# Patient Record
Sex: Female | Born: 1985
Health system: Southern US, Community
[De-identification: ages and names within clinical notes are randomized; demographics above are authoritative.]

## PROBLEM LIST (undated history)

## (undated) DIAGNOSIS — R569 Unspecified convulsions: Secondary | ICD-10-CM

## (undated) DIAGNOSIS — M2550 Pain in unspecified joint: Secondary | ICD-10-CM

## (undated) DIAGNOSIS — G4730 Sleep apnea, unspecified: Secondary | ICD-10-CM

## (undated) DIAGNOSIS — K5900 Constipation, unspecified: Secondary | ICD-10-CM

## (undated) DIAGNOSIS — Q858 Other phakomatoses, not elsewhere classified: Secondary | ICD-10-CM

## (undated) DIAGNOSIS — F319 Bipolar disorder, unspecified: Secondary | ICD-10-CM

## (undated) DIAGNOSIS — J45909 Unspecified asthma, uncomplicated: Secondary | ICD-10-CM

## (undated) DIAGNOSIS — F32A Depression, unspecified: Secondary | ICD-10-CM

## (undated) DIAGNOSIS — G43909 Migraine, unspecified, not intractable, without status migrainosus: Secondary | ICD-10-CM

## (undated) DIAGNOSIS — K219 Gastro-esophageal reflux disease without esophagitis: Secondary | ICD-10-CM

## (undated) DIAGNOSIS — K829 Disease of gallbladder, unspecified: Secondary | ICD-10-CM

## (undated) DIAGNOSIS — F329 Major depressive disorder, single episode, unspecified: Secondary | ICD-10-CM

## (undated) DIAGNOSIS — Q8589 Other phakomatoses, not elsewhere classified: Secondary | ICD-10-CM

## (undated) DIAGNOSIS — IMO0001 Reserved for inherently not codable concepts without codable children: Secondary | ICD-10-CM

## (undated) DIAGNOSIS — F419 Anxiety disorder, unspecified: Secondary | ICD-10-CM

## (undated) DIAGNOSIS — K760 Fatty (change of) liver, not elsewhere classified: Secondary | ICD-10-CM

## (undated) DIAGNOSIS — M7989 Other specified soft tissue disorders: Secondary | ICD-10-CM

## (undated) DIAGNOSIS — R600 Localized edema: Secondary | ICD-10-CM

## (undated) DIAGNOSIS — F4310 Post-traumatic stress disorder, unspecified: Secondary | ICD-10-CM

## (undated) DIAGNOSIS — F3181 Bipolar II disorder: Secondary | ICD-10-CM

## (undated) DIAGNOSIS — L509 Urticaria, unspecified: Secondary | ICD-10-CM

## (undated) HISTORY — DX: Bipolar II disorder: F31.81

## (undated) HISTORY — DX: Constipation, unspecified: K59.00

## (undated) HISTORY — DX: Post-traumatic stress disorder, unspecified: F43.10

## (undated) HISTORY — PX: OVARIAN CYST REMOVAL: SHX89

## (undated) HISTORY — PX: WISDOM TOOTH EXTRACTION: SHX21

## (undated) HISTORY — DX: Sleep apnea, unspecified: G47.30

## (undated) HISTORY — DX: Disease of gallbladder, unspecified: K82.9

## (undated) HISTORY — DX: Other specified soft tissue disorders: M79.89

## (undated) HISTORY — DX: Fatty (change of) liver, not elsewhere classified: K76.0

## (undated) HISTORY — DX: Bipolar disorder, unspecified: F31.9

## (undated) HISTORY — DX: Pain in unspecified joint: M25.50

## (undated) HISTORY — DX: Localized edema: R60.0

## (undated) HISTORY — PX: CHOLECYSTECTOMY: SHX55

---
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
## 2007-02-17 ENCOUNTER — Telehealth (INDEPENDENT_AMBULATORY_CARE_PROVIDER_SITE_OTHER): Payer: Self-pay | Admitting: *Deleted

## 2007-02-22 ENCOUNTER — Ambulatory Visit: Payer: Self-pay | Admitting: Family Medicine

## 2007-02-22 DIAGNOSIS — R109 Unspecified abdominal pain: Secondary | ICD-10-CM | POA: Insufficient documentation

## 2007-02-22 LAB — CONVERTED CEMR LAB
Ketones, urine, test strip: NEGATIVE
Nitrite: NEGATIVE
Protein, U semiquant: NEGATIVE

## 2007-03-27 ENCOUNTER — Encounter: Payer: Self-pay | Admitting: Family Medicine

## 2007-06-27 ENCOUNTER — Emergency Department (HOSPITAL_COMMUNITY): Admission: EM | Admit: 2007-06-27 | Discharge: 2007-06-27 | Payer: Self-pay | Admitting: Emergency Medicine

## 2007-08-15 ENCOUNTER — Telehealth (INDEPENDENT_AMBULATORY_CARE_PROVIDER_SITE_OTHER): Payer: Self-pay | Admitting: *Deleted

## 2007-08-15 ENCOUNTER — Emergency Department (HOSPITAL_COMMUNITY): Admission: EM | Admit: 2007-08-15 | Discharge: 2007-08-15 | Payer: Self-pay | Admitting: Emergency Medicine

## 2007-08-17 ENCOUNTER — Emergency Department (HOSPITAL_COMMUNITY): Admission: EM | Admit: 2007-08-17 | Discharge: 2007-08-17 | Payer: Self-pay | Admitting: Emergency Medicine

## 2007-08-17 ENCOUNTER — Telehealth (INDEPENDENT_AMBULATORY_CARE_PROVIDER_SITE_OTHER): Payer: Self-pay | Admitting: *Deleted

## 2007-08-18 ENCOUNTER — Inpatient Hospital Stay (HOSPITAL_COMMUNITY): Admission: EM | Admit: 2007-08-18 | Discharge: 2007-08-21 | Payer: Self-pay | Admitting: Neurology

## 2007-10-23 ENCOUNTER — Encounter: Payer: Self-pay | Admitting: Family Medicine

## 2007-12-04 ENCOUNTER — Telehealth (INDEPENDENT_AMBULATORY_CARE_PROVIDER_SITE_OTHER): Payer: Self-pay | Admitting: *Deleted

## 2007-12-05 ENCOUNTER — Encounter (INDEPENDENT_AMBULATORY_CARE_PROVIDER_SITE_OTHER): Payer: Self-pay | Admitting: *Deleted

## 2007-12-21 ENCOUNTER — Telehealth (INDEPENDENT_AMBULATORY_CARE_PROVIDER_SITE_OTHER): Payer: Self-pay | Admitting: *Deleted

## 2007-12-21 ENCOUNTER — Emergency Department (HOSPITAL_COMMUNITY): Admission: EM | Admit: 2007-12-21 | Discharge: 2007-12-21 | Payer: Self-pay | Admitting: Emergency Medicine

## 2008-01-10 ENCOUNTER — Encounter: Payer: Self-pay | Admitting: Family Medicine

## 2008-01-10 ENCOUNTER — Ambulatory Visit: Payer: Self-pay | Admitting: Family Medicine

## 2008-01-10 DIAGNOSIS — L509 Urticaria, unspecified: Secondary | ICD-10-CM | POA: Insufficient documentation

## 2008-01-11 ENCOUNTER — Other Ambulatory Visit: Admission: RE | Admit: 2008-01-11 | Discharge: 2008-01-11 | Payer: Self-pay | Admitting: Family Medicine

## 2008-01-12 ENCOUNTER — Encounter (INDEPENDENT_AMBULATORY_CARE_PROVIDER_SITE_OTHER): Payer: Self-pay | Admitting: *Deleted

## 2008-01-12 LAB — CONVERTED CEMR LAB

## 2008-01-15 ENCOUNTER — Telehealth (INDEPENDENT_AMBULATORY_CARE_PROVIDER_SITE_OTHER): Payer: Self-pay | Admitting: *Deleted

## 2008-01-15 ENCOUNTER — Ambulatory Visit: Payer: Self-pay | Admitting: Family Medicine

## 2008-01-15 ENCOUNTER — Encounter (INDEPENDENT_AMBULATORY_CARE_PROVIDER_SITE_OTHER): Payer: Self-pay | Admitting: *Deleted

## 2008-01-15 DIAGNOSIS — Q8589 Other phakomatoses, not elsewhere classified: Secondary | ICD-10-CM | POA: Insufficient documentation

## 2008-01-15 DIAGNOSIS — F70 Mild intellectual disabilities: Secondary | ICD-10-CM | POA: Insufficient documentation

## 2008-01-15 DIAGNOSIS — A5601 Chlamydial cystitis and urethritis: Secondary | ICD-10-CM

## 2008-01-15 DIAGNOSIS — Q858 Other phakomatoses, not elsewhere classified: Secondary | ICD-10-CM

## 2008-01-15 LAB — CONVERTED CEMR LAB
Alkaline Phosphatase: 50 units/L (ref 39–117)
Bilirubin, Direct: 0.1 mg/dL (ref 0.0–0.3)
Chloride: 108 meq/L (ref 96–112)
Eosinophils Absolute: 0.1 10*3/uL (ref 0.0–0.7)
GFR calc Af Amer: 119 mL/min
GFR calc non Af Amer: 98 mL/min
HCT: 39.9 % (ref 36.0–46.0)
HDL: 52.6 mg/dL (ref >39.0)
MCV: 84 fL (ref 78.0–100.0)
Monocytes Absolute: 0.4 10*3/uL (ref 0.1–1.0)
Neutrophils Relative %: 43.5 % (ref 43.0–77.0)
Platelets: 447 10*3/uL — ABNORMAL HIGH (ref 150–400)
Potassium: 3.9 meq/L (ref 3.5–5.1)
RDW: 13.8 % (ref 11.5–14.6)
Sodium: 142 meq/L (ref 135–145)
Total Bilirubin: 0.6 mg/dL (ref 0.3–1.2)
Total CHOL/HDL Ratio: 3.1
Triglycerides: 61 mg/dL (ref 0–149)
VLDL: 12 mg/dL (ref 0–40)

## 2008-01-26 ENCOUNTER — Encounter: Payer: Self-pay | Admitting: Family Medicine

## 2008-02-13 ENCOUNTER — Ambulatory Visit: Payer: Self-pay | Admitting: Family Medicine

## 2008-02-14 ENCOUNTER — Encounter (INDEPENDENT_AMBULATORY_CARE_PROVIDER_SITE_OTHER): Payer: Self-pay | Admitting: *Deleted

## 2008-02-14 LAB — CONVERTED CEMR LAB
Chlamydia, Swab/Urine, PCR: NEGATIVE
GC Probe Amp, Urine: NEGATIVE

## 2008-03-31 ENCOUNTER — Emergency Department (HOSPITAL_COMMUNITY): Admission: EM | Admit: 2008-03-31 | Discharge: 2008-03-31 | Payer: Self-pay | Admitting: Emergency Medicine

## 2008-04-22 ENCOUNTER — Ambulatory Visit: Payer: Self-pay | Admitting: Family Medicine

## 2008-04-23 LAB — CONVERTED CEMR LAB: IgE (Immunoglobulin E), Serum: 132.7 intl units/mL (ref 0.0–180.0)

## 2008-04-24 ENCOUNTER — Telehealth (INDEPENDENT_AMBULATORY_CARE_PROVIDER_SITE_OTHER): Payer: Self-pay | Admitting: *Deleted

## 2008-04-26 ENCOUNTER — Encounter (INDEPENDENT_AMBULATORY_CARE_PROVIDER_SITE_OTHER): Payer: Self-pay | Admitting: *Deleted

## 2008-04-26 LAB — CONVERTED CEMR LAB: IgE (Immunoglobulin E), Serum: 129.5 intl units/mL (ref 0.0–180.0)

## 2008-06-20 ENCOUNTER — Ambulatory Visit: Payer: Self-pay | Admitting: Family Medicine

## 2008-07-24 ENCOUNTER — Ambulatory Visit: Payer: Self-pay | Admitting: Family Medicine

## 2008-07-24 DIAGNOSIS — R51 Headache: Secondary | ICD-10-CM

## 2008-07-24 DIAGNOSIS — R519 Headache, unspecified: Secondary | ICD-10-CM | POA: Insufficient documentation

## 2008-07-24 DIAGNOSIS — F329 Major depressive disorder, single episode, unspecified: Secondary | ICD-10-CM

## 2008-07-24 DIAGNOSIS — F418 Other specified anxiety disorders: Secondary | ICD-10-CM | POA: Insufficient documentation

## 2008-08-16 ENCOUNTER — Telehealth: Payer: Self-pay | Admitting: Family Medicine

## 2008-08-16 ENCOUNTER — Emergency Department (HOSPITAL_COMMUNITY): Admission: EM | Admit: 2008-08-16 | Discharge: 2008-08-16 | Payer: Self-pay | Admitting: Emergency Medicine

## 2008-10-30 ENCOUNTER — Ambulatory Visit: Payer: Self-pay | Admitting: Family Medicine

## 2008-10-30 DIAGNOSIS — J019 Acute sinusitis, unspecified: Secondary | ICD-10-CM | POA: Insufficient documentation

## 2009-01-09 ENCOUNTER — Telehealth (INDEPENDENT_AMBULATORY_CARE_PROVIDER_SITE_OTHER): Payer: Self-pay | Admitting: *Deleted

## 2009-01-10 ENCOUNTER — Ambulatory Visit: Payer: Self-pay | Admitting: Family Medicine

## 2009-01-20 ENCOUNTER — Emergency Department (HOSPITAL_BASED_OUTPATIENT_CLINIC_OR_DEPARTMENT_OTHER): Admission: EM | Admit: 2009-01-20 | Discharge: 2009-01-20 | Payer: Self-pay | Admitting: Emergency Medicine

## 2009-01-20 ENCOUNTER — Ambulatory Visit: Payer: Self-pay | Admitting: Diagnostic Radiology

## 2009-01-31 ENCOUNTER — Emergency Department (HOSPITAL_BASED_OUTPATIENT_CLINIC_OR_DEPARTMENT_OTHER): Admission: EM | Admit: 2009-01-31 | Discharge: 2009-01-31 | Payer: Self-pay | Admitting: Emergency Medicine

## 2009-01-31 ENCOUNTER — Ambulatory Visit: Payer: Self-pay | Admitting: Interventional Radiology

## 2009-03-05 ENCOUNTER — Telehealth: Payer: Self-pay | Admitting: Family Medicine

## 2009-03-05 DIAGNOSIS — L259 Unspecified contact dermatitis, unspecified cause: Secondary | ICD-10-CM | POA: Insufficient documentation

## 2009-03-17 ENCOUNTER — Telehealth: Payer: Self-pay | Admitting: Family Medicine

## 2009-03-25 ENCOUNTER — Encounter: Payer: Self-pay | Admitting: Family Medicine

## 2009-06-03 ENCOUNTER — Emergency Department (HOSPITAL_BASED_OUTPATIENT_CLINIC_OR_DEPARTMENT_OTHER): Admission: EM | Admit: 2009-06-03 | Discharge: 2009-06-03 | Payer: Self-pay | Admitting: Emergency Medicine

## 2009-06-08 ENCOUNTER — Emergency Department (HOSPITAL_BASED_OUTPATIENT_CLINIC_OR_DEPARTMENT_OTHER): Admission: EM | Admit: 2009-06-08 | Discharge: 2009-06-08 | Payer: Self-pay | Admitting: Emergency Medicine

## 2009-06-09 ENCOUNTER — Telehealth (INDEPENDENT_AMBULATORY_CARE_PROVIDER_SITE_OTHER): Payer: Self-pay | Admitting: *Deleted

## 2009-06-10 ENCOUNTER — Other Ambulatory Visit: Admission: RE | Admit: 2009-06-10 | Discharge: 2009-06-10 | Payer: Self-pay | Admitting: Family Medicine

## 2009-06-10 ENCOUNTER — Ambulatory Visit: Payer: Self-pay | Admitting: Family Medicine

## 2009-06-10 DIAGNOSIS — G43009 Migraine without aura, not intractable, without status migrainosus: Secondary | ICD-10-CM | POA: Insufficient documentation

## 2009-06-10 LAB — CONVERTED CEMR LAB
Beta hcg, urine, semiquantitative: NEGATIVE
Bilirubin Urine: NEGATIVE
Blood in Urine, dipstick: NEGATIVE
Glucose, Urine, Semiquant: NEGATIVE
Ketones, urine, test strip: NEGATIVE
Nitrite: NEGATIVE
Protein, U semiquant: NEGATIVE
Specific Gravity, Urine: 1.02
Urobilinogen, UA: 0.2
WBC Urine, dipstick: NEGATIVE
pH: 7

## 2009-06-12 ENCOUNTER — Encounter: Admission: RE | Admit: 2009-06-12 | Discharge: 2009-06-12 | Payer: Self-pay | Admitting: Family Medicine

## 2009-06-16 ENCOUNTER — Encounter (INDEPENDENT_AMBULATORY_CARE_PROVIDER_SITE_OTHER): Payer: Self-pay | Admitting: *Deleted

## 2009-06-17 ENCOUNTER — Telehealth (INDEPENDENT_AMBULATORY_CARE_PROVIDER_SITE_OTHER): Payer: Self-pay | Admitting: *Deleted

## 2009-06-25 ENCOUNTER — Encounter (INDEPENDENT_AMBULATORY_CARE_PROVIDER_SITE_OTHER): Payer: Self-pay | Admitting: *Deleted

## 2009-08-07 ENCOUNTER — Ambulatory Visit: Payer: Self-pay | Admitting: Diagnostic Radiology

## 2009-08-07 ENCOUNTER — Emergency Department (HOSPITAL_BASED_OUTPATIENT_CLINIC_OR_DEPARTMENT_OTHER): Admission: EM | Admit: 2009-08-07 | Discharge: 2009-08-07 | Payer: Self-pay | Admitting: Emergency Medicine

## 2009-08-08 ENCOUNTER — Inpatient Hospital Stay (HOSPITAL_COMMUNITY): Admission: EM | Admit: 2009-08-08 | Discharge: 2009-08-10 | Payer: Self-pay | Admitting: Emergency Medicine

## 2009-08-09 ENCOUNTER — Encounter (INDEPENDENT_AMBULATORY_CARE_PROVIDER_SITE_OTHER): Payer: Self-pay | Admitting: Surgery

## 2009-09-09 ENCOUNTER — Telehealth: Payer: Self-pay | Admitting: Family Medicine

## 2009-09-10 ENCOUNTER — Emergency Department (HOSPITAL_BASED_OUTPATIENT_CLINIC_OR_DEPARTMENT_OTHER): Admission: EM | Admit: 2009-09-10 | Discharge: 2009-09-10 | Payer: Self-pay | Admitting: Emergency Medicine

## 2009-09-10 ENCOUNTER — Ambulatory Visit: Payer: Self-pay | Admitting: Family Medicine

## 2009-09-15 ENCOUNTER — Telehealth (INDEPENDENT_AMBULATORY_CARE_PROVIDER_SITE_OTHER): Payer: Self-pay | Admitting: *Deleted

## 2009-09-15 LAB — CONVERTED CEMR LAB: IgE (Immunoglobulin E), Serum: 67.9 intl units/mL (ref 0.0–180.0)

## 2009-10-14 ENCOUNTER — Emergency Department (HOSPITAL_BASED_OUTPATIENT_CLINIC_OR_DEPARTMENT_OTHER): Admission: EM | Admit: 2009-10-14 | Discharge: 2009-10-14 | Payer: Self-pay | Admitting: Emergency Medicine

## 2009-10-15 ENCOUNTER — Ambulatory Visit: Payer: Self-pay | Admitting: Family Medicine

## 2009-10-15 ENCOUNTER — Encounter (INDEPENDENT_AMBULATORY_CARE_PROVIDER_SITE_OTHER): Payer: Self-pay | Admitting: *Deleted

## 2009-10-15 DIAGNOSIS — R0609 Other forms of dyspnea: Secondary | ICD-10-CM

## 2009-10-15 DIAGNOSIS — R0989 Other specified symptoms and signs involving the circulatory and respiratory systems: Secondary | ICD-10-CM

## 2009-10-26 ENCOUNTER — Emergency Department (HOSPITAL_BASED_OUTPATIENT_CLINIC_OR_DEPARTMENT_OTHER): Admission: EM | Admit: 2009-10-26 | Discharge: 2009-10-27 | Payer: Self-pay | Admitting: Emergency Medicine

## 2009-10-30 ENCOUNTER — Ambulatory Visit: Payer: Self-pay | Admitting: Pulmonary Disease

## 2009-10-30 DIAGNOSIS — G4733 Obstructive sleep apnea (adult) (pediatric): Secondary | ICD-10-CM | POA: Insufficient documentation

## 2009-11-11 ENCOUNTER — Emergency Department (HOSPITAL_BASED_OUTPATIENT_CLINIC_OR_DEPARTMENT_OTHER): Admission: EM | Admit: 2009-11-11 | Discharge: 2009-11-11 | Payer: Self-pay | Admitting: Emergency Medicine

## 2009-11-12 ENCOUNTER — Ambulatory Visit: Payer: Self-pay | Admitting: Family Medicine

## 2009-12-03 ENCOUNTER — Ambulatory Visit (HOSPITAL_BASED_OUTPATIENT_CLINIC_OR_DEPARTMENT_OTHER): Admission: RE | Admit: 2009-12-03 | Discharge: 2009-12-03 | Payer: Self-pay | Admitting: Pulmonary Disease

## 2009-12-03 ENCOUNTER — Telehealth: Payer: Self-pay | Admitting: Family Medicine

## 2009-12-03 ENCOUNTER — Encounter: Payer: Self-pay | Admitting: Pulmonary Disease

## 2009-12-11 ENCOUNTER — Ambulatory Visit: Payer: Self-pay | Admitting: Family Medicine

## 2009-12-14 ENCOUNTER — Ambulatory Visit: Payer: Self-pay | Admitting: Pulmonary Disease

## 2009-12-15 ENCOUNTER — Telehealth (INDEPENDENT_AMBULATORY_CARE_PROVIDER_SITE_OTHER): Payer: Self-pay | Admitting: *Deleted

## 2009-12-15 ENCOUNTER — Emergency Department (HOSPITAL_BASED_OUTPATIENT_CLINIC_OR_DEPARTMENT_OTHER): Admission: EM | Admit: 2009-12-15 | Discharge: 2009-12-16 | Payer: Self-pay | Admitting: Emergency Medicine

## 2009-12-16 ENCOUNTER — Telehealth (INDEPENDENT_AMBULATORY_CARE_PROVIDER_SITE_OTHER): Payer: Self-pay | Admitting: *Deleted

## 2009-12-19 ENCOUNTER — Ambulatory Visit: Payer: Self-pay | Admitting: Pulmonary Disease

## 2010-01-01 ENCOUNTER — Ambulatory Visit: Payer: Self-pay | Admitting: Family Medicine

## 2010-01-14 ENCOUNTER — Ambulatory Visit: Payer: Self-pay | Admitting: Family Medicine

## 2010-01-14 DIAGNOSIS — K219 Gastro-esophageal reflux disease without esophagitis: Secondary | ICD-10-CM

## 2010-01-31 ENCOUNTER — Encounter: Payer: Self-pay | Admitting: Pulmonary Disease

## 2010-02-06 ENCOUNTER — Emergency Department (HOSPITAL_COMMUNITY): Admission: EM | Admit: 2010-02-06 | Discharge: 2010-02-06 | Payer: Self-pay | Admitting: Emergency Medicine

## 2010-03-23 ENCOUNTER — Emergency Department (HOSPITAL_BASED_OUTPATIENT_CLINIC_OR_DEPARTMENT_OTHER): Admission: EM | Admit: 2010-03-23 | Discharge: 2010-03-23 | Payer: Self-pay | Admitting: Emergency Medicine

## 2010-03-23 ENCOUNTER — Encounter: Payer: Self-pay | Admitting: Family Medicine

## 2010-04-19 ENCOUNTER — Encounter: Payer: Self-pay | Admitting: Pulmonary Disease

## 2010-04-30 ENCOUNTER — Ambulatory Visit: Payer: Self-pay | Admitting: Pulmonary Disease

## 2010-06-26 ENCOUNTER — Encounter: Payer: Self-pay | Admitting: Pulmonary Disease

## 2010-07-21 NOTE — Assessment & Plan Note (Signed)
Summary: rov for osa   Visit Type:  Follow-up Copy to:  Loreen Freud Primary Provider/Referring Provider:  Loreen Freud DO  CC:  follow up. pt states she uses her cpap eveyrnight x 7-8 hrs a night. Pt states her mask is tight on her face but she believes that is due to her braides. pt states after she uses her cpap she has dry mouth. .  History of Present Illness: the pt comes in today for f/u of her known osa.  She has not followed up with me since starting cpap, and a recent download from dme showed poor compliance.  She has not had her pressure optimized either.  She is having issues with mask fit, and also is c/o dryness in her nose and throat.  She was not aware of the heater on her humidifier, or how to adjust it.    Current Medications (verified): 1)  Ocella 3-0.03 Mg Tabs (Drospirenone-Ethinyl Estradiol) .... As Directed 2)  Effexor Xr 150 Mg Xr24h-Cap (Venlafaxine Hcl) .Marland Kitchen.. 1 By Mouth Once Daily 3)  Hydroxyzine Pamoate 25 Mg Caps (Hydroxyzine Pamoate) .... Take 1 By Mouth Qid As Needed. 4)  Topamax 50 Mg Tabs (Topiramate) .Marland Kitchen.. 1 By Mouth Two Times A Day 5)  Vicodin Es 7.5-750 Mg Tabs (Hydrocodone-Acetaminophen) .Marland Kitchen.. 1 By Mouth Every 6 Hours As Needed 6)  Pantoprazole Sodium 40 Mg Tbec (Pantoprazole Sodium) .... Once Daily 7)  Cetirizine Hcl 10 Mg Tabs (Cetirizine Hcl) .Marland Kitchen.. 1 Tab At Bedtime  Allergies (verified): 1)  ! Lidocaine-Epinephrine (Lidocaine-Epinephrine) 2)  ! Mepivacaine Hcl (Mepivacaine Hcl) 3)  ! Pcn 4)  ! Benadryl  Review of Systems       The patient complains of shortness of breath with activity and productive cough.  The patient denies shortness of breath at rest, non-productive cough, coughing up blood, chest pain, irregular heartbeats, acid heartburn, indigestion, loss of appetite, weight change, abdominal pain, difficulty swallowing, sore throat, tooth/dental problems, headaches, nasal congestion/difficulty breathing through nose, sneezing, itching, ear ache,  anxiety, depression, hand/feet swelling, joint stiffness or pain, rash, change in color of mucus, and fever.    Vital Signs:  Patient profile:   25 year old female Height:      64 inches Weight:      236.13 pounds BMI:     40.68 O2 Sat:      99 % on Room air Temp:     98.3 degrees F oral Pulse rate:   92 / minute BP sitting:   130 / 78  (left arm) Cuff size:   large  Vitals Entered By: Carver Fila (April 30, 2010 2:46 PM)  O2 Flow:  Room air CC: follow up. pt states she uses her cpap eveyrnight x 7-8 hrs a night. Pt states her mask is tight on her face but she believes that is due to her braides. pt states after she uses her cpap she has dry mouth.  Comments meds and allergies updated Phone number updated Carver Fila  April 30, 2010 2:47 PM    Physical Exam  General:  obese female in nad Nose:  no skin breakdown or pressure necrosis from cpap mask Extremities:  no edema or cyanosis  Neurologic:  alert, does not appear sleepy, moves all 4.   Impression & Recommendations:  Problem # 1:  OBSTRUCTIVE SLEEP APNEA (ICD-327.23) the pt is doing poorly with her cpap, and her recently download shows compliance less than 50%.  She is having issues with mask fit, lack of humidity  in the system, and a general poor understanding of the machine functions.  We have never optimized her pressure for her due to noncompliance with followup.  At this point, would like to have her go over to sleep center for mask fitting, and will also need some education about her machine from the DME.  Will use the auto mode on her machine to optimize pressure for her.  I have also asked her to work on aggressive weight loss.  Other Orders: Est. Patient Level III ([address]) Sleep Disorder Referral (Sleep Disorder) DME Referral (DME)  Patient Instructions: 1)  will have your mask fitted over at the sleep center during the day...please bring your current mask with you to the visit. 2)  will have your machine  flipped over to the automatic mode to optimize your pressure for you.  Will call you with results. 3)  increase the heat on your humidifier in order to help increase moisture. 4)  work on weight loss 5)  followup with me in 6mos.

## 2010-07-21 NOTE — Letter (Signed)
Summary: Patient No Show/[facility]  Patient No Show/[facility]   Imported By: Lanelle Bal 03/30/2010 14:03:14  _____________________________________________________________________  External Attachment:    Type:   Image     Comment:   External Document

## 2010-07-21 NOTE — Progress Notes (Signed)
Summary: Meds/Recheck?   Phone Note Call from Patient Call back at [phone]   Caller: Patient Summary of Call: Pt called and left a voicemail regarding her prescription stating she needed refills. Pt did not leave which medication. I called the pt back and had to leave a message. We were also never able to get intouch with pt about her pap results in December. Hopp advised then for [place] to prescribe Metronidazole 250 mg three times a day #21, avoid alcohol. When I get intouch with the pt should I call in the medication, or do you need to recheck? Please advise. Army Fossa CMA  September 09, 2009 11:23 AM   Follow-up for Phone Call        yes--call in med--- Follow-up by: Loreen Freud DO,  September 09, 2009 12:14 PM  Additional Follow-up for Phone Call Additional follow up Details #1::        I discussed with the pt and she need Fexofinadine 180 mg, sent the pharm. Also made the pt aware of her pap results and sent in meds. Army Fossa CMA  September 09, 2009 1:18 PM     New/Updated Medications: METRONIDAZOLE 250 MG TABS (METRONIDAZOLE) 1 by mouth three times a day, avoid alcohol. Prescriptions: METRONIDAZOLE 250 MG TABS (METRONIDAZOLE) 1 by mouth three times a day, avoid alcohol.  #21 x 0   Entered by:   Army Fossa CMA   Authorized by:   Loreen Freud DO   Signed by:   Army Fossa CMA on 09/09/2009   Method used:   Electronically to        [facility] [address] (retail)       [phone]       [place], [place]  [address]       Ph: [phone]       Fax: [phone]   RxID:   [id number]

## 2010-07-21 NOTE — Progress Notes (Signed)
Summary: lab results  Phone Note Outgoing Call   Call placed by: [facility] CMA,  September 15, 2009 3:29 PM Summary of Call: left message to call office, letter mailed .................Marland KitchenFelecia Deloach CMA  September 15, 2009 3:29 PM   Pt allergic to mold and [place] grass.  left message to call office...............Marland KitchenFelecia Deloach CMA  September 17, 2009 8:42 AM   Follow-up for Phone Call        pt aware labs mailed...........Marland KitchenFelecia Deloach CMA  September 17, 2009 10:05 AM

## 2010-07-21 NOTE — Assessment & Plan Note (Signed)
Summary: TROUBLE SLEEPING/RH........Marland Kitchen   Vital Signs:  Patient profile:   25 year old female Weight:      235 pounds Pulse rate:   90 / minute Pulse rhythm:   regular BP sitting:   122 / 80  (left arm) Cuff size:   large  Vitals Entered By: Army Fossa CMA (October 15, 2009 11:06 AM) CC: Pt here to discuss sleeping issues, cannot stay asleep wakes up because she is having trouble breathing.    History of Present Illness: Pt here c/o snoring and not sleeping well.  She periodically wakes up at night and this am her father had to wake her up because she stopped breathing.  Pt still getting periodic headaches.   No other complaints.    Preventive Screening-Counseling & Management  Alcohol-Tobacco     Alcohol drinks/day: 0     Smoking Status: never  Caffeine-Diet-Exercise     Caffeine use/day: 1     Does Patient Exercise: yes     Type of exercise: gym     Exercise (avg: min/session): >60     Times/week: 2  Current Medications (verified): 1)  Fexofenadine Hcl 180 Mg  Tabs (Fexofenadine Hcl) .... Take One Tablet Daily 2)  Cimetidine 400 Mg  Tabs (Cimetidine) .... Take One Tablet Daily Can Take Up To 3 Daily 3)  Ocella 3-0.03 Mg Tabs (Drospirenone-Ethinyl Estradiol) .... As Directed 4)  Cymbalta 60 Mg Cpep (Duloxetine Hcl) .Marland Kitchen.. 1 By Mouth Once Daily 5)  Abilify 5 Mg Tabs (Aripiprazole) .Marland Kitchen.. 1 By Mouth Once Daily**office Visit Due Now** 6)  Endocet 10-650 Mg Tabs (Oxycodone-Acetaminophen) .Marland Kitchen.. 1 By Mouth Every 6h  Allergies: 1)  ! Lidocaine-Epinephrine (Lidocaine-Epinephrine) 2)  ! Mepivacaine Hcl (Mepivacaine Hcl) 3)  ! Pcn 4)  ! Benadryl  Past History:  Past medical, surgical, family and social histories (including risk factors) reviewed for relevance to current acute and chronic problems.  Past Medical History: Reviewed history from 07/24/2008 and no changes required. 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 Had ophtho exam-no evid glaucoma - 12/25/2003  Family History: Reviewed history from 01/10/2008 and no changes required. Family History Diabetes  --MGM Family History Lung cancer MGM---copd--- worked in [employer] Family History High cholesterol Family History Hypertension  Social History: Reviewed history from 06/10/2009 and no changes required. Lives with mother, father, and younger sister. 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 Lungs:  Normal respiratory effort, chest expands symmetrically. Lungs are clear to auscultation, no crackles or wheezes. Heart:  Normal rate and regular rhythm. S1 and S2 normal without gallop, murmur, click, rub or other extra sounds. Extremities:  No clubbing, cyanosis, edema, or deformity noted with normal full range of motion of all joints.   Psych:  Cognition and judgment appear intact. Alert and cooperative with normal attention span and concentration. No apparent delusions, illusions, hallucinations   Impression & Recommendations:  Problem # 1:  SLEEP APNEA (ICD-780.57)  Orders: Sleep Disorder Referral (Sleep Disorder)  CPAP (cm  H20):   Mask Type:   Mask Size:   Problem # 2:  SNORING (ICD-786.09)  Orders: Sleep Disorder Referral (Sleep Disorder)  Recommended fluid and salt restriction.   Problem # 3:  MORBID OBESITY (ICD-278.01) Pt is eating  better and exercising interested in bariatric surgery  Orders: Nutrition Referral (Nutrition)  Ht: 60 (06/10/2009)   Wt: 235 (10/15/2009)   BMI: 45.87 (06/10/2009)  Complete Medication List: 1)  Fexofenadine Hcl 180 Mg Tabs (Fexofenadine hcl) .... Take one tablet daily 2)  Cimetidine 400 Mg Tabs (Cimetidine) .... Take one tablet daily can take up to 3 daily 3)  Ocella 3-0.03 Mg Tabs (Drospirenone-ethinyl estradiol) .... As directed 4)  Cymbalta 60 Mg Cpep (Duloxetine hcl) .Marland Kitchen.. 1 by mouth once daily 5)  Abilify 5 Mg Tabs (Aripiprazole) .Marland Kitchen.. 1 by mouth once daily**office visit due now** 6)  Endocet 10-650 Mg Tabs (Oxycodone-acetaminophen) .Marland Kitchen.. 1 by mouth every 6h

## 2010-07-21 NOTE — Progress Notes (Signed)
Summary: PRIOR AUTH  Phone Note Refill Request   Refills Requested: Medication #1:  PROTONIX 40MG  TAKE ONE TAB BY MOUTH EVERY Richelle Ito - [id number]  Initial call taken by: Okey Regal [place],  December 16, 2009 4:08 PM  Follow-up for Phone Call        prior auth approved as of 12-17-09, pharmacy notified, left pt detail message med approved rx ready to be pick-up at pharmacy..............Marland KitchenFelecia Deloach CMA  December 17, 2009 3:18 PM

## 2010-07-21 NOTE — Progress Notes (Signed)
Summary: REFILL  Phone Note Refill Request Message from:  Fax from Pharmacy on December 03, 2009 11:37 AM  Refills Requested: Medication #1:  HYDROXYZ HCL 25 ML TAKE ONE TAB BY MOUTH 4 TIMES DAILY AS NEEDED FOR Gina Cameron [id number]  Initial call taken by: Okey Regal [place],  December 03, 2009 11:47 AM  Follow-up for Phone Call        ok to refill x1  5 refills  Follow-up by: Loreen Freud DO,  December 03, 2009 12:09 PM    New/Updated Medications: HYDROXYZINE PAMOATE 25 MG CAPS (HYDROXYZINE PAMOATE) take 1 by mouth qid as needed. Prescriptions: HYDROXYZINE PAMOATE 25 MG CAPS (HYDROXYZINE PAMOATE) take 1 by mouth qid as needed.  #60 x 5   Entered by:   Army Fossa CMA   Authorized by:   Loreen Freud DO   Signed by:   Army Fossa CMA on 12/03/2009   Method used:   Electronically to        [facility] [address] (retail)       [phone]       [place], [place]  [address]       Ph: [phone]       Fax: [phone]   RxID:   [id number]

## 2010-07-21 NOTE — Assessment & Plan Note (Signed)
Summary: ov to discuss sleep study results/mg   Copy to:  Loreen Freud Primary Provider/Referring Provider:  Loreen Freud DO  CC:  Follow up to discuss sleep study results.  .  History of Present Illness: the pt comes in today for f/u of her recent sleep study.  She was found to have an AHI of 97/hr with desats as low as 72%.  I have reviewed the sleep study in detail with the pt and her father, and have answered all of their questions.    Current Medications (verified): 1)  Fexofenadine Hcl 180 Mg  Tabs (Fexofenadine Hcl) .... Take One Tablet Daily 2)  Cimetidine 400 Mg  Tabs (Cimetidine) .... Take One Tablet Daily Can Take Up To 3 Daily 3)  Ocella 3-0.03 Mg Tabs (Drospirenone-Ethinyl Estradiol) .... As Directed 4)  Effexor Xr 37.5 Mg Xr24h-Cap (Venlafaxine Hcl) .Marland Kitchen.. 1 By Mouth Once Daily For 1 Week Then 2 By Mouth Once Daily 5)  Hydroxyzine Pamoate 25 Mg Caps (Hydroxyzine Pamoate) .... Take 1 By Mouth Qid As Needed. 6)  Topamax 25 Mg Tabs (Topiramate) .Marland Kitchen.. 1 By Mouth At Bedtime For 1 Week Then 1 By Mouth Two Times A Day For 1 Week Then 1 By Mouth Qam and 2 By Mouth Qpm 7)  Vicodin Es 7.5-750 Mg Tabs (Hydrocodone-Acetaminophen) .Marland Kitchen.. 1 By Mouth Every 6 Hours As Needed  Allergies (verified): 1)  ! Lidocaine-Epinephrine (Lidocaine-Epinephrine) 2)  ! Mepivacaine Hcl (Mepivacaine Hcl) 3)  ! Pcn 4)  ! Benadryl  Review of Systems  The patient denies shortness of breath with activity, shortness of breath at rest, productive cough, non-productive cough, coughing up blood, chest pain, irregular heartbeats, acid heartburn, indigestion, loss of appetite, weight change, abdominal pain, difficulty swallowing, sore throat, tooth/dental problems, headaches, nasal congestion/difficulty breathing through nose, sneezing, itching, ear ache, anxiety, depression, hand/feet swelling, joint stiffness or pain, rash, change in color of mucus, and fever.    Vital Signs:  Patient profile:   25 year old  female Height:      64 inches Weight:      232 pounds BMI:     39.97 O2 Sat:      98 % on Room air Temp:     99.2 degrees F oral Pulse rate:   95 / minute BP sitting:   124 / 82  (left arm) Cuff size:   regular  Vitals Entered By: Gweneth Dimitri RN (December 19, 2009 3:18 PM)  O2 Flow:  Room air CC: Follow up to discuss sleep study results.   Comments Medications reviewed with patient Daytime contact number verified with patient. Gweneth Dimitri RN  December 19, 2009 3:19 PM    Physical Exam  General:  obese female in nad Nose:  no purulence or drainage noted. Extremities:  no edema or cyanosis Neurologic:  alert, but clearly sleepy, moves all 4.   Impression & Recommendations:  Problem # 1:  OBSTRUCTIVE SLEEP APNEA (ICD-327.23) the pt has very severe osa by her recent sleep study with significant oxygen desaturation.  I have reviewed the various treatment options, but feel that cpap with weight loss give her the best chance of success.  She is willing to give this a try.  I will set the patient up on cpap at a moderate pressure level to allow for desensitization, and will troubleshoot the device over the next 4-6weeks if needed.  The pt is to call me if having issues with tolerance.  Will then optimize the pressure once  patient is able to wear cpap on a consistent basis.  Other Orders: Est. Patient Level III (95621) DME Referral (DME)  Patient Instructions: 1)  will start on cpap at moderate pressure level.  Please call if having issues with tolerance. 2)  work on weight loss. 3)  followup with me in 5weeks.

## 2010-07-21 NOTE — Letter (Signed)
Summary: CMN/[facility]  CMN/[facility]   Imported By: Lester [place] 02/06/2010 09:41:19  _____________________________________________________________________  External Attachment:    Type:   Image     Comment:   External Document

## 2010-07-21 NOTE — Assessment & Plan Note (Signed)
Summary: consult for possible osa   Copy to:  Loreen Freud Primary Provider/Referring Provider:  Loreen Freud DO  CC:  Sleep Consult.  History of Present Illness: the pt is a 25y/o female who I have been asked to see for possible osa.  She has been noted to have loud snoring by her family members, as well as pauses in her breathing during sleep.  She also describes choking arousals.  She goes to bed at 11pm, and arises 5am to 1pm.  She is not rested upon arising.  She notes significant EDS with any period of inactivity, and will often doze off in class and snore.  She also dozes with tv watching, but denies sleepiness with driving.  Her epworth score today is 11, and her weight is up 20 pounds over the last 2 years.    Medications Prior to Update: 1)  Fexofenadine Hcl 180 Mg  Tabs (Fexofenadine Hcl) .... Take One Tablet Daily 2)  Cimetidine 400 Mg  Tabs (Cimetidine) .... Take One Tablet Daily Can Take Up To 3 Daily 3)  Ocella 3-0.03 Mg Tabs (Drospirenone-Ethinyl Estradiol) .... As Directed 4)  Cymbalta 60 Mg Cpep (Duloxetine Hcl) .Marland Kitchen.. 1 By Mouth Once Daily 5)  Abilify 5 Mg Tabs (Aripiprazole) .Marland Kitchen.. 1 By Mouth Once Daily**office Visit Due Now** 6)  Endocet 10-650 Mg Tabs (Oxycodone-Acetaminophen) .Marland Kitchen.. 1 By Mouth Every 6h  Allergies (verified): 1)  ! Lidocaine-Epinephrine (Lidocaine-Epinephrine) 2)  ! Mepivacaine Hcl (Mepivacaine Hcl) 3)  ! Pcn 4)  ! 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 Family History Diabetes  --MGM Family History Lung cancer MGM---copd--- worked in [employer] Family History High cholesterol Family History Hypertension allergies: mother, father, sisters, brothers  Social History: Reviewed history from 06/10/2009 and no changes required. Lives with mother, father, and younger sister. 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  The patient denies shortness of breath with activity, shortness of breath at rest, productive cough, non-productive cough, coughing up blood, chest pain, irregular heartbeats, acid heartburn, indigestion, loss of appetite, abdominal pain, difficulty swallowing, sore throat, tooth/dental problems, nasal congestion/difficulty breathing through nose, sneezing, itching, ear ache, anxiety, hand/feet swelling, joint stiffness or pain, rash, change in color of mucus, and fever.    Vital Signs:  Patient profile:   25 year old female Height:      64 inches Weight:      233 pounds BMI:     40.14 O2 Sat:      98 % on Room air Temp:     98.3 degrees F oral Pulse rate:   85 / minute BP sitting:   120 / 76  (right arm) Cuff size:   large  Vitals Entered By: Arman Filter LPN (Oct 30, 2009 2:09 PM)  O2 Flow:  Room air CC: Sleep Consult Comments Medications reviewed with patient Arman Filter LPN  Oct 30, 2009 2:16 PM    Physical Exam  General:  obese female in nad Eyes:  PERRLA and EOMI.   Nose:  mild turbinate hypertrophy, no obstruction Mouth:  large tonsils, elongation of soft palate and uvula with significant obstruction posteriorly Neck:  no jvd, tmg, LN Lungs:  clear to auscultation Heart:  rrr, no mrg Abdomen:  soft and nontender, bs+ Extremities:  no edema or cyanosis,pulses intact distally Neurologic:  alert and oriented, moves all  4. appears sleepy   Impression & Recommendations:  Problem # 1:  OBSTRUCTIVE SLEEP APNEA (ICD-327.23) the pt's history is very suggestive of significant osa.  She is overweight, has abnormal upper airway anatomy, and is significantly sleepy with inactivity.  I have had a long discussion with the pt about sleep apnea, including its impact on her QOL and CV health.  She needs a sleep study for diagnosis, and is agreeable.  I have reminded her of her moral responsibility to not drive if she is sleepy.  Other Orders: Consultation Level IV ([address]109) Sleep Disorder Referral (Sleep Disorder)  Patient Instructions: 1)  will schedule for sleep study, and arrange followup once results are available. 2)  work on weight loss

## 2010-07-21 NOTE — Assessment & Plan Note (Signed)
Summary: rto 1 month/cbs   Vital Signs:  Patient profile:   25 year old female Height:      64 inches Weight:      228 pounds Temp:     98.8 degrees F oral Pulse rate:   72 / minute BP sitting:   110 / 82  (left arm)  Vitals Entered By: Jeremy Johann CMA (January 14, 2010 11:16 AM) CC: 1 f/u month, refills, discuss sleep apnea, change med   History of Present Illness: Pt here to discuss depression.  She is doing better but feels she could still be better.  Pt is not suicidal.  Father is also present and they need refills on some meds and had ? about OSA.  Pt is still sleeping in day and up all night.   Father is concerned she is just "not getting it".    Current Medications (verified): 1)  Ocella 3-0.03 Mg Tabs (Drospirenone-Ethinyl Estradiol) .... As Directed 2)  Effexor Xr 150 Mg Xr24h-Cap (Venlafaxine Hcl) .Marland Kitchen.. 1 By Mouth Once Daily 3)  Hydroxyzine Pamoate 25 Mg Caps (Hydroxyzine Pamoate) .... Take 1 By Mouth Qid As Needed. 4)  Topamax 50 Mg Tabs (Topiramate) .Marland Kitchen.. 1 By Mouth Two Times A Day 5)  Vicodin Es 7.5-750 Mg Tabs (Hydrocodone-Acetaminophen) .Marland Kitchen.. 1 By Mouth Every 6 Hours As Needed 6)  Pantoprazole Sodium 40 Mg Tbec (Pantoprazole Sodium) .... Once Daily 7)  Cetirizine Hcl 10 Mg Tabs (Cetirizine Hcl) .Marland Kitchen.. 1 Tab At Bedtime  Allergies (verified): 1)  ! Lidocaine-Epinephrine (Lidocaine-Epinephrine) 2)  ! Mepivacaine Hcl (Mepivacaine Hcl) 3)  ! Pcn 4)  ! Benadryl  Past History:  Past medical, surgical, family and social histories (including risk factors) reviewed for relevance to current acute and chronic problems.  Past Medical History: Reviewed history from 10/30/2009 and no changes required. 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 Had ophtho exam-no evid glaucoma - 12/25/2003 cholecystectomy 2011 wisdom teeth extracted at age 46  Family History: Reviewed history from 10/30/2009 and no changes required. Family History Diabetes  --MGM Family History Lung cancer MGM---copd--- worked in [employer] Family History High cholesterol Family History Hypertension allergies: mother, father, sisters, brothers  Social History: Reviewed history from 10/30/2009 and no changes required. Lives with mother, father, and younger sister. 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 Lungs are clear to auscultation, no crackles or wheezes. Heart:  Normal rate and regular rhythm. S1 and S2 normal without gallop, murmur, click, rub or other extra sounds. Skin:  Intact without suspicious lesions or rashes Psych:  Oriented X3, memory intact for recent and remote, normally interactive, good eye contact, not anxious appearing, not depressed appearing, and not suicidal.     Impression & Recommendations:  Problem # 1:  OBSTRUCTIVE SLEEP APNEA (ICD-327.23) cpap per pulm encouraged pt to sleep at night and stay awake during day encouraged exercise and diet  Problem # 2:  MORBID OBESITY (ICD-278.01)  Ht: 64 (01/14/2010)   Wt: 228 (01/14/2010)   BMI:  39.97 (12/19/2009)  Problem # 3:  COMMON MIGRAINE (ICD-346.10)  con't topamax  Her updated medication list for this problem includes:    Vicodin Es 7.5-750 Mg Tabs (Hydrocodone-acetaminophen) .Marland Kitchen... 1 by mouth every 6 hours as needed  Problem # 4:  DEPRESSION (ICD-311)  Her  updated medication list for this problem includes:    Effexor Xr 150 Mg Xr24h-cap (Venlafaxine hcl) .Marland Kitchen... 1 by mouth once daily    Hydroxyzine Pamoate 25 Mg Caps (Hydroxyzine pamoate) .Marland Kitchen... Take 1 by mouth qid as needed.  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 As directed 2)  Effexor Xr 150 Mg Xr24h-cap (Venlafaxine hcl) .Marland Kitchen.. 1 by mouth once daily 3)  Hydroxyzine Pamoate 25 Mg Caps (Hydroxyzine pamoate) .... Take 1 by mouth qid as needed. 4)  Topamax 50 Mg Tabs (Topiramate) .Marland Kitchen.. 1 by mouth two times a day 5)  Vicodin Es 7.5-750 Mg Tabs (Hydrocodone-acetaminophen) .Marland Kitchen.. 1 by mouth every 6 hours as needed 6)  Pantoprazole Sodium 40 Mg Tbec (Pantoprazole sodium) .... Once daily 7)  Cetirizine Hcl 10 Mg Tabs (Cetirizine hcl) .Marland Kitchen.. 1 tab at bedtime  Patient Instructions: 1)  Please schedule a follow-up appointment in 1 month.  Prescriptions: TOPAMAX 50 MG TABS (TOPIRAMATE) 1 by mouth two times a day  #60 x 5   Entered and Authorized by:   Loreen Freud DO   Signed by:   Loreen Freud DO on 01/14/2010   Method used:   Electronically to        [facility] [address] (retail)       [phone]       [place], [place]  [address]       Ph: [phone]       Fax: [phone]   RxID:   [id number] EFFEXOR XR 150 MG XR24H-CAP (VENLAFAXINE HCL) 1 by mouth once daily  #30 x 2   Entered and Authorized by:   Loreen Freud DO   Signed by:   Loreen Freud DO on 01/14/2010   Method used:   Electronically to        [facility] [address] (retail)       [phone]       [place], [place]  [address]       Ph: [phone]       Fax: [phone]   RxID:   [id number] PANTOPRAZOLE SODIUM 40 MG TBEC (PANTOPRAZOLE SODIUM) once daily  #30 x 11   Entered and Authorized by:   Loreen Freud DO   Signed by:   Loreen Freud  DO on 01/14/2010   Method used:   Electronically to        [facility] [address] (retail)       [phone]       [place], [place]  [address]       Ph: [phone]       Fax: [phone]   RxID:   [id number]

## 2010-07-21 NOTE — Miscellaneous (Signed)
  Clinical Lists Changes  Recent download shows poor compliance, with 35/89 days not worn. needs ov since never followed up once cpap started.  Appended Document:  pt needs ov with me to f/u her osa and cpap  Appended Document:  pt mother scheduled apt for 11/9 at 9:00

## 2010-07-21 NOTE — Assessment & Plan Note (Signed)
Summary: weaps  all over her body/kdc   Vital Signs:  Patient profile:   25 year old female Weight:      237 pounds Temp:     98.9 degrees F oral Pulse rate:   88 / minute Pulse rhythm:   regular BP sitting:   122 / 80  (left arm) Cuff size:   large  Vitals Entered By: Army Fossa CMA (September 10, 2009 10:50 AM) CC: Pt here because she states she has hives on her body, her medication is not working.    Allergies: 1)  ! Lidocaine-Epinephrine (Lidocaine-Epinephrine) 2)  ! Mepivacaine Hcl (Mepivacaine Hcl) 3)  ! Pcn 4)  ! Benadryl  Physical Exam  General:  Well-developed,well-nourished,in no acute distress; alert,appropriate and cooperative throughout examination Skin:  + hyperpigmentation bend of elbows no hives together Psych:  Oriented X3 and normally interactive.     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 Take one tablet daily 2)  Cimetidine 400 Mg Tabs (Cimetidine) .... Take one tablet daily can take up to 3 daily 3)  Ocella 3-0.03 Mg Tabs (Drospirenone-ethinyl estradiol) .... As directed 4)  Cymbalta 60 Mg Cpep (Duloxetine hcl) .Marland Kitchen.. 1 by mouth once daily 5)  Abilify 5 Mg Tabs (Aripiprazole) .Marland Kitchen.. 1 by mouth once daily**office visit due now** 6)  Endocet 10-650 Mg Tabs (Oxycodone-acetaminophen) .Marland Kitchen.. 1 by mouth every 6h 7)  Topamax 50 Mg Tabs (Topiramate) .Marland Kitchen.. 1 by mouth two times a day 8)  Metronidazole 250 Mg Tabs (Metronidazole) .Marland Kitchen.. 1 by mouth three times a day, avoid alcohol. 9)  Zyrtec Allergy 10 Mg Tabs (Cetirizine hcl) .Marland Kitchen.. 1 by mouth at bedtime as needed Prescriptions: ZYRTEC ALLERGY 10 MG TABS (CETIRIZINE HCL) 1 by mouth at bedtime as needed  #30  x 11   Entered and Authorized by:   Loreen Freud DO   Signed by:   Loreen Freud DO on 09/10/2009   Method used:   Electronically to        [facility] [address] (retail)       [phone]       [place], [place]  [address]       Ph: [phone]       Fax: [phone]   RxID:   [id number]

## 2010-07-21 NOTE — Progress Notes (Signed)
Summary: need to sched ov with kc   ---- Converted from flag ---- ---- 12/15/2009 12:16 PM, Arman Filter LPN wrote: pt needs ov with kc to discuss sleep study results. ------------------------------  [id number].  Aundra Millet Berneice Zettlemoyer LPN  December 15, 2009 12:16 PM   called and spoke with pt's mother.  mother scheduled pt to see kc for a f/u on 12-19-2009 at 3:15pm  Arman Filter LPN  December 17, 2009 10:33 AM

## 2010-07-21 NOTE — Letter (Signed)
Summary: Primary Care Consult Scheduled Letter  [facility] [place]  [address] [place], [place] [address]   Phone: [phone]  Fax: [phone]      10/15/2009 MRN: [id number]  [facility] [address] [place], [place]  [address]    Dear Gina Cameron,    We have scheduled an appointment for you.  At the recommendation of Dr. Loreen Freud, we have scheduled you a consult with Dr. Marcelyn Bruins of [facility] on 10-30-2009 at 2:15pm.  Their address is [address], [address] floor, [place] [address]. The office phone number is [phone].  If this appointment day and time is not convenient for you, please feel free to call the office of the doctor you are being referred to at the number listed above and reschedule the appointment.    It is important for you to keep your scheduled appointments. We are here to make sure you are given good patient care.   Thank you,    Renee, Patient Care Coordinator [facility]    ****IF YOU ARE UNABLE TO KEEP THIS APPOINTMENT OR NEED TO RESCHEDULE, PLEASE GIVE 24 HOUR NOTICE TO [facility] TO AVOID A $50 FEE****

## 2010-07-21 NOTE — Letter (Signed)
Summary: Unable To Reach-Consult Scheduled  [facility] [place]  [address] [place], [place] [address]   Phone: [phone]  Fax: [phone]    06/25/2009 MRN: [id number]    Dear Ms. Becherer,   We have been unable to reach you by phone.  Please contact our office with an updated phone number.      Thank you,  Army Fossa CMA  June 25, 2009 2:06 PM
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
## 2010-07-21 NOTE — Assessment & Plan Note (Signed)
Summary: sore throat/cbs   Vital Signs:  Patient profile:   25 year old female Weight:      232 pounds Temp:     99.2 degrees F oral BP sitting:   120 / 80  (left arm)  Vitals Entered By: Doristine Devoid CMA (January 01, 2010 2:48 PM) CC: sore throat x2 days "says she has hole in throat"   History of Present Illness: 25 yo woman here today for sore throat.  'i have a hole in my throat'.  'i found the hole 2 days ago'.  no fevers.  no nasal congestion.  no ear pain.  no cough.  no known sick contacts.  Current Medications (verified): 1)  Fexofenadine Hcl 180 Mg  Tabs (Fexofenadine Hcl) .... Take One Tablet Daily 2)  Cimetidine 400 Mg  Tabs (Cimetidine) .... Take One Tablet Daily Can Take Up To 3 Daily 3)  Ocella 3-0.03 Mg Tabs (Drospirenone-Ethinyl Estradiol) .... As Directed 4)  Effexor Xr 37.5 Mg Xr24h-Cap (Venlafaxine Hcl) .Marland Kitchen.. 1 By Mouth Once Daily For 1 Week Then 2 By Mouth Once Daily 5)  Hydroxyzine Pamoate 25 Mg Caps (Hydroxyzine Pamoate) .... Take 1 By Mouth Qid As Needed. 6)  Topamax 25 Mg Tabs (Topiramate) .Marland Kitchen.. 1 By Mouth At Bedtime For 1 Week Then 1 By Mouth Two Times A Day For 1 Week Then 1 By Mouth Qam and 2 By Mouth Qpm 7)  Vicodin Es 7.5-750 Mg Tabs (Hydrocodone-Acetaminophen) .Marland Kitchen.. 1 By Mouth Every 6 Hours As Needed  Allergies (verified): 1)  ! Lidocaine-Epinephrine (Lidocaine-Epinephrine) 2)  ! Mepivacaine Hcl (Mepivacaine Hcl) 3)  ! Pcn 4)  ! 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 No apparent alopecia or balding.  no TTP over sinuses Eyes:  no injxn or inflammation Ears:  External ear exam shows no significant lesions or deformities.  Otoscopic examination reveals clear canals, tympanic membranes are intact bilaterally without bulging, retraction, inflammation or discharge. Hearing is grossly normal bilaterally. Nose:  External nasal examination shows no deformity or inflammation. Nasal mucosa are pink and moist without lesions or exudates. Mouth:  mild tonsilar enlargement and erythema.  no exudates. Neck:  No deformities, masses, or tenderness noted.supple.   Lungs:  Normal respiratory effort, chest expands symmetrically. Lungs are clear to auscultation, no crackles or wheezes. Heart:  normal rate and no murmur.     Impression & Recommendations:  Problem # 1:  PHARYNGITIS, ACUTE (ICD-462) Assessment Unchanged rapid strep (-).  most likely viral as no evidence of bacterial infxn on exam.  provided pt reassurance that there was no 'hole' visible.  reviewed supportive care and red flags that should prompt return.  Pt expresses understanding and is in agreement w/ this plan. Orders: Rapid Strep (84132)  Complete Medication List: 1)  Fexofenadine Hcl 180 Mg Tabs (Fexofenadine hcl) .... Take one tablet daily 2)  Cimetidine 400 Mg Tabs (Cimetidine) .... Take one tablet daily can take up to 3 daily 3)  Ocella 3-0.03 Mg Tabs (Drospirenone-ethinyl estradiol) .... As directed 4)  Effexor Xr 37.5  Mg Xr24h-cap (Venlafaxine hcl) .Marland Kitchen.. 1 by mouth once daily for 1 week then 2 by mouth once daily 5)  Hydroxyzine Pamoate 25 Mg Caps (Hydroxyzine pamoate) .... Take 1 by mouth qid as needed. 6)  Topamax 25 Mg Tabs (Topiramate) .Marland Kitchen.. 1 by mouth at bedtime for 1 week then 1 by mouth two times a day for 1 week then 1 by mouth qam and 2 by mouth qpm 7)  Vicodin Es 7.5-750 Mg Tabs  (Hydrocodone-acetaminophen) .Marland Kitchen.. 1 by mouth every 6 hours as needed  Patient Instructions: 1)  This appears to be a virus and should improve with time 2)  I don't see any hole in your throat- just the normal grooves in your tonsils that are more noticeable when they swell 3)  Drink plenty of fluids 4)  Ibuprofen as needed for pain/fever 5)  If no improvement in the next 7 days, please call 6)  Hang in there!  Appended Document: sore throat/cbs    Clinical Lists Changes  Observations: Added new observation of RAPID STREP: negative (01/01/2010 17:03)      Laboratory Results    Other Tests  Rapid Strep: negative  Kit Test Internal QC: Positive   (Normal Range: Negative)

## 2010-07-23 NOTE — Letter (Signed)
Summary: CMN & Prior Approval for DME  / [facility]  CMN & Prior Approval for DME  / [facility]   Imported By: Lennie Odor 07/03/2010 09:25:30  _____________________________________________________________________  External Attachment:    Type:   Image     Comment:   External Document

## 2010-07-25 ENCOUNTER — Emergency Department (HOSPITAL_COMMUNITY)
Admission: EM | Admit: 2010-07-25 | Discharge: 2010-07-25 | Disposition: A | Discharge status: Home / Self Care | Payer: Medicaid Other | Attending: Emergency Medicine | Admitting: Emergency Medicine

## 2010-07-25 DIAGNOSIS — G43909 Migraine, unspecified, not intractable, without status migrainosus: Secondary | ICD-10-CM | POA: Insufficient documentation

## 2010-08-09 ENCOUNTER — Encounter: Payer: Self-pay | Admitting: Pulmonary Disease

## 2010-08-18 NOTE — Miscellaneous (Signed)
Summary: optimal pressure 14cm on auto  Clinical Lists Changes  Orders: Added new Referral order of DME Referral (DME) - Signed optimal pressure 14cm, great compliance

## 2010-08-23 ENCOUNTER — Emergency Department (HOSPITAL_COMMUNITY)
Admission: EM | Admit: 2010-08-23 | Discharge: 2010-08-23 | Disposition: A | Discharge status: Home / Self Care | Payer: Medicaid Other | Attending: Emergency Medicine | Admitting: Emergency Medicine

## 2010-08-23 DIAGNOSIS — R109 Unspecified abdominal pain: Secondary | ICD-10-CM | POA: Insufficient documentation

## 2010-08-23 DIAGNOSIS — R300 Dysuria: Secondary | ICD-10-CM | POA: Insufficient documentation

## 2010-08-23 LAB — URINALYSIS, ROUTINE W REFLEX MICROSCOPIC
Glucose, UA: NEGATIVE mg/dL
Leukocytes, UA: NEGATIVE
Protein, ur: 30 mg/dL — AB
Urobilinogen, UA: 0.2 mg/dL (ref 0.0–1.0)

## 2010-08-23 LAB — CBC
MCH: 27.4 pg (ref 26.0–34.0)
MCV: 85.1 fL (ref 78.0–100.0)
Platelets: 352 10*3/uL (ref 150–400)
RDW: 14.2 % (ref 11.5–15.5)

## 2010-08-23 LAB — URINE MICROSCOPIC-ADD ON

## 2010-08-23 LAB — DIFFERENTIAL
Eosinophils Absolute: 0.4 10*3/uL (ref 0.0–0.7)
Eosinophils Relative: 6 % — ABNORMAL HIGH (ref 0–5)
Lymphs Abs: 3 10*3/uL (ref 0.7–4.0)
Monocytes Relative: 10 % (ref 3–12)

## 2010-08-23 LAB — BASIC METABOLIC PANEL
BUN: 4 mg/dL — ABNORMAL LOW (ref 6–23)
Creatinine, Ser: 0.59 mg/dL (ref 0.4–1.2)
GFR calc non Af Amer: >60 mL/min (ref >60)

## 2010-08-23 LAB — WET PREP, GENITAL: Yeast Wet Prep HPF POC: NONE SEEN

## 2010-08-26 LAB — GC/CHLAMYDIA PROBE AMP, GENITAL
Chlamydia, DNA Probe: POSITIVE — AB
GC Probe Amp, Genital: NEGATIVE

## 2010-09-03 LAB — URINALYSIS, ROUTINE W REFLEX MICROSCOPIC
Bilirubin Urine: NEGATIVE
Ketones, ur: NEGATIVE mg/dL
Nitrite: NEGATIVE
Protein, ur: 30 mg/dL — AB
Urobilinogen, UA: 0.2 mg/dL (ref 0.0–1.0)
pH: 8 (ref 5.0–8.0)

## 2010-09-03 LAB — PREGNANCY, URINE: Preg Test, Ur: NEGATIVE

## 2010-09-03 LAB — URINE MICROSCOPIC-ADD ON

## 2010-09-04 LAB — URINE MICROSCOPIC-ADD ON

## 2010-09-04 LAB — URINALYSIS, ROUTINE W REFLEX MICROSCOPIC
Glucose, UA: NEGATIVE mg/dL
Leukocytes, UA: NEGATIVE
Protein, ur: NEGATIVE mg/dL
Specific Gravity, Urine: 1.025 (ref 1.005–1.030)
Urobilinogen, UA: 0.2 mg/dL (ref 0.0–1.0)

## 2010-09-04 LAB — POCT PREGNANCY, URINE: Preg Test, Ur: NEGATIVE

## 2010-09-04 LAB — WET PREP, GENITAL
Clue Cells Wet Prep HPF POC: NONE SEEN
Trich, Wet Prep: NONE SEEN

## 2010-09-08 LAB — URINALYSIS, ROUTINE W REFLEX MICROSCOPIC
Bilirubin Urine: NEGATIVE
Ketones, ur: NEGATIVE mg/dL
Nitrite: NEGATIVE
Urobilinogen, UA: 0.2 mg/dL (ref 0.0–1.0)

## 2010-09-08 LAB — PREGNANCY, URINE: Preg Test, Ur: NEGATIVE

## 2010-09-09 LAB — COMPREHENSIVE METABOLIC PANEL
ALT: 81 U/L — ABNORMAL HIGH (ref 0–35)
ALT: 99 U/L — ABNORMAL HIGH (ref 0–35)
AST: 68 U/L — ABNORMAL HIGH (ref 0–37)
AST: 167 U/L — ABNORMAL HIGH (ref 0–37)
Alkaline Phosphatase: 47 U/L (ref 39–117)
Alkaline Phosphatase: 69 U/L (ref 39–117)
BUN: 5 mg/dL — ABNORMAL LOW (ref 6–23)
BUN: 9 mg/dL (ref 6–23)
CO2: 26 mEq/L (ref 19–32)
CO2: 26 mEq/L (ref 19–32)
CO2: 31 mEq/L (ref 19–32)
Calcium: 8.6 mg/dL (ref 8.4–10.5)
Chloride: 103 mEq/L (ref 96–112)
Chloride: 103 mEq/L (ref 96–112)
Creatinine, Ser: 0.71 mg/dL (ref 0.4–1.2)
Creatinine, Ser: 0.78 mg/dL (ref 0.4–1.2)
Creatinine, Ser: 0.6 mg/dL (ref 0.4–1.2)
GFR calc Af Amer: >60 mL/min (ref >60)
GFR calc non Af Amer: >60 mL/min (ref >60)
GFR calc non Af Amer: >60 mL/min (ref >60)
GFR calc non Af Amer: >60 mL/min (ref >60)
Glucose, Bld: 90 mg/dL (ref 70–99)
Potassium: 4.3 mEq/L (ref 3.5–5.1)
Total Bilirubin: 0.3 mg/dL (ref 0.3–1.2)
Total Bilirubin: 0.7 mg/dL (ref 0.3–1.2)

## 2010-09-09 LAB — CBC
HCT: 35.9 % — ABNORMAL LOW (ref 36.0–46.0)
HCT: 38.9 % (ref 36.0–46.0)
Hemoglobin: 12.3 g/dL (ref 12.0–15.0)
MCHC: 34.3 g/dL (ref 30.0–36.0)
MCV: 83.7 fL (ref 78.0–100.0)
MCV: 84.9 fL (ref 78.0–100.0)
MCV: 83.7 fL (ref 78.0–100.0)
RBC: 3.98 MIL/uL (ref 3.87–5.11)
RBC: 4.28 MIL/uL (ref 3.87–5.11)
RBC: 4.65 MIL/uL (ref 3.87–5.11)
WBC: 6.3 10*3/uL (ref 4.0–10.5)
WBC: 6.4 10*3/uL (ref 4.0–10.5)

## 2010-09-09 LAB — LIPASE, BLOOD: Lipase: 99 U/L (ref 23–300)

## 2010-09-09 LAB — DIFFERENTIAL
Basophils Absolute: 0.1 10*3/uL (ref 0.0–0.1)
Basophils Relative: 1 % (ref 0–1)
Eosinophils Absolute: 0.1 10*3/uL (ref 0.0–0.7)
Eosinophils Relative: 1 % (ref 0–5)
Lymphocytes Relative: 52 % — ABNORMAL HIGH (ref 12–46)
Lymphocytes Relative: 31 % (ref 12–46)
Lymphs Abs: 2.4 10*3/uL (ref 0.7–4.0)
Neutro Abs: 3.6 10*3/uL (ref 1.7–7.7)
Neutrophils Relative %: 35 % — ABNORMAL LOW (ref 43–77)

## 2010-09-09 LAB — URINALYSIS, ROUTINE W REFLEX MICROSCOPIC
Bilirubin Urine: NEGATIVE
Hgb urine dipstick: NEGATIVE
Ketones, ur: NEGATIVE mg/dL
Protein, ur: NEGATIVE mg/dL
Urobilinogen, UA: 1 mg/dL (ref 0.0–1.0)

## 2010-09-20 ENCOUNTER — Emergency Department (HOSPITAL_COMMUNITY)
Admission: EM | Admit: 2010-09-20 | Discharge: 2010-09-20 | Disposition: A | Discharge status: Home / Self Care | Payer: Medicaid Other | Attending: Emergency Medicine | Admitting: Emergency Medicine

## 2010-09-20 DIAGNOSIS — G43909 Migraine, unspecified, not intractable, without status migrainosus: Secondary | ICD-10-CM | POA: Insufficient documentation

## 2010-09-20 DIAGNOSIS — H53149 Visual discomfort, unspecified: Secondary | ICD-10-CM | POA: Insufficient documentation

## 2010-09-22 LAB — URINALYSIS, ROUTINE W REFLEX MICROSCOPIC
Nitrite: NEGATIVE
Specific Gravity, Urine: 1.021 (ref 1.005–1.030)
Urobilinogen, UA: 0.2 mg/dL (ref 0.0–1.0)

## 2010-09-22 LAB — URINE MICROSCOPIC-ADD ON

## 2010-09-27 LAB — DIFFERENTIAL
Eosinophils Absolute: 0.1 10*3/uL (ref 0.0–0.7)
Eosinophils Relative: 1 % (ref 0–5)
Lymphocytes Relative: 23 % (ref 12–46)
Lymphs Abs: 2 10*3/uL (ref 0.7–4.0)
Monocytes Absolute: 0.5 10*3/uL (ref 0.1–1.0)

## 2010-09-27 LAB — URINALYSIS, ROUTINE W REFLEX MICROSCOPIC
Bilirubin Urine: NEGATIVE
Bilirubin Urine: NEGATIVE
Ketones, ur: NEGATIVE mg/dL
Nitrite: NEGATIVE
Protein, ur: NEGATIVE mg/dL
Specific Gravity, Urine: 1.022 (ref 1.005–1.030)
Urobilinogen, UA: 0.2 mg/dL (ref 0.0–1.0)
pH: 5.5 (ref 5.0–8.0)

## 2010-09-27 LAB — PREGNANCY, URINE: Preg Test, Ur: NEGATIVE

## 2010-09-27 LAB — CBC
MCHC: 34.2 g/dL (ref 30.0–36.0)
Platelets: 379 10*3/uL (ref 150–400)
RBC: 4.3 MIL/uL (ref 3.87–5.11)
WBC: 8.8 10*3/uL (ref 4.0–10.5)

## 2010-09-27 LAB — COMPREHENSIVE METABOLIC PANEL
ALT: 80 U/L — ABNORMAL HIGH (ref 0–35)
AST: 49 U/L — ABNORMAL HIGH (ref 0–37)
Albumin: 3.3 g/dL — ABNORMAL LOW (ref 3.5–5.2)
CO2: 28 mEq/L (ref 19–32)
Calcium: 9 mg/dL (ref 8.4–10.5)
Chloride: 105 mEq/L (ref 96–112)
Creatinine, Ser: 0.7 mg/dL (ref 0.4–1.2)
GFR calc Af Amer: >60 mL/min (ref >60)
GFR calc non Af Amer: >60 mL/min (ref >60)
Sodium: 139 mEq/L (ref 135–145)

## 2010-09-27 LAB — URINE CULTURE: Colony Count: >=100000

## 2010-09-27 LAB — URINE MICROSCOPIC-ADD ON

## 2010-09-27 LAB — LIPASE, BLOOD: Lipase: 46 U/L (ref 23–300)

## 2010-09-29 ENCOUNTER — Other Ambulatory Visit: Payer: Self-pay | Admitting: Family Medicine

## 2010-10-06 LAB — CBC
MCHC: 33.6 g/dL (ref 30.0–36.0)
MCV: 85 fL (ref 78.0–100.0)
RBC: 4.62 MIL/uL (ref 3.87–5.11)
RDW: 14 % (ref 11.5–15.5)

## 2010-10-06 LAB — BASIC METABOLIC PANEL
CO2: 26 mEq/L (ref 19–32)
Calcium: 9 mg/dL (ref 8.4–10.5)
Chloride: 105 mEq/L (ref 96–112)
Creatinine, Ser: 0.73 mg/dL (ref 0.4–1.2)
GFR calc Af Amer: >60 mL/min (ref >60)
Glucose, Bld: 88 mg/dL (ref 70–99)

## 2010-10-06 LAB — DIFFERENTIAL
Basophils Absolute: 0 10*3/uL (ref 0.0–0.1)
Basophils Relative: 0 % (ref 0–1)
Eosinophils Absolute: 0 10*3/uL (ref 0.0–0.7)
Monocytes Absolute: 0.3 10*3/uL (ref 0.1–1.0)
Monocytes Relative: 5 % (ref 3–12)
Neutro Abs: 2.7 10*3/uL (ref 1.7–7.7)
Neutrophils Relative %: 57 % (ref 43–77)

## 2010-10-06 LAB — RAPID URINE DRUG SCREEN, HOSP PERFORMED
Opiates: NOT DETECTED
Tetrahydrocannabinol: NOT DETECTED

## 2010-11-02 ENCOUNTER — Other Ambulatory Visit: Payer: Self-pay | Admitting: Family Medicine

## 2010-11-03 NOTE — H&P (Signed)
NAMEJASMAIN, AHLBERG               ACCOUNT NO.:  [id number]   MEDICAL RECORD NO.:  [id number]          PATIENT TYPE:  INP   LOCATION:  3009                         FACILITY:  [facility]   PHYSICIAN:  Pramod P. Pearlean Brownie, MD    DATE OF BIRTH:  December 18, 1985   DATE OF ADMISSION:  08/18/2007  DATE OF DISCHARGE:                              HISTORY & PHYSICAL   REASON FOR REFERRAL:  Is altered mental status, lethargy, possible  seizures.   HISTORY OF PRESENT ILLNESS:  Ms. Losier is a 25 year old African  American lady who has a known diagnosis of Sturge-Weber syndrome.  She  has a known history of seizure disorder but was seizure free 5 years ago  and hence was tapered off carbamazepine as per her pediatric  neurologist, Dr. Evelene Croon at [facility]  The patient has had no  breakthrough seizures but over the last 3 days she has been found to be  lethargic and hard to arouse.  She was seen in the emergency room and  thought to have some allergy any itching and was started on prednisone  taper pack as well as Benadryl and hydroxyzine which she has been  taking.  The mother states that the patient is barely able to stay  awake, is sleeping most of the day, has not been speaking and not been  eating well.  She has not witnessed any definite generalized tonic-  clonic seizure activity but the mother feels that at times her eyes are  deviated to the left and this is similar to what she used to do when she  was having previous seizures.  The patient has not been complaining of  headache, denies any focal extremity weakness.   PAST MEDICAL HISTORY:  Her past medical history is significant only for  Sturge-Weber syndrome and seizure disorder but she has been seizure free  for 5 years and off seizure medicines.   HOME MEDICATIONS:  None until 3 days ago and she is currently on tapered  pack of prednisone and on hydroxyzine and Benadryl p.r.n.   PAST SURGICAL HISTORY:  None.   FAMILY HISTORY:  Nobody else  with seizures or Sturge-Weber.   REVIEW OF SYSTEMS:  As documented in history of present illness.   PHYSICAL EXAM:  GENERAL:  Reveals a young, obese African American lady  who is presently not in distress.  VITAL SIGNS:  She is afebrile, pulse rate of 72 per minute, respiratory  rate 16 per minute, distal pulses well felt.  HEENT:  Head is nontraumatic.  Exam unremarkable.  NECK:  There is no neck stiffness.  Neck is supple.  CARDIOVASCULAR:  Regular heart sounds.  No murmur or gallop.  LUNGS:  Clear to auscultation.  NEUROLOGICAL:  The patient is drowsy and lethargic.  She does open her  eyes to commands and to noxious stimuli but falls back to sleep right  away.  There is no fixed tonic eye gaze deviation.  She has spontaneous  eye movements in either direction.  She can follow gaze.  Face is  symmetric.  Tongue is midline.  There is  no typical facial rash of  Sturge-Weber seen.  She moves all four extremities well against gravity.  There is no focal weakness although she tends to prefer to move the  right side less than the left.  Reflexes are symmetric.  Plantars are  downgoing.  Gait was not tested.  Coordination was not tested.   DATA REVIEWED:  CAT scan of the head noncontrast study reveals typical  extensive left parietal dural calcification consistent with.  Sturge-  Weber syndrome.  Otherwise no other intracranial brain abnormalities.   LABS:  UA shows 7-10 WBCs and electrolytes and WBC are normal.  Urine  drug screen is negative.   IMPRESSION:  This is a 25 year old girl with known history of Sturge-  Weber syndrome and remote seizure disorder with new onset of lethargy  and altered mental status for the last 3 days.  Possibilities include  unwitnessed seizures with prolonged icteric stupor.  She may also have a  mild urinary tract infection as well as medication effect from Benadryl  she has received recently may also be contributing.   PLAN:  The patient will be  admitted for observation.  We will treat  empirically with Keppra 1 gram now for presumed seizures and follow with  500 twice a day.  Check EEG in the morning.  Start her on Bactrim for  urinary tract infection.  If she does not improve may need to consider  further evaluation with MRI in the future.  I had a long discussion with  the patient and her mother and answered questions.  Will transfer the  patient to [facility]           ______________________________  Sunny Schlein. Pearlean Brownie, MD     PPS/MEDQ  D:  08/18/2007  T:  08/18/2007  Job:  [id number]

## 2010-11-03 NOTE — Procedures (Signed)
EEG NUMBER:  [id number].   INDICATIONS:  This is a 25 year old with a history of remote seizures  with Sturge-Weber learning disability who presented with altered mental  status, language deficits and possible seizure several days ago.  Medications listed include Keppra, Bactrim and Ativan.   This is a routine 17-channel EEG with 1 channel devoted to EKG utilizing  the International 10/20 lead placement system.  The patient was  described as being lethargic and asleep throughout.  The technologists  indicate that they tried to arouse the patient for this study several  times, but she would not awaken.  The background consists of chiefly  fairly low amplitude mixture of beta, delta and theta activity.  No well-  modulated, well-developed sustained alpha activity is seen throughout.  No clear interhemispheric symmetry is identified, although the wave  forms are slightly better developed in the left frontotemporal region.  No seizure activity is seen.  Some sleep architecture with delta sharp  waves and beta spindles seen intermittently.  Photic stimulation was  performed, however, the patient was still asleep and did not raise any  significant change in the background activity.  The EKG monitor reveals  relatively regular rhythm with a rate of about 90 beats per minute.   CONCLUSION:  Probably abnormal EEG demonstrating generalized slowing.  However, the patient was never alerted or fully awakened, so I cannot  determine what the background in the waking state would be.  No definite  focal abnormality or seizure activity is seen during the course of  today's recording, however.  Clinical correlation is recommended.      Catherine A. Orlin Hilding, M.D.  Electronically Signed     UEA:[id number]  D:  08/18/2007 12:13:31  T:  08/19/2007 10:39:43  Job #:  [id number]

## 2010-11-03 NOTE — Discharge Summary (Signed)
Gina Cameron, Gina Cameron               ACCOUNT NO.:  [id number]   MEDICAL RECORD NO.:  [id number]          PATIENT TYPE:  INP   LOCATION:  3009                         FACILITY:  [facility]   PHYSICIAN:  Pramod P. Pearlean Brownie, MD    DATE OF BIRTH:  12-09-1985   DATE OF ADMISSION:  08/18/2007  DATE OF DISCHARGE:  08/21/2007                               DISCHARGE SUMMARY   ADMISSION DIAGNOSES:  Altered mental status, lethargy, possible seizure.   DISCHARGE DIAGNOSES:  Encephalopathy of unclear etiology, possible  medication effect from Benadryl versus postictal stupor from unwitnessed  seizure.   HOSPITAL COURSE:  Gina Cameron is a 25 year old African-American lady  with known diagnosis of Strassburger syndrome who was found to have  progressed altered mental status, lethargy and difficulty to be aroused  for three days prior to admission.  She had actually complained of some  itching and allergy and was started on prednisone as well as Benadryl  and hydroxyzine.  It was unclear as to how much she was taking but  mother stated that she could barely arouse her, she would open her eyes  and go back to sleep.  There was no definite witnessed seizure activity  described, however, at times the mother said that her eyes would deviate  to the left though there was no clear clonic activity of the face or  extremities noted.  The patient was initially brought to the [facility] emergency room where  neurological consultation was obtained. I  saw the patient and requested the patient be transferred to [facility] for further evaluation.  An EEG was obtained.  The  patient was sleepy throughout the study.  It showed mild generalized  slowing but no definite epileptiform features were noted.  Subsequently  the initial CT scan showed calcific changes in the left  parietal/occipital region consistent with known diagnosis of  Strassburger syndrome.  No acute abnormalities.  Subsequently  an MRI  scan of the brain was obtained which also showed no acute brain  abnormality and confirmed findings of Strassburger syndrome.  The  patient showed slow improvement over the next two to three days.  She  initially was started on Keppra for presumptive diagnosis of seizures,  however, after the EEG and since the patient did not show any  improvement, the Keppra was discontinued by Dr. Thad Ranger.  Her initial  UA showed 5 to 10 white blood cells and she was started on Bactrim but  when the urine cultures came back as showing only 50,000 colonies, and  multiple bacteria, the Bactrim was also stopped.  She showed gradual  improvement in her mental status, became more alert and interactive.  She complained of abdominal pain for which she underwent a CT scan of  the abdomen and pelvis, both of which did not reveal any acute  abnormalities.  She also had an ultrasound of the abdomen done on the  day of discharge, the results of which were pending at the time of this  dictation.  Her basic metabolic panel labs were normal.  Urine drug  screen was negative.  Ammonia level was normal.  B12 level and TSH were  also normal.  Serum cortisol level was normal.  The patient was advised  not to take Bactrim and hydroxyzine as she may have a sensitivity to  these.  She was advised to follow up with her dermatologist, Dr. Margo Aye,  whom she had seen previously for eczema in the next one week and with  Dr. Evelene Croon, neurologist, as needed.   CONDITION ON DISCHARGE:  She was discharged home in a stable condition.   PLAN:  Discharge plan was explained to the patient and family and they  were in agreement.           ______________________________  Sunny Schlein. Pearlean Brownie, MD     PPS/MEDQ  D:  08/21/2007  T:  08/21/2007  Job:  [id number]

## 2010-11-12 ENCOUNTER — Emergency Department (HOSPITAL_BASED_OUTPATIENT_CLINIC_OR_DEPARTMENT_OTHER)
Admission: EM | Admit: 2010-11-12 | Discharge: 2010-11-12 | Disposition: A | Discharge status: Home / Self Care | Payer: Medicaid Other | Attending: Emergency Medicine | Admitting: Emergency Medicine

## 2010-11-12 DIAGNOSIS — R51 Headache: Secondary | ICD-10-CM | POA: Insufficient documentation

## 2010-11-12 DIAGNOSIS — F988 Other specified behavioral and emotional disorders with onset usually occurring in childhood and adolescence: Secondary | ICD-10-CM | POA: Insufficient documentation

## 2010-11-12 DIAGNOSIS — K219 Gastro-esophageal reflux disease without esophagitis: Secondary | ICD-10-CM | POA: Insufficient documentation

## 2011-01-25 ENCOUNTER — Other Ambulatory Visit: Payer: Self-pay | Admitting: Family Medicine

## 2011-03-12 LAB — BLOOD GAS, ARTERIAL
Drawn by: 266271
O2 Content: 2
pCO2 arterial: 37.8
pH, Arterial: 7.457 — ABNORMAL HIGH
pO2, Arterial: 102 — ABNORMAL HIGH

## 2011-03-12 LAB — VITAMIN B12: Vitamin B-12: 291 (ref 211–911)

## 2011-03-12 LAB — AMMONIA: Ammonia: 33

## 2011-03-15 LAB — COMPREHENSIVE METABOLIC PANEL
ALT: 23
ALT: 19
Albumin: 2.7 — ABNORMAL LOW
Alkaline Phosphatase: 41
BUN: 10
BUN: 3 — ABNORMAL LOW
CO2: 24
Calcium: 8.5
Calcium: 8.1 — ABNORMAL LOW
Creatinine, Ser: 0.58
GFR calc non Af Amer: >60
Glucose, Bld: 81
Potassium: 3.8
Sodium: 135
Sodium: 135
Total Protein: 6.6
Total Protein: 5.5 — ABNORMAL LOW

## 2011-03-15 LAB — URINALYSIS, ROUTINE W REFLEX MICROSCOPIC
Glucose, UA: NEGATIVE
Glucose, UA: NEGATIVE
Hgb urine dipstick: NEGATIVE
Ketones, ur: 40 — AB
Nitrite: NEGATIVE
Specific Gravity, Urine: 1.034 — ABNORMAL HIGH
pH: 6
pH: 6.5

## 2011-03-15 LAB — CBC
HCT: 36.7
Hemoglobin: 12.1
MCHC: 33.7
MCHC: 33.2
MCV: 83.8
Platelets: 525 — ABNORMAL HIGH
RBC: 4.37
RDW: 14.9
RDW: 14.3

## 2011-03-15 LAB — URINE MICROSCOPIC-ADD ON

## 2011-03-15 LAB — RAPID URINE DRUG SCREEN, HOSP PERFORMED
Amphetamines: NOT DETECTED
Cocaine: NOT DETECTED
Opiates: NOT DETECTED
Tetrahydrocannabinol: NOT DETECTED

## 2011-03-15 LAB — URINE CULTURE: Special Requests: NEGATIVE

## 2011-03-15 LAB — DIFFERENTIAL
Basophils Relative: 0
Lymphs Abs: 2
Monocytes Absolute: 0.1
Monocytes Relative: 2 — ABNORMAL LOW
Neutro Abs: 4.9
Neutrophils Relative %: 69

## 2011-03-15 LAB — POCT PREGNANCY, URINE
Operator id: 29459
Preg Test, Ur: NEGATIVE

## 2011-03-18 ENCOUNTER — Other Ambulatory Visit: Payer: Self-pay | Admitting: Family Medicine

## 2011-03-18 LAB — POCT I-STAT, CHEM 8
BUN: 8
Hemoglobin: 15
Potassium: 4.2
Sodium: 138
TCO2: 26

## 2011-03-18 LAB — D-DIMER, QUANTITATIVE: D-Dimer, Quant: 1.71 — ABNORMAL HIGH

## 2011-03-18 LAB — POCT PREGNANCY, URINE: Preg Test, Ur: NEGATIVE

## 2011-03-23 LAB — URINALYSIS, ROUTINE W REFLEX MICROSCOPIC
Ketones, ur: 40 — AB
Nitrite: NEGATIVE
Protein, ur: 30 — AB

## 2011-03-23 LAB — URINE CULTURE: Colony Count: 4000

## 2011-03-23 LAB — URINE MICROSCOPIC-ADD ON

## 2011-03-23 LAB — PREGNANCY, URINE: Preg Test, Ur: NEGATIVE

## 2011-04-20 ENCOUNTER — Emergency Department (HOSPITAL_BASED_OUTPATIENT_CLINIC_OR_DEPARTMENT_OTHER)
Admission: EM | Admit: 2011-04-20 | Discharge: 2011-04-20 | Disposition: A | Discharge status: Home / Self Care | Payer: Medicaid Other | Attending: Emergency Medicine | Admitting: Emergency Medicine

## 2011-04-20 ENCOUNTER — Encounter: Payer: Self-pay | Admitting: *Deleted

## 2011-04-20 DIAGNOSIS — J329 Chronic sinusitis, unspecified: Secondary | ICD-10-CM | POA: Insufficient documentation

## 2011-04-20 DIAGNOSIS — F341 Dysthymic disorder: Secondary | ICD-10-CM | POA: Insufficient documentation

## 2011-04-20 DIAGNOSIS — J3489 Other specified disorders of nose and nasal sinuses: Secondary | ICD-10-CM

## 2011-04-20 DIAGNOSIS — K219 Gastro-esophageal reflux disease without esophagitis: Secondary | ICD-10-CM | POA: Insufficient documentation

## 2011-04-20 DIAGNOSIS — F17200 Nicotine dependence, unspecified, uncomplicated: Secondary | ICD-10-CM | POA: Insufficient documentation

## 2011-04-20 HISTORY — DX: Depression, unspecified: F32.A

## 2011-04-20 HISTORY — DX: Major depressive disorder, single episode, unspecified: F32.9

## 2011-04-20 HISTORY — DX: Unspecified convulsions: R56.9

## 2011-04-20 HISTORY — DX: Gastro-esophageal reflux disease without esophagitis: K21.9

## 2011-04-20 HISTORY — DX: Anxiety disorder, unspecified: F41.9

## 2011-04-20 HISTORY — DX: Reserved for inherently not codable concepts without codable children: IMO0001

## 2011-04-20 LAB — RAPID STREP SCREEN (MED CTR MEBANE ONLY): Streptococcus, Group A Screen (Direct): NEGATIVE

## 2011-04-20 MED ORDER — SULFAMETHOXAZOLE-TRIMETHOPRIM 800-160 MG PO TABS: 1.0000 | ORAL_TABLET | Freq: Two times a day (BID) | ORAL | Status: DC

## 2011-04-20 NOTE — ED Notes (Signed)
Patient states she has had problems with sinus headache for the last 3-4 days.  Woke up this morning with generalized bodyaches, headache,sore throat and a intermittent  Bloody nose.

## 2011-04-20 NOTE — ED Provider Notes (Signed)
Reviewed and agree  Dayton Bailiff, MD 04/20/11 [phone]

## 2011-04-20 NOTE — ED Provider Notes (Addendum)
History     CSN: [id number] Arrival date & time: 04/20/2011  3:29 PM   First MD Initiated Contact with Patient 04/20/11 1508      Chief Complaint  Patient presents with  . Sinusitis    nose bleed all day    (Consider location/radiation/quality/duration/timing/severity/associated sxs/prior treatment) Patient is a 25 y.o. female presenting with sinusitis. The history is provided by the patient. No language interpreter was used.  Sinusitis  This is a new problem. The current episode started more than 2 days ago. The problem has been gradually worsening. There has been no fever. The pain is at a severity of 0/10. The patient is experiencing no pain. The pain has been constant since onset. Associated symptoms include sinus pressure and cough. Pertinent negatives include no chills and no swollen glands. She has tried nothing for the symptoms. The treatment provided no relief.    Past Medical History  Diagnosis Date  . Seizures   . Reflux   . Depression   . Anxiety     Past Surgical History  Procedure Date  . Cholecystectomy     No family history on file.  History  Substance Use Topics  . Smoking status: Current Some Day Smoker  . Smokeless tobacco: Not on file  . Alcohol Use: No    OB History    Grav Para Term Preterm Abortions TAB SAB Ect Mult Living                  Review of Systems  Constitutional: Negative for chills.  HENT: Positive for sinus pressure.   Respiratory: Positive for cough.   All other systems reviewed and are negative.    Allergies  Lidocaine-epinephrine; Mepivacaine hcl; Diphenhydramine hcl; and Penicillins  Home Medications   Current Outpatient Rx  Name Route Sig Dispense Refill  . HYDROXYZINE HCL 25 MG PO TABS  TAKE ONE TABLET BY MOUTH 4 TIMES DAILY AS NEEDED 60 tablet 1  . PANTOPRAZOLE SODIUM 40 MG PO TBEC  TAKE ONE TABLET BY MOUTH EVERY DAY 30 tablet 2  . ZARAH 3-0.03 MG PO TABS  TAKE ONE TABLET BY MOUTH EVERY DAY 28 tablet 2     BP 116/79  Pulse 94  Temp(Src) 98.2 F (36.8 C) (Oral)  Resp 18  Ht 5\' 3"  (1.6 m)  Wt 209 lb (94.802 kg)  BMI 37.02 kg/m2  SpO2 99%  LMP 03/23/2011  Physical Exam  Nursing note and vitals reviewed. Constitutional: She is oriented to person, place, and time. She appears well-developed and well-nourished.  HENT:  Head: Normocephalic and atraumatic.  Right Ear: External ear normal.  Left Ear: External ear normal.  Nose: Nose normal.  Mouth/Throat: Oropharyngeal exudate present.       Tender maxillary sinuses,    Eyes: Conjunctivae and EOM are normal. Pupils are equal, round, and reactive to light.  Neck: Normal range of motion. Neck supple.  Cardiovascular: Normal rate, normal heart sounds and intact distal pulses.   Pulmonary/Chest: Effort normal.  Abdominal: Soft.  Musculoskeletal: Normal range of motion.  Neurological: She is alert and oriented to person, place, and time.  Skin: Skin is warm.  Psychiatric: She has a normal mood and affect.    ED Course  Procedures (including critical care time)   Labs Reviewed  RAPID STREP SCREEN   No results found.   No diagnosis found.    MDM   Bactrim ds 20 one bid       Langston Masker, [place] 04/20/11 1542  Langston Masker, [place] 04/20/11 [phone]

## 2011-04-20 NOTE — ED Provider Notes (Signed)
Medical screening examination/treatment/procedure(s) were performed by non-physician practitioner and as supervising physician I was immediately available for consultation/collaboration.   Dayton Bailiff, MD 04/20/11 [phone]

## 2011-05-07 ENCOUNTER — Emergency Department (HOSPITAL_COMMUNITY)
Admission: EM | Admit: 2011-05-07 | Discharge: 2011-05-07 | Disposition: A | Discharge status: Home / Self Care | Payer: Medicaid Other | Attending: Emergency Medicine | Admitting: Emergency Medicine

## 2011-05-07 ENCOUNTER — Encounter (HOSPITAL_COMMUNITY): Payer: Self-pay | Admitting: Emergency Medicine

## 2011-05-07 DIAGNOSIS — G43909 Migraine, unspecified, not intractable, without status migrainosus: Secondary | ICD-10-CM | POA: Insufficient documentation

## 2011-05-07 HISTORY — DX: Gastro-esophageal reflux disease without esophagitis: K21.9

## 2011-05-07 HISTORY — DX: Urticaria, unspecified: L50.9

## 2011-05-07 MED ORDER — METOCLOPRAMIDE HCL 5 MG/ML IJ SOLN
10.0000 mg | Freq: Once | INTRAMUSCULAR | Status: AC
  Administered 2011-05-07: 10 mg via INTRAVENOUS
  Filled 2011-05-07: qty 2

## 2011-05-07 MED ORDER — SODIUM CHLORIDE 0.9 % IV BOLUS (SEPSIS)
1000.0000 mL | Freq: Once | INTRAVENOUS | Status: AC
  Administered 2011-05-07: 1000 mL via INTRAVENOUS

## 2011-05-07 MED ORDER — KETOROLAC TROMETHAMINE 30 MG/ML IJ SOLN
30.0000 mg | Freq: Once | INTRAMUSCULAR | Status: AC
  Administered 2011-05-07: 30 mg via INTRAVENOUS
  Filled 2011-05-07: qty 1

## 2011-05-07 MED ORDER — DEXAMETHASONE SODIUM PHOSPHATE 10 MG/ML IJ SOLN
10.0000 mg | Freq: Once | INTRAMUSCULAR | Status: AC
  Administered 2011-05-07: 10 mg via INTRAVENOUS
  Filled 2011-05-07: qty 1

## 2011-05-07 NOTE — ED Notes (Signed)
PT D/C WAITING ON FATHER IN WAITING ROOM

## 2011-05-07 NOTE — ED Notes (Signed)
Pt presenting to ed with c/o migraine headache x 3 days with positive nausea no vomiting. Pt states sensitivity to light and noise.

## 2011-05-07 NOTE — ED Provider Notes (Signed)
History     CSN: [id number] Arrival date & time: 05/07/2011 12:18 PM   First MD Initiated Contact with Patient 05/07/11 1254      Chief Complaint  Patient presents with  . Migraine    (Consider location/radiation/quality/duration/timing/severity/associated sxs/prior treatment) Patient is a 25 y.o. female presenting with migraine. The history is provided by the patient. No language interpreter was used.  Migraine This is a chronic problem. The current episode started in the past 7 days. Episode frequency: Multiple times a month. The problem has been unchanged. Associated symptoms include headaches and nausea. Pertinent negatives include no abdominal pain, anorexia, arthralgias, change in bowel habit, chest pain, chills, congestion, coughing, diaphoresis, fatigue, fever, joint swelling, myalgias, neck pain, numbness, rash, sore throat, swollen glands, urinary symptoms, vertigo, visual change, vomiting or weakness. Associated symptoms comments: Positive phonophobia and photophobia. Exacerbated by: Light, sound, movement. She has tried NSAIDs for the symptoms. The treatment provided no relief.   this is a typical migraine for the patient. She does not currently have a primary doctor to write her chronic migraine medications as her insurance status has changed.  Past Medical History  Diagnosis Date  . Seizures   . Reflux   . Depression   . Anxiety   . GERD (gastroesophageal reflux disease)   . Hives     Past Surgical History  Procedure Date  . Cholecystectomy     No family history on file.  History  Substance Use Topics  . Smoking status: Current Some Day Smoker  . Smokeless tobacco: Not on file  . Alcohol Use: No     Review of Systems  Constitutional: Negative for fever, chills, diaphoresis and fatigue.  HENT: Negative for hearing loss, ear pain, congestion, sore throat, neck pain, neck stiffness and tinnitus.   Eyes: Positive for photophobia. Negative for pain and visual  disturbance.  Respiratory: Negative for cough and shortness of breath.   Cardiovascular: Negative for chest pain and palpitations.  Gastrointestinal: Positive for nausea. Negative for vomiting, abdominal pain, anorexia and change in bowel habit.  Musculoskeletal: Negative for myalgias, back pain, joint swelling, arthralgias and gait problem.  Skin: Negative for rash and wound.  Neurological: Positive for headaches. Negative for dizziness, vertigo, seizures, syncope, speech difficulty, weakness, light-headedness and numbness.  Psychiatric/Behavioral: Negative for behavioral problems and confusion.    Allergies  Lidocaine-epinephrine; Mepivacaine hcl; Diphenhydramine hcl; and Penicillins  Home Medications   Current Outpatient Rx  Name Route Sig Dispense Refill  . HYDROXYZINE HCL 25 MG PO TABS Oral Take 25 mg by mouth every 4 (four) hours as needed. For anxiety.    . IBUPROFEN 200 MG PO TABS Oral Take 400 mg by mouth every 6 (six) hours as needed. For pain.     Marland Kitchen PANTOPRAZOLE SODIUM 40 MG PO TBEC  TAKE ONE TABLET BY MOUTH EVERY DAY 30 tablet 2  . ZARAH 3-0.03 MG PO TABS  TAKE ONE TABLET BY MOUTH EVERY DAY 28 tablet 2    BP 123/81  Pulse 80  Temp(Src) 97.6 F (36.4 C) (Oral)  Resp 18  SpO2 97%  LMP 03/23/2011  Physical Exam  Nursing note and vitals reviewed. Constitutional: She is oriented to person, place, and time. She appears well-developed and well-nourished.       Uncomfortable appearing  HENT:  Head: Normocephalic and atraumatic.  Eyes: Conjunctivae and EOM are normal. Pupils are equal, round, and reactive to light. No scleral icterus.  Neck: Normal range of motion. Neck supple.  Cardiovascular: Normal  rate, regular rhythm and normal heart sounds.   Pulmonary/Chest: Effort normal and breath sounds normal. She exhibits no tenderness.  Abdominal: Soft. She exhibits no distension. There is no tenderness.  Musculoskeletal: Normal range of motion. She exhibits no edema and no  tenderness.  Lymphadenopathy:    She has no cervical adenopathy.  Neurological: She is alert and oriented to person, place, and time. She has normal strength. No cranial nerve deficit or sensory deficit. She displays a negative Romberg sign. She displays no seizure activity. Coordination and gait normal.  Skin: Skin is warm and dry. No rash noted.  Psychiatric: She has a normal mood and affect. Her behavior is normal.    ED Course  Procedures (including critical care time)  Labs Reviewed - No data to display No results found.   1. Migraine       MDM  Patient with typical migraine relieved with medications. Neurologic exam normal. Have advised that she should obtain care with a new primary doctor that is covered by her new insurance for likely long-term prophylactic migraine treatment.        Elwyn Reach [place], [place] 05/07/11 1600

## 2011-05-10 NOTE — ED Provider Notes (Signed)
Evaluation and management procedures were performed by the PA/NP under my supervision/collaboration.   Rima Blizzard, MD 05/10/11 1256 

## 2011-06-18 ENCOUNTER — Other Ambulatory Visit: Payer: Self-pay | Admitting: Family Medicine

## 2011-06-22 ENCOUNTER — Other Ambulatory Visit: Payer: Self-pay | Admitting: Family Medicine

## 2011-07-02 ENCOUNTER — Encounter: Payer: Self-pay | Admitting: Family Medicine

## 2011-07-02 ENCOUNTER — Ambulatory Visit (INDEPENDENT_AMBULATORY_CARE_PROVIDER_SITE_OTHER): Payer: Managed Care, Other (non HMO) | Admitting: Family Medicine

## 2011-07-02 VITALS — BP 122/84 | HR 82 | Temp 98.6°F | Wt 225.0 lb

## 2011-07-02 DIAGNOSIS — G4733 Obstructive sleep apnea (adult) (pediatric): Secondary | ICD-10-CM

## 2011-07-02 DIAGNOSIS — Q859 Phakomatosis, unspecified: Secondary | ICD-10-CM

## 2011-07-02 DIAGNOSIS — Q858 Other phakomatoses, not elsewhere classified: Secondary | ICD-10-CM

## 2011-07-02 DIAGNOSIS — G43909 Migraine, unspecified, not intractable, without status migrainosus: Secondary | ICD-10-CM

## 2011-07-02 DIAGNOSIS — F329 Major depressive disorder, single episode, unspecified: Secondary | ICD-10-CM

## 2011-07-02 LAB — CBC WITH DIFFERENTIAL/PLATELET
Basophils Absolute: 0 10*3/uL (ref 0.0–0.1)
Eosinophils Absolute: 0.3 10*3/uL (ref 0.0–0.7)
Hemoglobin: 13.7 g/dL (ref 12.0–15.0)
Lymphocytes Relative: 42.4 % (ref 12.0–46.0)
MCHC: 33 g/dL (ref 30.0–36.0)
Neutro Abs: 2.8 10*3/uL (ref 1.4–7.7)
Neutrophils Relative %: 43.9 % (ref 43.0–77.0)
RDW: 15.1 % — ABNORMAL HIGH (ref 11.5–14.6)

## 2011-07-02 LAB — BASIC METABOLIC PANEL
CO2: 25 mEq/L (ref 19–32)
Calcium: 8.9 mg/dL (ref 8.4–10.5)
Creatinine, Ser: 0.6 mg/dL (ref 0.4–1.2)
Glucose, Bld: 81 mg/dL (ref 70–99)

## 2011-07-02 LAB — HEPATIC FUNCTION PANEL
Albumin: 3.3 g/dL — ABNORMAL LOW (ref 3.5–5.2)
Alkaline Phosphatase: 50 U/L (ref 39–117)

## 2011-07-02 LAB — SEDIMENTATION RATE: Sed Rate: 39 mm/hr — ABNORMAL HIGH (ref 0–22)

## 2011-07-02 LAB — TSH: TSH: 1 u[IU]/mL (ref 0.35–5.50)

## 2011-07-02 MED ORDER — HYDROCODONE-ACETAMINOPHEN 5-500 MG PO TABS: 1.0000 | ORAL_TABLET | Freq: Three times a day (TID) | ORAL | Status: AC | PRN

## 2011-07-02 MED ORDER — KETOROLAC TROMETHAMINE 60 MG/2ML IM SOLN
60.0000 mg | Freq: Once | INTRAMUSCULAR | Status: AC
  Administered 2011-07-02: 60 mg via INTRAMUSCULAR

## 2011-07-02 MED ORDER — CITALOPRAM HYDROBROMIDE 20 MG PO TABS: 20.0000 mg | ORAL_TABLET | Freq: Every day | ORAL | Status: DC

## 2011-07-02 NOTE — Progress Notes (Signed)
  Subjective:    Patient ID: Gina Cameron, female    DOB: 1986-06-11, 26 y.o.   MRN: [id number]  HPI Pt here with her father c/o worsening migraines.  Pt with hx sturge weber syn and migraines and had an MRI 3 years ago --stable.  Pt has not been to neuro in several years.  No blurry vision, no aura.  No cp or sob.   Review of Systems As above    Objective:   Physical Exam  Constitutional: She is oriented to person, place, and time. She appears well-developed and well-nourished.  HENT:  Head: Normocephalic and atraumatic.  Right Ear: External ear normal.  Left Ear: External ear normal.  Mouth/Throat: Oropharynx is clear and moist.  Eyes: Conjunctivae and EOM are normal. Pupils are equal, round, and reactive to light. Right eye exhibits no discharge. Left eye exhibits no discharge.  Neck: Normal range of motion. Neck supple.  Cardiovascular: Normal rate, regular rhythm and normal heart sounds.   No murmur heard. Pulmonary/Chest: Effort normal and breath sounds normal. No respiratory distress. She has no wheezes. She has no rales. She exhibits no tenderness.  Musculoskeletal: She exhibits no edema.  Neurological: She is alert and oriented to person, place, and time.  Skin: Skin is warm and dry.  Psychiatric: She has a normal mood and affect. Her behavior is normal. Judgment and thought content normal.          Assessment & Plan:  Hx of stuge webber syn with worsening migraines as well---refer to neuro                                                                                                  vicodin for pain                                                                                                   toradol given in office                                                                                                  Check labs

## 2011-07-02 NOTE — Patient Instructions (Addendum)
Depression, Adolescent and Adult Depression is a true and treatable medical condition. In general there are two kinds of depression:  Depression we all experience in some form. For example depression from the death of a loved one, financial distress or natural disasters will trigger or increase depression.   Clinical depression, on the other hand, appears without an apparent cause or reason. This depression is a disease. Depression may be caused by chemical imbalance in the body and brain or may come as a response to a physical illness. Alcohol and other drugs can cause depression.  DIAGNOSIS  The diagnosis of depression is usually based upon symptoms and medical history. TREATMENT  Treatments for depression fall into three categories. These are:  Drug therapy. There are many medicines that treat depression. Responses may vary and sometimes trial and error is necessary to determine the best medicines and dosage for a particular patient.   Psychotherapy, also called talking treatments, helps people resolve their problems by looking at them from a different point of view and by giving people insight into their own personal makeup. Traditional psychotherapy looks at a childhood source of a problem. Other psychotherapy will look at current conflicts and move toward solving those. If the cause of depression is drug use, counseling is available to help abstain. In time the depression will usually improve. If there were underlying causes for the chemical use, they can be addressed.   ECT (electroconvulsive therapy) or shock treatment is not as commonly used today. It is a very effective treatment for severe suicidal depression. During ECT electrical impulses are applied to the head. These impulses cause a generalized seizure. It can be effective but causes a loss of memory for recent events. Sometimes this loss of memory may include the last several months.  Treat all depression or suicide threats as  serious. Obtain professional help. Do not wait to see if serious depression will get better over time without help. Seek help for yourself or those around you. In the [place] the number to the National Suicide Help Lines With 24 Hour Help Are: 1-800-SUICIDE 303-795-2921 Document Released: 06/04/2000 Document Revised: 02/17/2011 Document Reviewed: 01/24/2008 [facility] Patient Information 2012 [place], [place].  Smoking Cessation This document explains the best ways for you to quit smoking and new treatments to help. It lists new medicines that can double or triple your chances of quitting and quitting for good. It also considers ways to avoid relapses and concerns you may have about quitting, including weight gain. NICOTINE: A POWERFUL ADDICTION If you have tried to quit smoking, you know how hard it can be. It is hard because nicotine is a very addictive drug. For some people, it can be as addictive as heroin or cocaine. Usually, people make 2 or 3 tries, or more, before finally being able to quit. Each time you try to quit, you can learn about what helps and what hurts. Quitting takes hard work and a lot of effort, but you can quit smoking. QUITTING SMOKING IS ONE OF THE MOST IMPORTANT THINGS YOU WILL EVER DO.  You will live longer, feel better, and live better.   The impact on your body of quitting smoking is felt almost immediately:   Within 20 minutes, blood pressure decreases. Pulse returns to its normal level.   After 8 hours, carbon monoxide levels in the blood return to normal. Oxygen level increases.   After 24 hours, chance of heart attack starts to decrease. Breath, hair, and body stop smelling like smoke.  After 48 hours, damaged nerve endings begin to recover. Sense of taste and smell improve.   After 72 hours, the body is virtually free of nicotine. Bronchial tubes relax and breathing becomes easier.   After 2 to 12 weeks, lungs can hold more air. Exercise becomes easier and  circulation improves.   Quitting will reduce your risk of having a heart attack, stroke, cancer, or lung disease:   After 1 year, the risk of coronary heart disease is cut in half.   After 5 years, the risk of stroke falls to the same as a nonsmoker.   After 10 years, the risk of lung cancer is cut in half and the risk of other cancers decreases significantly.   After 15 years, the risk of coronary heart disease drops, usually to the level of a nonsmoker.   If you are pregnant, quitting smoking will improve your chances of having a healthy baby.   The people you live with, especially your children, will be healthier.   You will have extra money to spend on things other than cigarettes.  FIVE KEYS TO QUITTING Studies have shown that these 5 steps will help you quit smoking and quit for good. You have the best chances of quitting if you use them together: 1. Get ready.  2. Get support and encouragement.  3. Learn new skills and behaviors.  4. Get medicine to reduce your nicotine addiction and use it correctly.  5. Be prepared for relapse or difficult situations. Be determined to continue trying to quit, even if you do not succeed at first.  1. GET READY  Set a quit date.   Change your environment.   Get rid of ALL cigarettes, ashtrays, matches, and lighters in your home, car, and place of work.   Do not let people smoke in your home.   Review your past attempts to quit. Think about what worked and what did not.   Once you quit, do not smoke. NOT EVEN A PUFF!  2. GET SUPPORT AND ENCOURAGEMENT Studies have shown that you have a better chance of being successful if you have help. You can get support in many ways.  Tell your family, friends, and coworkers that you are going to quit and need their support. Ask them not to smoke around you.   Talk to your caregivers (doctor, dentist, nurse, pharmacist, psychologist, and/or smoking counselor).   Get individual, group, or telephone  counseling and support. The more counseling you have, the better your chances are of quitting. Programs are available at [employer] and health centers. Call your local health department for information about programs in your area.   Spiritual beliefs and practices may help some smokers quit.   Quit meters are Photographer that keep track of quit statistics, such as amount of "quit-time," cigarettes not smoked, and money saved.   Many smokers find one or more of the many self-help books available useful in helping them quit and stay off tobacco.  3. LEARN NEW SKILLS AND BEHAVIORS  Try to distract yourself from urges to smoke. Talk to someone, go for a walk, or occupy your time with a task.   When you first try to quit, change your routine. Take a different route to work. Drink tea instead of coffee. Eat breakfast in a different place.   Do something to reduce your stress. Take a hot bath, exercise, or read a book.   Plan something enjoyable to do every day.  Reward yourself for not smoking.   Explore interactive web-based programs that specialize in helping you quit.  4. GET MEDICINE AND USE IT CORRECTLY Medicines can help you stop smoking and decrease the urge to smoke. Combining medicine with the above behavioral methods and support can quadruple your chances of successfully quitting smoking. The [place] Food and Drug Administration (FDA) has approved 7 medicines to help you quit smoking. These medicines fall into 3 categories.  Nicotine replacement therapy (delivers nicotine to your body without the negative effects and risks of smoking):   Nicotine gum: Available over-the-counter.   Nicotine lozenges: Available over-the-counter.   Nicotine inhaler: Available by prescription.   Nicotine nasal spray: Available by prescription.   Nicotine skin patches (transdermal): Available by prescription and over-the-counter.   Antidepressant medicine (helps  people abstain from smoking, but how this works is unknown):   Bupropion sustained-release (SR) tablets: Available by prescription.   Nicotinic receptor partial agonist (simulates the effect of nicotine in your brain):   Varenicline tartrate tablets: Available by prescription.   Ask your caregiver for advice about which medicines to use and how to use them. Carefully read the information on the package.   Everyone who is trying to quit may benefit from using a medicine. If you are pregnant or trying to become pregnant, nursing an infant, you are under age 32, or you smoke fewer than 10 cigarettes per day, talk to your caregiver before taking any nicotine replacement medicines.   You should stop using a nicotine replacement product and call your caregiver if you experience nausea, dizziness, weakness, vomiting, fast or irregular heartbeat, mouth problems with the lozenge or gum, or redness or swelling of the skin around the patch that does not go away.   Do not use any other product containing nicotine while using a nicotine replacement product.   Talk to your caregiver before using these products if you have diabetes, heart disease, asthma, stomach ulcers, you had a recent heart attack, you have high blood pressure that is not controlled with medicine, a history of irregular heartbeat, or you have been prescribed medicine to help you quit smoking.  5. BE PREPARED FOR RELAPSE OR DIFFICULT SITUATIONS  Most relapses occur within the first 3 months after quitting. Do not be discouraged if you start smoking again. Remember, most people try several times before they finally quit.   You may have symptoms of withdrawal because your body is used to nicotine. You may crave cigarettes, be irritable, feel very hungry, cough often, get headaches, or have difficulty concentrating.   The withdrawal symptoms are only temporary. They are strongest when you first quit, but they will go away within 10 to 14  days.  Here are some difficult situations to watch for:  Alcohol. Avoid drinking alcohol. Drinking lowers your chances of successfully quitting.   Caffeine. Try to reduce the amount of caffeine you consume. It also lowers your chances of successfully quitting.   Other smokers. Being around smoking can make you want to smoke. Avoid smokers.   Weight gain. Many smokers will gain weight when they quit, usually less than 10 pounds. Eat a healthy diet and stay active. Do not let weight gain distract you from your main goal, quitting smoking. Some medicines that help you quit smoking may also help delay weight gain. You can always lose the weight gained after you quit.   Bad mood or depression. There are a lot of ways to improve your mood other than smoking.  If you are having problems with any of these situations, talk to your caregiver. SPECIAL SITUATIONS AND CONDITIONS Studies suggest that everyone can quit smoking. Your situation or condition can give you a special reason to quit.  Pregnant women/new mothers: By quitting, you protect your baby's health and your own.   Hospitalized patients: By quitting, you reduce health problems and help healing.   Heart attack patients: By quitting, you reduce your risk of a second heart attack.   Lung, head, and neck cancer patients: By quitting, you reduce your chance of a second cancer.   Parents of children and adolescents: By quitting, you protect your children from illnesses caused by secondhand smoke.  QUESTIONS TO THINK ABOUT Think about the following questions before you try to stop smoking. You may want to talk about your answers with your caregiver.  Why do you want to quit?   If you tried to quit in the past, what helped and what did not?   What will be the most difficult situations for you after you quit? How will you plan to handle them?   Who can help you through the tough times? Your family? Friends? Caregiver?   What pleasures do  you get from smoking? What ways can you still get pleasure if you quit?  Here are some questions to ask your caregiver:  How can you help me to be successful at quitting?   What medicine do you think would be best for me and how should I take it?   What should I do if I need more help?   What is smoking withdrawal like? How can I get information on withdrawal?  Quitting takes hard work and a lot of effort, but you can quit smoking. FOR MORE INFORMATION  Smokefree.gov (http://www.davis-sullivan.com/) provides free, accurate, evidence-based information and professional assistance to help support the immediate and long-term needs of people trying to quit smoking. Document Released: 06/01/2001 Document Revised: 02/17/2011 Document Reviewed: 03/24/2009 [facility] Patient Information 2012 [place], [place].

## 2011-07-14 ENCOUNTER — Other Ambulatory Visit (INDEPENDENT_AMBULATORY_CARE_PROVIDER_SITE_OTHER): Payer: Managed Care, Other (non HMO)

## 2011-07-14 DIAGNOSIS — R700 Elevated erythrocyte sedimentation rate: Secondary | ICD-10-CM

## 2011-07-14 LAB — SEDIMENTATION RATE: Sed Rate: 32 mm/hr — ABNORMAL HIGH (ref 0–22)

## 2011-07-16 ENCOUNTER — Other Ambulatory Visit: Payer: Managed Care, Other (non HMO)

## 2011-07-19 ENCOUNTER — Ambulatory Visit (INDEPENDENT_AMBULATORY_CARE_PROVIDER_SITE_OTHER): Payer: Managed Care, Other (non HMO) | Admitting: Pulmonary Disease

## 2011-07-19 ENCOUNTER — Encounter: Payer: Self-pay | Admitting: Pulmonary Disease

## 2011-07-19 VITALS — BP 116/88 | HR 98 | Temp 98.4°F | Ht 62.0 in | Wt 227.2 lb

## 2011-07-19 DIAGNOSIS — G4733 Obstructive sleep apnea (adult) (pediatric): Secondary | ICD-10-CM

## 2011-07-19 NOTE — Patient Instructions (Signed)
Will have your machine set on auto with pressure limits of 5-12cm Will have your dme show you nasal pillows and a nasal mask to see if will fit better.  Will probably have to add chin strap to prevent mouth opening. Work on weight loss followup with me in 5 weeks, but please call if you are having issues with tolerance.

## 2011-07-19 NOTE — Assessment & Plan Note (Signed)
The patient has a history of very severe obstructive sleep apnea, but has not been using her CPAP and has been lost to followup.  She is currently having issues with her mask leaking, and therefore has to pull her mask strap tighter which in turn causes face discomfort.  Will work on a better mask fit with her, and also optimize her pressure again at some point in time.  I have also encouraged her to work aggressively on weight loss.

## 2011-07-19 NOTE — Progress Notes (Signed)
  Subjective:    Patient ID: Gina Cameron, female    DOB: 05-27-1986, 26 y.o.   MRN: [id number]  HPI Patient comes in today for followup of her known severe obstructive sleep apnea.  She has been started on CPAP, but has been lost to followup since 2011.  She quit using CPAP because of issues with mask fitting, primarily due to leaks.  She really did not have issues with pressure.  She currently has a full face mask, but is having to pull the straps tight in order to prevent leaking area   Review of Systems  Constitutional: Negative for fever and unexpected weight change.  HENT: Positive for congestion and rhinorrhea. Negative for ear pain, nosebleeds, sore throat, sneezing, trouble swallowing, dental problem, postnasal drip and sinus pressure.   Eyes: Positive for redness. Negative for itching.  Respiratory: Positive for cough and chest tightness. Negative for shortness of breath and wheezing.   Cardiovascular: Negative for palpitations and leg swelling.  Gastrointestinal: Positive for nausea. Negative for vomiting.  Genitourinary: Negative for dysuria.  Musculoskeletal: Negative for joint swelling.  Skin: Positive for rash.  Neurological: Positive for headaches.  Hematological: Bruises/bleeds easily.  Psychiatric/Behavioral: Positive for dysphoric mood. The patient is nervous/anxious.        Objective:   Physical Exam Obese female in no acute distress Nose without purulence or discharge noted Chest clear to auscultation Cardiac exam with regular rate and rhythm Lower extremities without edema, no cyanosis Alert and oriented, does not appear overly sleepy, moves all 4 extremities.       Assessment & Plan:

## 2011-08-02 ENCOUNTER — Other Ambulatory Visit: Payer: Self-pay | Admitting: Family Medicine

## 2011-08-23 ENCOUNTER — Ambulatory Visit: Payer: Managed Care, Other (non HMO) | Admitting: Pulmonary Disease

## 2011-08-24 DIAGNOSIS — Z8679 Personal history of other diseases of the circulatory system: Secondary | ICD-10-CM | POA: Diagnosis not present

## 2011-08-24 DIAGNOSIS — G43909 Migraine, unspecified, not intractable, without status migrainosus: Secondary | ICD-10-CM | POA: Diagnosis not present

## 2011-08-25 ENCOUNTER — Encounter: Payer: Self-pay | Admitting: Family Medicine

## 2011-08-25 ENCOUNTER — Ambulatory Visit (INDEPENDENT_AMBULATORY_CARE_PROVIDER_SITE_OTHER): Payer: Managed Care, Other (non HMO) | Admitting: Family Medicine

## 2011-08-25 VITALS — BP 100/70 | HR 105 | Temp 99.4°F | Wt 221.0 lb

## 2011-08-25 DIAGNOSIS — J329 Chronic sinusitis, unspecified: Secondary | ICD-10-CM

## 2011-08-25 MED ORDER — PREDNISONE 10 MG PO TABS: ORAL_TABLET | ORAL | Status: DC

## 2011-08-25 MED ORDER — MOMETASONE FUROATE 50 MCG/ACT NA SUSP: 2.0000 | Freq: Every day | NASAL | Status: DC

## 2011-08-25 MED ORDER — ERYTHROMYCIN BASE 500 MG PO TABS: 500.0000 mg | ORAL_TABLET | Freq: Two times a day (BID) | ORAL | Status: AC

## 2011-08-25 NOTE — Patient Instructions (Signed)

## 2011-08-25 NOTE — Progress Notes (Signed)
  Subjective:     Gina Cameron is a 26 y.o. female who presents for evaluation of sinus pain. Symptoms include: congestion, fevers, headaches, nasal congestion, purulent rhinorrhea, sinus pressure and sore throat. Onset of symptoms was 3 days ago. Symptoms have been gradually worsening since that time. Past history is significant for chronic bronchitis. Patient is a non-smoker.  + sore throat  The following portions of the patient's history were reviewed and updated as appropriate: allergies, current medications, past family history, past medical history, past social history, past surgical history and problem list.  Review of Systems Pertinent items are noted in HPI.   Objective:    BP 100/70  Pulse 105  Temp(Src) 99.4 F (37.4 C) (Oral)  Wt 221 lb (100.245 kg)  SpO2 98% General appearance: alert, cooperative, appears stated age and mild distress Ears: normal TM's and external ear canals both ears Nose: green discharge, moderate congestion, turbinates red, swollen, sinus tenderness bilateral Throat: abnormal findings: marked oropharyngeal erythema and pnd Neck: mild anterior cervical adenopathy and thyroid not enlarged, symmetric, no tenderness/mass/nodules Lungs: clear to auscultation bilaterally Heart: S1, S2 normal Skin: Skin color, texture, turgor normal. No rashes or lesions    Assessment:    Acute bacterial sinusitis.    Plan:    Nasal steroids per medication orders. Antihistamines per medication orders. Ceftin per medication orders. f/u prn

## 2011-08-31 ENCOUNTER — Ambulatory Visit: Payer: Managed Care, Other (non HMO) | Admitting: Pulmonary Disease

## 2011-09-20 ENCOUNTER — Other Ambulatory Visit: Payer: Self-pay | Admitting: Family Medicine

## 2011-09-28 ENCOUNTER — Encounter: Payer: Managed Care, Other (non HMO) | Admitting: Family Medicine

## 2011-11-01 ENCOUNTER — Emergency Department (HOSPITAL_COMMUNITY)
Admission: EM | Admit: 2011-11-01 | Discharge: 2011-11-02 | Disposition: A | Discharge status: Home / Self Care | Payer: Managed Care, Other (non HMO) | Attending: Emergency Medicine | Admitting: Emergency Medicine

## 2011-11-01 ENCOUNTER — Encounter (HOSPITAL_COMMUNITY): Payer: Self-pay | Admitting: Emergency Medicine

## 2011-11-01 DIAGNOSIS — R209 Unspecified disturbances of skin sensation: Secondary | ICD-10-CM | POA: Insufficient documentation

## 2011-11-01 DIAGNOSIS — R51 Headache: Secondary | ICD-10-CM

## 2011-11-01 DIAGNOSIS — K219 Gastro-esophageal reflux disease without esophagitis: Secondary | ICD-10-CM | POA: Insufficient documentation

## 2011-11-01 DIAGNOSIS — F341 Dysthymic disorder: Secondary | ICD-10-CM | POA: Insufficient documentation

## 2011-11-01 DIAGNOSIS — Z79899 Other long term (current) drug therapy: Secondary | ICD-10-CM | POA: Insufficient documentation

## 2011-11-01 LAB — URINALYSIS, ROUTINE W REFLEX MICROSCOPIC
Ketones, ur: NEGATIVE mg/dL
Leukocytes, UA: NEGATIVE
Nitrite: NEGATIVE
Protein, ur: NEGATIVE mg/dL
Urobilinogen, UA: 0.2 mg/dL (ref 0.0–1.0)
pH: 6 (ref 5.0–8.0)

## 2011-11-01 NOTE — ED Notes (Signed)
Pt alert, nad, c/o numbness, right sided, moved to bilateral this evening, per pt hx of seizures, denies recent seizure, states "i feel like i do when i have seizures", resp even unlabored, skin pwd, ambulates to room steady gait noted, denies h/a, denies n/v

## 2011-11-02 MED ORDER — METOCLOPRAMIDE HCL 5 MG/ML IJ SOLN
10.0000 mg | Freq: Once | INTRAMUSCULAR | Status: AC
  Administered 2011-11-02: 10 mg via INTRAVENOUS
  Filled 2011-11-02: qty 2

## 2011-11-02 MED ORDER — KETOROLAC TROMETHAMINE 30 MG/ML IJ SOLN
30.0000 mg | Freq: Once | INTRAMUSCULAR | Status: AC
  Administered 2011-11-02: 30 mg via INTRAVENOUS
  Filled 2011-11-02: qty 1

## 2011-11-02 MED ORDER — SODIUM CHLORIDE 0.9 % IV SOLN
Freq: Once | INTRAVENOUS | Status: AC
  Administered 2011-11-02: 02:00:00 via INTRAVENOUS

## 2011-11-02 MED ORDER — NAPROXEN 500 MG PO TABS: 500.0000 mg | ORAL_TABLET | Freq: Two times a day (BID) | ORAL | Status: DC

## 2011-11-02 MED ORDER — OXYCODONE-ACETAMINOPHEN 5-325 MG PO TABS: 1.0000 | ORAL_TABLET | ORAL | Status: AC | PRN

## 2011-11-02 MED ORDER — OXYCODONE-ACETAMINOPHEN 5-325 MG PO TABS
2.0000 | ORAL_TABLET | Freq: Once | ORAL | Status: AC
  Administered 2011-11-02: 2 via ORAL
  Filled 2011-11-02: qty 2

## 2011-11-02 NOTE — ED Provider Notes (Signed)
History     CSN: [id number]  Arrival date & time 11/01/11  2144   First MD Initiated Contact with Patient 11/02/11 0126      Chief Complaint  Patient presents with  . Numbness    (Consider location/radiation/quality/duration/timing/severity/associated sxs/prior treatment) HPI Comments: 26 year old female presents with a complaint of headache and numbness. She states that in the last 12 hours she developed a left-sided frontal headache that has been persistent, throbbing and associated with right lower extremity numbness and left hand numbness. This has been persistent, nothing makes it better or worse, has not taken any medication prior to arrival stating that she does not have any pain medication at home. She denies weakness, difficulty with ambulation, visual changes, nausea vomiting. She does admit to photophobia. She states that she gets almost daily headaches though they usually improved with Excedrin Migraine. She has not had that medication today. She denies fever, stiff neck and has not had numbness like this with her headaches in the past.  The history is provided by the patient and a relative.    Past Medical History  Diagnosis Date  . Seizures   . Reflux   . Depression   . Anxiety   . GERD (gastroesophageal reflux disease)   . Hives     Past Surgical History  Procedure Date  . Cholecystectomy     No family history on file.  History  Substance Use Topics  . Smoking status: Former Smoker -- 0.1 packs/day for 1.5 years    Types: Cigarettes    Quit date: 05/22/2011  . Smokeless tobacco: Not on file  . Alcohol Use: No    OB History    Grav Para Term Preterm Abortions TAB SAB Ect Mult Living                  Review of Systems  All other systems reviewed and are negative.    Allergies  Lidocaine-epinephrine; Mepivacaine hcl; Diphenhydramine hcl; and Penicillins  Home Medications   Current Outpatient Rx  Name Route Sig Dispense Refill  . CETIRIZINE  HCL 10 MG PO TABS  TAKE ONE TABLET BY MOUTH AT BEDTIME AS NEEDED 30 tablet 1  . CITALOPRAM HYDROBROMIDE 20 MG PO TABS Oral Take 1 tablet (20 mg total) by mouth daily. 30 tablet 3  . HYDROXYZINE HCL 25 MG PO TABS  TAKE ONE TABLET BY MOUTH 4 TIMES DAILY AS NEEDED 60 tablet 0  . PANTOPRAZOLE SODIUM 40 MG PO TBEC  TAKE ONE TABLET BY MOUTH EVERY DAY 30 tablet 2  . PREDNISONE 10 MG PO TABS Oral Take 10 mg by mouth daily.    Marland Kitchen SYEDA 3-0.03 MG PO TABS  TAKE ONE TABLET BY MOUTH EVERY DAY 28 tablet 2  . TOPIRAMATE 50 MG PO TABS Oral Take 100 mg by mouth at bedtime.    Marland Kitchen NAPROXEN 500 MG PO TABS Oral Take 1 tablet (500 mg total) by mouth 2 (two) times daily with a meal. 30 tablet 0  . OXYCODONE-ACETAMINOPHEN 5-325 MG PO TABS Oral Take 1 tablet by mouth every 4 (four) hours as needed for pain. May take 2 tablets PO q 6 hours for severe pain - Do not take with Tylenol as this tablet already contains tylenol 15 tablet 0    BP 118/69  Pulse 84  Temp(Src) 98.4 F (36.9 C) (Oral)  Resp 16  SpO2 95%  Physical Exam  Nursing note and vitals reviewed. Constitutional: She appears well-developed and well-nourished. No distress.  HENT:  Head: Normocephalic and atraumatic.  Mouth/Throat: Oropharynx is clear and moist. No oropharyngeal exudate.  Eyes: Conjunctivae and EOM are normal. Pupils are equal, round, and reactive to light. Right eye exhibits no discharge. Left eye exhibits no discharge. No scleral icterus.  Neck: Normal range of motion. Neck supple. No JVD present. No thyromegaly present.  Cardiovascular: Normal rate, regular rhythm, normal heart sounds and intact distal pulses.  Exam reveals no gallop and no friction rub.   No murmur heard. Pulmonary/Chest: Effort normal and breath sounds normal. No respiratory distress. She has no wheezes. She has no rales.  Abdominal: Soft. Bowel sounds are normal. She exhibits no distension and no mass. There is no tenderness.  Musculoskeletal: Normal range of motion.  She exhibits no edema and no tenderness.  Lymphadenopathy:    She has no cervical adenopathy.  Neurological: She is alert. Coordination normal.       Normal speech, normal extraocular movements, normal pupillary exam, normal coordination with all 4 extremities, normal gait, normal strength with bilateral grips, able to lift both legs off the bed for 10 seconds with normal strength. Has decreased sensation to light touch of the right lower extremity in a stocking glove distribution as well as her left upper extremity in stocking glove distribution as well.  Skin: Skin is warm and dry. No rash noted. No erythema.  Psychiatric: She has a normal mood and affect. Her behavior is normal.    ED Course  Procedures (including critical care time)   Labs Reviewed  URINALYSIS, ROUTINE W REFLEX MICROSCOPIC  PREGNANCY, URINE  GLUCOSE, CAPILLARY   No results found.   1. Headache       MDM  HA, possible complicated migraine - has normal VS - fluids, meds, reeval.  No other focal neuro sx and  with bilateral asymmetrical numbness doubt central neurologic process.    Patient reevaluated, has no numbness in the arms or legs, has residual headache which is improving. Percocet ordered prior to discharge, patient appears stable with no focal neurologic deficits and I suspect this is migrainous activity.  Patient explained indications for return, has agreed with same.  Discharge Prescriptions include:  #1 Naprosyn  #2 Percocet  Vida Roller, MD 11/02/11 [phone]

## 2011-11-02 NOTE — Discharge Instructions (Signed)
Headache:  You are having a headache. No specific cause was found today for your headache. It may have been a migraine or other cause of headache. Stress, anxiety, fatigue, and depression are common triggers for headaches. Your headache today does not appear to be life-threatening or require hospitalization, but often the exact cause of headaches is not determined in the emergency department. Therefore, followup with your doctor is very important to find out what may have caused your headache, and whether or not you need any further diagnostic testing or treatment. Sometimes headaches can appear benign but then more serious symptoms can develop which should prompt an immediate reevaluation by your doctor or the emergency department.  Seek immediate medical attention if:  You develop possible problems with medications prescribed. The medications don't resolve your headache, if it recurs, or if you have multiple episodes of vomiting or can't take fluids by mouth You have a change from the usual headache. If you developed a sudden severe headache or confusion, become poorly responsive or faint, developed a fever above 100.4 or problems breathing, have a change in speech, vision, swallowing or understanding, or developed new weakness, numbness, tingling, incoordination or have a seizure.  If you don't have a family doctor to follow up with, see the follow up list below - call this morning for a follow-up appointment in the next 1-2 days.  RESOURCE GUIDE  Dental Problems  Patients with Medicaid: [facility]                     [facility] [address]                                           [address] Phone:  [phone]                                                  Phone:  [phone]  If unable to pay or uninsured, contact:  [facility] or [facility] to become qualified for the adult dental clinic.  Chronic Pain Problems Contact [facility]  [phone] Patients need to be referred by their primary care doctor.  Insufficient Money for Medicine Contact [employer]:  call "211" or [facility] 271-5999.  No Primary Care Doctor Call [facility]  832-8000 Other agencies that provide inexpensive medical care    [facility]  [phone]    [facility]  [phone]    [facility]  [phone]    Women[facility] Clinic  [phone]    [facility]  [phone]    [facility]  [phone]  [facility]  Health  [phone] [facility]  [phone] [facility]   800 853-5163 (emergency services 641-4993)  Substance Abuse Resources [facility]  336-882-2125 [facility] 336-784-9470 [facility] 336-285-9073 [facility] 336-845-3988 Residential & Outpatient Substance Abuse Program  800-659-3381  Abuse/Neglect [place] Child Abuse Hotline (336) 641-3795 [place] [facility] Abuse Hotline 800-378-5315 (After Hours)  Emergency Shelter [facility] (336) 271-5985  [facility] (336) 275-9566 [facility] (704) 372-4663  MRSA Hotline #:     832-7006    [place] [facility]  [facility] [place]     [employer]                          [facility] [address] [place]                       [address]      [address]  [place]                                                [place]                            [facility] Phone:  [phone]                                   Phone:  [phone]                 Phone:  [phone]  [facility] Phone:  [phone]  [place] Child Abuse Hotline (336) 342-1394 (336) 342-3537 (After Hours)   

## 2011-11-16 ENCOUNTER — Other Ambulatory Visit: Payer: Self-pay

## 2011-11-16 ENCOUNTER — Telehealth: Payer: Self-pay | Admitting: Family Medicine

## 2011-11-16 DIAGNOSIS — F329 Major depressive disorder, single episode, unspecified: Secondary | ICD-10-CM

## 2011-11-16 MED ORDER — CITALOPRAM HYDROBROMIDE 20 MG PO TABS: 20.0000 mg | ORAL_TABLET | Freq: Every day | ORAL | Status: DC

## 2011-11-16 MED ORDER — HYDROXYZINE HCL 25 MG PO TABS: 25.0000 mg | ORAL_TABLET | Freq: Four times a day (QID) | ORAL | Status: DC | PRN

## 2011-11-16 MED ORDER — DROSPIRENONE-ETHINYL ESTRADIOL 3-0.03 MG PO TABS: 1.0000 | ORAL_TABLET | Freq: Every day | ORAL | Status: DC

## 2011-11-16 MED ORDER — TOPIRAMATE 50 MG PO TABS: 100.0000 mg | ORAL_TABLET | Freq: Every day | ORAL | Status: DC

## 2011-11-16 NOTE — Telephone Encounter (Signed)
Pt states she has changed pharmacies and needs all her prescriptions sent to [facility] on [address]. I asked her to verify what medications she needs refills for and she states she does not know. She states she just needs all of them. Call back # [phone]

## 2011-11-23 ENCOUNTER — Ambulatory Visit (HOSPITAL_BASED_OUTPATIENT_CLINIC_OR_DEPARTMENT_OTHER)
Admission: RE | Admit: 2011-11-23 | Discharge: 2011-11-23 | Disposition: A | Discharge status: Home / Self Care | Payer: Managed Care, Other (non HMO) | Source: Ambulatory Visit | Attending: Family Medicine | Admitting: Family Medicine

## 2011-11-23 ENCOUNTER — Encounter: Payer: Self-pay | Admitting: Family Medicine

## 2011-11-23 ENCOUNTER — Ambulatory Visit (INDEPENDENT_AMBULATORY_CARE_PROVIDER_SITE_OTHER): Payer: Managed Care, Other (non HMO) | Admitting: Family Medicine

## 2011-11-23 VITALS — BP 120/76 | HR 87 | Temp 98.9°F | Wt 228.0 lb

## 2011-11-23 DIAGNOSIS — M25569 Pain in unspecified knee: Secondary | ICD-10-CM

## 2011-11-23 DIAGNOSIS — M25561 Pain in right knee: Secondary | ICD-10-CM

## 2011-11-23 DIAGNOSIS — M898X9 Other specified disorders of bone, unspecified site: Secondary | ICD-10-CM | POA: Insufficient documentation

## 2011-11-23 MED ORDER — PANTOPRAZOLE SODIUM 40 MG PO TBEC: 40.0000 mg | DELAYED_RELEASE_TABLET | Freq: Every day | ORAL | Status: DC

## 2011-11-23 MED ORDER — TRAMADOL HCL 50 MG PO TABS: 50.0000 mg | ORAL_TABLET | Freq: Three times a day (TID) | ORAL | Status: AC | PRN

## 2011-11-23 NOTE — Patient Instructions (Signed)
Knee Pain The knee is the complex joint between your thigh and your lower leg. It is made up of bones, tendons, ligaments, and cartilage. The bones that make up the knee are:  The femur in the thigh.   The tibia and fibula in the lower leg.   The patella or kneecap riding in the groove on the lower femur.  CAUSES  Knee pain is a common complaint with many causes. A few of these causes are:  Injury, such as:   A ruptured ligament or tendon injury.   Torn cartilage.   Medical conditions, such as:   Gout   Arthritis   Infections   Overuse, over training or overdoing a physical activity.  Knee pain can be minor or severe. Knee pain can accompany debilitating injury. Minor knee problems often respond well to self-care measures or get well on their own. More serious injuries may need medical intervention or even surgery. SYMPTOMS The knee is complex. Symptoms of knee problems can vary widely. Some of the problems are:  Pain with movement and weight bearing.   Swelling and tenderness.   Buckling of the knee.   Inability to straighten or extend your knee.   Your knee locks and you cannot straighten it.   Warmth and redness with pain and fever.   Deformity or dislocation of the kneecap.  DIAGNOSIS  Determining what is wrong may be very straight forward such as when there is an injury. It can also be challenging because of the complexity of the knee. Tests to make a diagnosis may include:  Your caregiver taking a history and doing a physical exam.   Routine X-rays can be used to rule out other problems. X-rays will not reveal a cartilage tear. Some injuries of the knee can be diagnosed by:   Arthroscopy a surgical technique by which a small video camera is inserted through tiny incisions on the sides of the knee. This procedure is used to examine and repair internal knee joint problems. Tiny instruments can be used during arthroscopy to repair the torn knee cartilage  (meniscus).   Arthrography is a radiology technique. A contrast liquid is directly injected into the knee joint. Internal structures of the knee joint then become visible on X-ray film.   An MRI scan is a non x-ray radiology procedure in which magnetic fields and a computer produce two- or three-dimensional images of the inside of the knee. Cartilage tears are often visible using an MRI scanner. MRI scans have largely replaced arthrography in diagnosing cartilage tears of the knee.   Blood work.   Examination of the fluid that helps to lubricate the knee joint (synovial fluid). This is done by taking a sample out using a needle and a syringe.  TREATMENT The treatment of knee problems depends on the cause. Some of these treatments are:  Depending on the injury, proper casting, splinting, surgery or physical therapy care will be needed.   Give yourself adequate recovery time. Do not overuse your joints. If you begin to get sore during workout routines, back off. Slow down or do fewer repetitions.   For repetitive activities such as cycling or running, maintain your strength and nutrition.   Alternate muscle groups. For example if you are a weight lifter, work the upper body on one day and the lower body the next.   Either tight or weak muscles do not give the proper support for your knee. Tight or weak muscles do not absorb the stress placed   on the knee joint. Keep the muscles surrounding the knee strong.   Take care of mechanical problems.   If you have flat feet, orthotics or special shoes may help. See your caregiver if you need help.   Arch supports, sometimes with wedges on the inner or outer aspect of the heel, can help. These can shift pressure away from the side of the knee most bothered by osteoarthritis.   A brace called an "unloader" brace also may be used to help ease the pressure on the most arthritic side of the knee.   If your caregiver has prescribed crutches, braces,  wraps or ice, use as directed. The acronym for this is PRICE. This means protection, rest, ice, compression and elevation.   Nonsteroidal anti-inflammatory drugs (NSAID's), can help relieve pain. But if taken immediately after an injury, they may actually increase swelling. Take NSAID's with food in your stomach. Stop them if you develop stomach problems. Do not take these if you have a history of ulcers, stomach pain or bleeding from the bowel. Do not take without your caregiver's approval if you have problems with fluid retention, heart failure, or kidney problems.   For ongoing knee problems, physical therapy may be helpful.   Glucosamine and chondroitin are over-the-counter dietary supplements. Both may help relieve the pain of osteoarthritis in the knee. These medicines are different from the usual anti-inflammatory drugs. Glucosamine may decrease the rate of cartilage destruction.   Injections of a corticosteroid drug into your knee joint may help reduce the symptoms of an arthritis flare-up. They may provide pain relief that lasts a few months. You may have to wait a few months between injections. The injections do have a small increased risk of infection, water retention and elevated blood sugar levels.   Hyaluronic acid injected into damaged joints may ease pain and provide lubrication. These injections may work by reducing inflammation. A series of shots may give relief for as long as 6 months.   Topical painkillers. Applying certain ointments to your skin may help relieve the pain and stiffness of osteoarthritis. Ask your pharmacist for suggestions. Many over the-counter products are approved for temporary relief of arthritis pain.   In some countries, doctors often prescribe topical NSAID's for relief of chronic conditions such as arthritis and tendinitis. A review of treatment with NSAID creams found that they worked as well as oral medications but without the serious side effects.    PREVENTION  Maintain a healthy weight. Extra pounds put more strain on your joints.   Get strong, stay limber. Weak muscles are a common cause of knee injuries. Stretching is important. Include flexibility exercises in your workouts.   Be smart about exercise. If you have osteoarthritis, chronic knee pain or recurring injuries, you may need to change the way you exercise. This does not mean you have to stop being active. If your knees ache after jogging or playing basketball, consider switching to swimming, water aerobics or other low-impact activities, at least for a few days a week. Sometimes limiting high-impact activities will provide relief.   Make sure your shoes fit well. Choose footwear that is right for your sport.   Protect your knees. Use the proper gear for knee-sensitive activities. Use kneepads when playing volleyball or laying carpet. Buckle your seat belt every time you drive. Most shattered kneecaps occur in car accidents.   Rest when you are tired.  SEEK MEDICAL CARE IF:  You have knee pain that is continual and does not   seem to be getting better.  SEEK IMMEDIATE MEDICAL CARE IF:  Your knee joint feels hot to the touch and you have a high fever. MAKE SURE YOU:   Understand these instructions.   Will watch your condition.   Will get help right away if you are not doing well or get worse.  Document Released: 04/04/2007 Document Revised: 05/27/2011 Document Reviewed: 04/04/2007 [employer] Patient Information 2012 [employer]. 

## 2011-11-23 NOTE — Progress Notes (Signed)
  Subjective:    Gina Cameron is a 26 y.o. female ( father is present) who presents with a knee injury involving the right knee. Onset was gradual, starting about a few weeks ago. Inciting event: twisting injury while playing around with friends.. Current symptoms include: giving out, locking, stiffness and swelling. Pain is aggravated by any weight bearing and kneeling. Patient has had no prior knee problems. Evaluation to date: none. Treatment to date: none.  The following portions of the patient's history were reviewed and updated as appropriate: allergies, current medications, past family history, past medical history, past social history, past surgical history and problem list.   Review of Systems Pertinent items are noted in HPI.   Objective:    BP 120/76  Pulse 87  Temp(Src) 98.9 F (37.2 C) (Oral)  Wt 228 lb (103.42 kg)  SpO2 98%  LMP 11/21/2011 Right knee: positive exam findings: effusion and tenderness noted lateral and negative exam findings: no erythema, no patellar laxity, McMurray's negative, no crepitus and decreased rom secondary to swelling  Left knee:  normal and no effusion, full active range of motion, no joint line tenderness, ligamentous structures intact.   X-ray right knee: soft tissue swelling and no effusion    Assessment:    Right Mild knee sprain on the right    Plan:    Natural history and expected course discussed. Questions answered. Rest, ice, compression, and elevation (RICE) therapy. Patellar compression sleeve. OTC analgesics as needed. Plain film x-rays.

## 2012-01-15 ENCOUNTER — Other Ambulatory Visit: Payer: Self-pay | Admitting: Family Medicine

## 2012-01-26 ENCOUNTER — Ambulatory Visit (INDEPENDENT_AMBULATORY_CARE_PROVIDER_SITE_OTHER): Payer: Medicare Other | Admitting: Internal Medicine

## 2012-01-26 ENCOUNTER — Encounter: Payer: Self-pay | Admitting: Internal Medicine

## 2012-01-26 ENCOUNTER — Other Ambulatory Visit: Payer: Self-pay

## 2012-01-26 VITALS — BP 110/72 | HR 91 | Temp 98.6°F | Wt 223.6 lb

## 2012-01-26 DIAGNOSIS — R51 Headache: Secondary | ICD-10-CM

## 2012-01-26 DIAGNOSIS — Q859 Phakomatosis, unspecified: Secondary | ICD-10-CM

## 2012-01-26 MED ORDER — RIZATRIPTAN BENZOATE 10 MG PO TBDP: 10.0000 mg | ORAL_TABLET | ORAL | Status: DC | PRN

## 2012-01-26 NOTE — Progress Notes (Signed)
  Subjective:    Patient ID: Gina Cameron, female    DOB: 14-Apr-1986, 26 y.o.   MRN: [id number]  HPI She has had almost constant headaches for 3 days described as sharp and initially present in the left temple. It also has spread to involve the frontal area as well as the right temple. Additionally she's had pain in the left lateral maxillary area as well.  This is been associated with nausea as well as sensitivity to sound and light. She describes a possible prodrome of left dental pain  She's been taking Excedrin Migraine, possibly 2 every 3-4 hours. She is unable to quantitate exactly how many  she has taken. She also has generic Topamax 50 mg which is taken once nightly. It is labeled as TWO each night.  She describes this headache as typical of her migraines.  She drinks 2-3 sodas a day and has 1-2 chocolate bars a day. She denies intake of significant amounts of tea. She'll have coffee every other day.  The evaluation performed at [facility] 08/24/11 was reviewed. At that time Dr. Leanna Battles had recommended headache hygiene measures. He prescribed Topamax 50 mg titrated to a dose of 2 at bedtime. He also recommended Maxalt MLT abortively at the onset of severe headache. He recommended obtaining an updated MRI; the family is unaware of this recommendation. Followup in the clinic was to be in May or June. He mentioned other potential therapeutic options in that note.      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  She has sleep apnea but is not employing her CPAP.       Objective:   Physical Exam  Gen. appearance: Well-nourished, in no distress Eyes: Extraocular motion intact, field of vision normal, vision grossly intact, no nystagmus ENT: Canals clear, tympanic membranes normal, tuning fork exam normal, hearing grossly normal Neck: Normal range of motion, no masses, normal thyroid Cardiovascular: Rate and rhythm normal; no  murmurs, gallops or extra heart sounds Muscle skeletal: Range of motion, tone, &  strength normal Neuro:no cranial nerve deficit, deep tendon  reflexes normal, gait normal. Romberg testing and finger to nose testing normal Lymph: No cervical or axillary LA Skin: Warm and dry without suspicious lesions or rashes Psych: no anxiety or mood change. Normally interactive and cooperative.         Assessment & Plan:  #1 daily headaches; the most likely etiology here is possible migraine converted into chronic daily headaches by the excessive use of Excedrin Migraine and possibly the excess ingestion of caffeine. She is not on adequate doses of the prophylactic Topamax. No use of recommended tryptan  #2 sleep apnea; nonedematous to CPAP. Uncontrolled sleep apnea may be contributing to #1 but probably is not a major factor.  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 She should stop the excessive ingestion of Excedrin Migraine and caffeine. The Topamax should be increased to 100 mg at bedtime. Followup with Dr. Leanna Battles will be scheduled

## 2012-01-26 NOTE — Telephone Encounter (Signed)
CPE scheduled for 03/22/12--- KP

## 2012-01-26 NOTE — Patient Instructions (Addendum)
Please keep a diary of your headaches . Document  each occurrence on the calendar with notation of : #1 any prodrome ( any non headache symptom such as marked fatigue,visual changes, ,etc ) which precedes actual headache ; #2) severity on 1-10 scale; #3) any triggers ( food/ drink,enviromenntal or weather changes ,physical or emotional stress) in 8-12 hour period prior to the headache; & #4) response to any medications or other intervention. Please review "Headache" @ [facility] for additional information.    Eat a low-fat diet with lots of fruits and vegetables, up to 7-9 servings per day. Consume less than 30 (preferably ZERO)  grams of sugar per day from foods & drinks with High Fructose Corn Syrup (HFCS) sugar as #1,2,3 or # 4 on label.Whole Foods, [employer] & [employer] do not carry products with HFCS. Follow a  low carb nutrition program such as [place] or [employer] Sugar [employer]  to prevent Diabetes progression . White carbohydrates (potatoes, rice, bread, and pasta) have a high spike of sugar and a high load of sugar. For example a  baked potato has a cup of sugar and a  french fry  2 teaspoons of sugar. Yams, wild  rice, whole grained bread &  wheat pasta have been much lower spike and load of  sugar. Portions should be the size of a deck of cards or your palm.  Please try to go on My Chart within the next 24 hours to allow me to release the results directly to you.

## 2012-02-11 ENCOUNTER — Other Ambulatory Visit: Payer: Self-pay | Admitting: Family Medicine

## 2012-02-13 ENCOUNTER — Other Ambulatory Visit: Payer: Self-pay | Admitting: Family Medicine

## 2012-02-18 ENCOUNTER — Other Ambulatory Visit: Payer: Self-pay | Admitting: Family Medicine

## 2012-03-16 ENCOUNTER — Other Ambulatory Visit: Payer: Self-pay | Admitting: Family Medicine

## 2012-03-16 NOTE — Telephone Encounter (Signed)
Rx needs okay.       MW

## 2012-03-22 ENCOUNTER — Encounter: Payer: Medicare Other | Admitting: Family Medicine

## 2012-03-23 ENCOUNTER — Other Ambulatory Visit: Payer: Self-pay | Admitting: Family Medicine

## 2012-03-23 ENCOUNTER — Encounter: Payer: Self-pay | Admitting: Family Medicine

## 2012-03-23 ENCOUNTER — Ambulatory Visit (INDEPENDENT_AMBULATORY_CARE_PROVIDER_SITE_OTHER): Payer: Medicare Other | Admitting: Family Medicine

## 2012-03-23 VITALS — BP 118/77 | HR 82 | Temp 97.9°F | Ht 61.0 in | Wt 218.6 lb

## 2012-03-23 DIAGNOSIS — J019 Acute sinusitis, unspecified: Secondary | ICD-10-CM

## 2012-03-23 MED ORDER — FLUTICASONE PROPIONATE 50 MCG/ACT NA SUSP: 2.0000 | Freq: Every day | NASAL | Status: DC

## 2012-03-23 MED ORDER — SULFAMETHOXAZOLE-TRIMETHOPRIM 800-160 MG PO TABS: 1.0000 | ORAL_TABLET | Freq: Two times a day (BID) | ORAL | Status: AC

## 2012-03-23 MED ORDER — GUAIFENESIN-CODEINE 100-10 MG/5ML PO SYRP: 10.0000 mL | ORAL_SOLUTION | Freq: Three times a day (TID) | ORAL | Status: DC | PRN

## 2012-03-23 NOTE — Assessment & Plan Note (Signed)
Pt's sxs and PE consistent w/ infxn.  Start abx.  Cough meds prn.  Reviewed supportive care and red flags that should prompt return.  Pt expressed understanding and is in agreement w/ plan.  

## 2012-03-23 NOTE — Progress Notes (Signed)
  Subjective:    Patient ID: Gina Cameron, female    DOB: April 09, 1986, 26 y.o.   MRN: [id number]  HPI Sinus congestion- sxs started 4-5 days ago.  + nasal congestion, productive cough, red watery eyes, sneezing, difficulty sleeping.  Bilateral ear fullness.  + sore throat.  No fevers.  + maxillary pain, HAs.  + sick contacts.  Took alka seltzer cold and sinus, advil cold and sinus w/out relief.   Review of Systems For ROS see HPI     Objective:   Physical Exam  Vitals reviewed. Constitutional: She appears well-developed and well-nourished. No distress.  HENT:  Head: Normocephalic and atraumatic.  Right Ear: Tympanic membrane normal.  Left Ear: Tympanic membrane normal.  Nose: Mucosal edema and rhinorrhea present. Right sinus exhibits maxillary sinus tenderness and frontal sinus tenderness. Left sinus exhibits maxillary sinus tenderness and frontal sinus tenderness.  Mouth/Throat: Uvula is midline and mucous membranes are normal. Posterior oropharyngeal erythema present. No oropharyngeal exudate.  Eyes: Conjunctivae normal and EOM are normal. Pupils are equal, round, and reactive to light.  Neck: Normal range of motion. Neck supple.  Cardiovascular: Normal rate, regular rhythm and normal heart sounds.   Pulmonary/Chest: Effort normal and breath sounds normal. No respiratory distress. She has no wheezes.  Lymphadenopathy:    She has no cervical adenopathy.          Assessment & Plan:

## 2012-03-23 NOTE — Patient Instructions (Addendum)
This is a sinus infection Start the Septra DS twice daily to treat infection Continue Claritin or Zyrtec daily for allergies Add the flonase- 2 sprays each nostril- for the allergies Drink plenty of fluids REST! Mucinex to thin the congestion Cough syrup as needed Hang in there!!!

## 2012-04-17 ENCOUNTER — Other Ambulatory Visit: Payer: Self-pay | Admitting: Family Medicine

## 2012-04-28 ENCOUNTER — Encounter: Payer: Self-pay | Admitting: Family Medicine

## 2012-04-28 ENCOUNTER — Other Ambulatory Visit: Payer: Self-pay | Admitting: Family Medicine

## 2012-04-28 ENCOUNTER — Ambulatory Visit (INDEPENDENT_AMBULATORY_CARE_PROVIDER_SITE_OTHER): Payer: Medicare Other | Admitting: Family Medicine

## 2012-04-28 VITALS — BP 120/76 | HR 77 | Temp 98.5°F | Resp 16 | Wt 218.5 lb

## 2012-04-28 DIAGNOSIS — F329 Major depressive disorder, single episode, unspecified: Secondary | ICD-10-CM

## 2012-04-28 DIAGNOSIS — L2089 Other atopic dermatitis: Secondary | ICD-10-CM

## 2012-04-28 MED ORDER — CITALOPRAM HYDROBROMIDE 40 MG PO TABS: 40.0000 mg | ORAL_TABLET | Freq: Every day | ORAL | Status: DC

## 2012-04-28 MED ORDER — BUPROPION HCL ER (XL) 150 MG PO TB24: 150.0000 mg | ORAL_TABLET | Freq: Every day | ORAL | Status: DC

## 2012-04-28 MED ORDER — TRIAMCINOLONE ACETONIDE 0.1 % EX OINT: TOPICAL_OINTMENT | Freq: Two times a day (BID) | CUTANEOUS | Status: DC

## 2012-04-28 NOTE — Assessment & Plan Note (Signed)
Deteriorated.  Start topical steroid ointment bid.  Reviewed supportive care and red flags that should prompt return.  Pt expressed understanding and is in agreement w/ plan.

## 2012-04-28 NOTE — Telephone Encounter (Signed)
Rx sent.    MW 

## 2012-04-28 NOTE — Assessment & Plan Note (Signed)
Deteriorated.  Will increase celexa to 40mg .  Add Wellbutrin to augment effects.  Suggested counseling.  Pt not interested.  Will need close f/u and possibly psych referral as pt reports she has been on multiple meds previously.

## 2012-04-28 NOTE — Progress Notes (Signed)
  Subjective:    Patient ID: Gina Cameron, female    DOB: 1986-04-16, 26 y.o.   MRN: [id number]  HPI Depression- chronic problem, on Celexa 20mg  for ~11 months.  Pt reports sxs have worsened in the last 10 days.  'i don't feel anything, i feel numb'.  Feels like med 'stopped working'.  Occasionally feels tearful but more frequently feels like she should cry but can't.  Increased sleep.  'i'm just so depressed'.  No SI/HI.  Has tried counseling in the past, 'i don't think it works for me'.  Denies changes at home.   Review of Systems For ROS see HPI     Objective:   Physical Exam  Vitals reviewed. Constitutional: She appears well-developed and well-nourished.  Skin: Skin is warm and dry.       Eczematous patches in flexor creases of elbows and knees  Psychiatric: Judgment and thought content normal.       Flat, withdrawn, obviously depressed          Assessment & Plan:

## 2012-04-28 NOTE — Patient Instructions (Addendum)
Follow up in 1 month to recheck mood Increase the Celexa to 40mg  daily Start the Wellbutrin daily Apply the triamcinolone twice daily to the eczema patches Call with any questions or concerns Hang in there!

## 2012-05-23 ENCOUNTER — Other Ambulatory Visit: Payer: Self-pay | Admitting: Family Medicine

## 2012-05-23 MED ORDER — GUAIFENESIN-CODEINE 100-10 MG/5ML PO SYRP: 10.0000 mL | ORAL_SOLUTION | Freq: Three times a day (TID) | ORAL | Status: DC | PRN

## 2012-05-23 NOTE — Telephone Encounter (Signed)
Last seen 04/28/12 and filled 03/23/12. Please advise     KP

## 2012-05-23 NOTE — Telephone Encounter (Signed)
OK X 1; OV if cough persists or associated with SOB or fever

## 2012-05-23 NOTE — Telephone Encounter (Signed)
refill Cheratussin AC Syrup #240, Take by mouth three times daily as needed for cough last fill 10.3.13 last ov 10.3.13 acute, CPE sch for 1.20.14

## 2012-05-23 NOTE — Telephone Encounter (Signed)
Detailed message left on VM with Hop's recommendations.    KP

## 2012-05-28 ENCOUNTER — Other Ambulatory Visit: Payer: Self-pay | Admitting: Family Medicine

## 2012-06-25 ENCOUNTER — Other Ambulatory Visit: Payer: Self-pay | Admitting: Family Medicine

## 2012-07-10 ENCOUNTER — Encounter: Payer: Managed Care, Other (non HMO) | Admitting: Family Medicine

## 2012-07-10 ENCOUNTER — Emergency Department (HOSPITAL_COMMUNITY)
Admission: EM | Admit: 2012-07-10 | Discharge: 2012-07-10 | Disposition: A | Discharge status: Home / Self Care | Payer: Medicare Other | Attending: Emergency Medicine | Admitting: Emergency Medicine

## 2012-07-10 ENCOUNTER — Encounter (HOSPITAL_COMMUNITY): Payer: Self-pay | Admitting: *Deleted

## 2012-07-10 DIAGNOSIS — G43919 Migraine, unspecified, intractable, without status migrainosus: Secondary | ICD-10-CM | POA: Insufficient documentation

## 2012-07-10 DIAGNOSIS — H53149 Visual discomfort, unspecified: Secondary | ICD-10-CM | POA: Insufficient documentation

## 2012-07-10 DIAGNOSIS — R110 Nausea: Secondary | ICD-10-CM | POA: Diagnosis not present

## 2012-07-10 DIAGNOSIS — Q859 Phakomatosis, unspecified: Secondary | ICD-10-CM | POA: Diagnosis not present

## 2012-07-10 DIAGNOSIS — F411 Generalized anxiety disorder: Secondary | ICD-10-CM | POA: Insufficient documentation

## 2012-07-10 DIAGNOSIS — Z8679 Personal history of other diseases of the circulatory system: Secondary | ICD-10-CM | POA: Insufficient documentation

## 2012-07-10 DIAGNOSIS — IMO0002 Reserved for concepts with insufficient information to code with codable children: Secondary | ICD-10-CM | POA: Insufficient documentation

## 2012-07-10 DIAGNOSIS — Z87891 Personal history of nicotine dependence: Secondary | ICD-10-CM | POA: Insufficient documentation

## 2012-07-10 DIAGNOSIS — K219 Gastro-esophageal reflux disease without esophagitis: Secondary | ICD-10-CM | POA: Insufficient documentation

## 2012-07-10 DIAGNOSIS — F329 Major depressive disorder, single episode, unspecified: Secondary | ICD-10-CM | POA: Insufficient documentation

## 2012-07-10 DIAGNOSIS — E669 Obesity, unspecified: Secondary | ICD-10-CM | POA: Diagnosis not present

## 2012-07-10 DIAGNOSIS — Z79899 Other long term (current) drug therapy: Secondary | ICD-10-CM | POA: Diagnosis not present

## 2012-07-10 DIAGNOSIS — Z7982 Long term (current) use of aspirin: Secondary | ICD-10-CM | POA: Diagnosis not present

## 2012-07-10 DIAGNOSIS — G40909 Epilepsy, unspecified, not intractable, without status epilepticus: Secondary | ICD-10-CM | POA: Diagnosis not present

## 2012-07-10 DIAGNOSIS — F3289 Other specified depressive episodes: Secondary | ICD-10-CM | POA: Insufficient documentation

## 2012-07-10 MED ORDER — SODIUM CHLORIDE 0.9 % IV SOLN
INTRAVENOUS | Status: DC
  Administered 2012-07-10: 08:00:00 via INTRAVENOUS

## 2012-07-10 MED ORDER — HYDROMORPHONE HCL PF 1 MG/ML IJ SOLN
1.0000 mg | INTRAMUSCULAR | Status: AC | PRN
  Administered 2012-07-10 (×3): 1 mg via INTRAVENOUS
  Filled 2012-07-10 (×3): qty 1

## 2012-07-10 MED ORDER — METOCLOPRAMIDE HCL 5 MG/ML IJ SOLN
10.0000 mg | Freq: Once | INTRAMUSCULAR | Status: AC
  Administered 2012-07-10: 10 mg via INTRAVENOUS
  Filled 2012-07-10: qty 2

## 2012-07-10 NOTE — ED Provider Notes (Signed)
History     CSN: [id number]  Arrival date & time 07/10/12  4098   First MD Initiated Contact with Patient 07/10/12 0725      Chief Complaint  Patient presents with  . Headache     Patient is a 27 y.o. female presenting with headaches. The history is provided by the patient.  Headache  This is a recurrent problem. The current episode started more than 2 days ago. The problem occurs constantly. The problem has not changed since onset.The pain is located in the temporal and frontal region. The pain is severe. The pain does not radiate. Associated symptoms include nausea. Pertinent negatives include no fever and no vomiting. Associated symptoms comments: Photophobia .  Pt has a history of recurrent headaches.  She has been diagnosed with migraines and has been evaluated by a neurologist at [facility].  She has had MRI.She was supposed to be on maxalt but her insurance will not pay for it.  She has tried otc excedrin migraine without relief.    Past Medical History  Diagnosis Date  . Seizures   . Reflux   . Depression   . Anxiety   . GERD (gastroesophageal reflux disease)   . Hives   Sturge Weber syndrome Recurrent headaches  Past Surgical History  Procedure Date  . Cholecystectomy     Family History  Problem Relation Age of Onset  . Hypertension Father   . Hyperlipidemia Father     History  Substance Use Topics  . Smoking status: Former Smoker -- 0.1 packs/day for 1.5 years    Types: Cigarettes    Quit date: 05/22/2011  . Smokeless tobacco: Not on file  . Alcohol Use: No    OB History    Grav Para Term Preterm Abortions TAB SAB Ect Mult Living                  Review of Systems  Constitutional: Negative for fever.  Gastrointestinal: Positive for nausea. Negative for vomiting.  Neurological: Positive for headaches. Negative for seizures, speech difficulty and numbness.  All other systems reviewed and are negative.    Allergies  Lidocaine-epinephrine;  Mepivacaine hcl; Diphenhydramine hcl; and Penicillins  Home Medications   Current Outpatient Rx  Name  Route  Sig  Dispense  Refill  . ASPIRIN-ACETAMINOPHEN-CAFFEINE 250-250-65 MG PO TABS   Oral   Take 1 tablet by mouth as needed.         . BUPROPION HCL ER (XL) 150 MG PO TB24   Oral   Take 1 tablet (150 mg total) by mouth daily.   30 tablet   3   . CITALOPRAM HYDROBROMIDE 40 MG PO TABS   Oral   Take 1 tablet (40 mg total) by mouth daily.   30 tablet   3   . FLUTICASONE PROPIONATE 50 MCG/ACT NA SUSP   Nasal   Place 2 sprays into the nose daily.   16 g   6   . GUAIFENESIN-CODEINE 100-10 MG/5ML PO SYRP   Oral   Take 10 mLs by mouth 3 (three) times daily as needed for cough.   240 mL   0   . HYDROXYZINE HCL 25 MG PO TABS      TAKE 1 TABLET BY MOUTH FOUR TIMES DAILY AS NEEDED FOR ITCHING   60 tablet   0   . MOMETASONE FUROATE 50 MCG/ACT NA SUSP   Nasal   Place 2 sprays into the nose daily as needed.         Marland Kitchen  PANTOPRAZOLE SODIUM 40 MG PO TBEC      TAKE 1 TABLET BY MOUTH DAILY   30 tablet   0   . RIZATRIPTAN BENZOATE 10 MG PO TBDP   Oral   Take 1 tablet (10 mg total) by mouth as needed for migraine. May repeat in 2 hours if needed   10 tablet   0   . TOPIRAMATE 50 MG PO TABS      TAKE 2 TABLETS BY MOUTH EVERY NIGHT AT BEDTIME   60 tablet   0   . TRIAMCINOLONE ACETONIDE 0.1 % EX OINT   Topical   Apply topically 2 (two) times daily.   90 g   3   . ZARAH 3-0.03 MG PO TABS      TAKE 1 TABLET BY MOUTH EVERY DAY   28 tablet   0     BP 118/82  Pulse 99  Temp 98.7 F (37.1 C) (Oral)  Resp 18  Ht 5\' 3"  (1.6 m)  Wt 209 lb (94.802 kg)  BMI 37.02 kg/m2  SpO2 98%  LMP 06/19/2012  Physical Exam  Nursing note and vitals reviewed. Constitutional: No distress.       Obese   HENT:  Head: Normocephalic and atraumatic.  Right Ear: External ear normal.  Left Ear: External ear normal.  Eyes: Conjunctivae normal are normal. Right eye exhibits no  discharge. Left eye exhibits no discharge. No scleral icterus.  Neck: Normal range of motion. Neck supple. No tracheal deviation present.  Cardiovascular: Normal rate, regular rhythm and intact distal pulses.   Pulmonary/Chest: Effort normal and breath sounds normal. No stridor. No respiratory distress. She has no wheezes. She has no rales.  Abdominal: Soft. Bowel sounds are normal. She exhibits no distension. There is no tenderness. There is no rebound and no guarding.  Musculoskeletal: She exhibits no edema and no tenderness.  Neurological: She is alert. She has normal strength. No sensory deficit. Cranial nerve deficit:  no gross defecits noted. She exhibits normal muscle tone. She displays no seizure activity. Coordination normal.  Skin: Skin is warm and dry. No rash noted.  Psychiatric: She has a normal mood and affect.    ED Course  Procedures (including critical care time) Medications administered in the emergency department 0.9 % sodium chloride infusion Continuous Discontinue Last MAR action: New Bag/Given  Candida Vetter R   Route: Intravenous           07/10/12 0745    metoCLOPramide (REGLAN) injection 10 mg Once  Last MAR action: Given  Syriah Delisi R      Route: Intravenous Ordered Dose: 10 mg              07/10/12 0736    HYDROmorphone (DILAUDID) injection 1 mg Every 30 min PRN  Last MAR action: Given  Christabelle Hanzlik R      Route: Intravenous Ordered Dose: 1 mg                     Labs Reviewed - No data to display No results found.   1. Intractable migraine       MDM  The patient improved after treatment in the emergency department. Her headaches are typical for her prior migraine headaches she has had in the past. She has no acute neurologic symptoms to suggest emergency condition such as subarachnoid hemorrhage, meningitis or tumor. Patient is comfortable with outpatient follow up with her primary Dr. and neurologist. Recommended she contact them to  let them know that she  is unable to afford the medications prescribed to him previously.        Celene Kras, MD 07/10/12 1057

## 2012-07-10 NOTE — ED Notes (Signed)
Pt has hx of headaches, sees neurologist for them. Pt states she has taken Excedrin x 30 mins prior to arrival and says it doesn't do anything. Pt states headache x 4 days, c/o nausea.

## 2012-07-12 ENCOUNTER — Ambulatory Visit (INDEPENDENT_AMBULATORY_CARE_PROVIDER_SITE_OTHER): Payer: Medicare Other | Admitting: Family Medicine

## 2012-07-12 ENCOUNTER — Encounter: Payer: Self-pay | Admitting: Family Medicine

## 2012-07-12 VITALS — BP 118/80 | HR 107 | Temp 98.3°F | Ht 61.0 in | Wt 220.0 lb

## 2012-07-12 DIAGNOSIS — G43909 Migraine, unspecified, not intractable, without status migrainosus: Secondary | ICD-10-CM

## 2012-07-12 DIAGNOSIS — R51 Headache: Secondary | ICD-10-CM

## 2012-07-12 MED ORDER — KETOROLAC TROMETHAMINE 60 MG/2ML IM SOLN
60.0000 mg | Freq: Once | INTRAMUSCULAR | Status: AC
  Administered 2012-07-12: 60 mg via INTRAMUSCULAR

## 2012-07-12 MED ORDER — SUMATRIPTAN SUCCINATE 100 MG PO TABS: ORAL_TABLET | ORAL | Status: DC

## 2012-07-12 NOTE — Progress Notes (Signed)
  Subjective:    Patient ID: Gina Cameron, female    DOB: 04-30-1986, 27 y.o.   MRN: [id number]  HPI Migraine- went to ER 1/20.  Hx of similar.  Seen at [facility]- was told to stop smoking, quit caffeine, 'a whole lotta things she didn't do'.  Reports some improvement since ER visit.  No nausea.  + phonophobia, minimal photophobia.  No dizziness.  HA is frontal and bitemporal.  Feels similar to other migraines.  Will take excedrin migraine- no relief.  Was given script for Maxalt but Medicaid won't pay unless generic.   Review of Systems For ROS see HPI     Objective:   Physical Exam  Vitals reviewed. Constitutional: She is oriented to person, place, and time. She appears well-developed and well-nourished. No distress.  HENT:  Head: Normocephalic and atraumatic.       TMs WNL No TTP over sinuses Minimal nasal congestion  Eyes: Conjunctivae normal and EOM are normal. Pupils are equal, round, and reactive to light.  Neck: Normal range of motion. Neck supple.  Cardiovascular: Normal rate, regular rhythm, normal heart sounds and intact distal pulses.   Pulmonary/Chest: Effort normal and breath sounds normal. No respiratory distress. She has no wheezes. She has no rales.  Lymphadenopathy:    She has no cervical adenopathy.  Neurological: She is alert and oriented to person, place, and time. She has normal reflexes. No cranial nerve deficit. Coordination normal.  Psychiatric: She has a normal mood and affect. Her behavior is normal. Judgment and thought content normal.          Assessment & Plan:

## 2012-07-12 NOTE — Patient Instructions (Addendum)
Take the Imitrex as needed for migraine Drink plenty of fluids REST! Follow the instructions from neuro Hang in there!

## 2012-07-16 NOTE — Assessment & Plan Note (Signed)
Pt's HA w/ migrainous features.  toradol injxn given in office.  Script for imitrex sent.  Reviewed supportive care and red flags that should prompt return.  Pt expressed understanding and is in agreement w/ plan.

## 2012-07-27 ENCOUNTER — Encounter: Payer: Managed Care, Other (non HMO) | Admitting: Family Medicine

## 2012-07-27 DIAGNOSIS — Z0289 Encounter for other administrative examinations: Secondary | ICD-10-CM

## 2012-08-04 ENCOUNTER — Other Ambulatory Visit: Payer: Self-pay | Admitting: Family Medicine

## 2012-09-27 ENCOUNTER — Other Ambulatory Visit: Payer: Self-pay | Admitting: Family Medicine

## 2012-10-31 ENCOUNTER — Other Ambulatory Visit: Payer: Self-pay | Admitting: Family Medicine

## 2012-11-09 ENCOUNTER — Other Ambulatory Visit: Payer: Self-pay | Admitting: Family Medicine

## 2012-11-11 ENCOUNTER — Other Ambulatory Visit: Payer: Self-pay | Admitting: Family Medicine

## 2012-11-14 NOTE — Telephone Encounter (Signed)
Med filled.  

## 2012-12-05 ENCOUNTER — Other Ambulatory Visit: Payer: Self-pay | Admitting: Family Medicine

## 2012-12-15 ENCOUNTER — Other Ambulatory Visit: Payer: Self-pay | Admitting: Family Medicine

## 2013-01-02 ENCOUNTER — Emergency Department (HOSPITAL_COMMUNITY)
Admission: EM | Admit: 2013-01-02 | Discharge: 2013-01-02 | Disposition: A | Discharge status: Home / Self Care | Payer: Medicare Other | Attending: Emergency Medicine | Admitting: Emergency Medicine

## 2013-01-02 ENCOUNTER — Encounter (HOSPITAL_COMMUNITY): Payer: Self-pay

## 2013-01-02 DIAGNOSIS — Z872 Personal history of diseases of the skin and subcutaneous tissue: Secondary | ICD-10-CM | POA: Insufficient documentation

## 2013-01-02 DIAGNOSIS — K219 Gastro-esophageal reflux disease without esophagitis: Secondary | ICD-10-CM | POA: Insufficient documentation

## 2013-01-02 DIAGNOSIS — F17200 Nicotine dependence, unspecified, uncomplicated: Secondary | ICD-10-CM | POA: Diagnosis not present

## 2013-01-02 DIAGNOSIS — F329 Major depressive disorder, single episode, unspecified: Secondary | ICD-10-CM | POA: Insufficient documentation

## 2013-01-02 DIAGNOSIS — Z79899 Other long term (current) drug therapy: Secondary | ICD-10-CM | POA: Diagnosis not present

## 2013-01-02 DIAGNOSIS — Z87728 Personal history of other specified (corrected) congenital malformations of nervous system and sense organs: Secondary | ICD-10-CM | POA: Insufficient documentation

## 2013-01-02 DIAGNOSIS — K089 Disorder of teeth and supporting structures, unspecified: Secondary | ICD-10-CM | POA: Insufficient documentation

## 2013-01-02 DIAGNOSIS — G43909 Migraine, unspecified, not intractable, without status migrainosus: Secondary | ICD-10-CM | POA: Insufficient documentation

## 2013-01-02 DIAGNOSIS — F411 Generalized anxiety disorder: Secondary | ICD-10-CM | POA: Insufficient documentation

## 2013-01-02 DIAGNOSIS — G40909 Epilepsy, unspecified, not intractable, without status epilepticus: Secondary | ICD-10-CM | POA: Diagnosis not present

## 2013-01-02 DIAGNOSIS — Z880 Allergy status to penicillin: Secondary | ICD-10-CM | POA: Insufficient documentation

## 2013-01-02 DIAGNOSIS — F3289 Other specified depressive episodes: Secondary | ICD-10-CM | POA: Insufficient documentation

## 2013-01-02 HISTORY — DX: Other phakomatoses, not elsewhere classified: Q85.8

## 2013-01-02 HISTORY — DX: Other phakomatoses, not elsewhere classified: Q85.89

## 2013-01-02 MED ORDER — METOCLOPRAMIDE HCL 5 MG/ML IJ SOLN
10.0000 mg | Freq: Once | INTRAMUSCULAR | Status: AC
  Administered 2013-01-02: 10 mg via INTRAMUSCULAR
  Filled 2013-01-02: qty 2

## 2013-01-02 MED ORDER — METOCLOPRAMIDE HCL 10 MG PO TABS: 10.0000 mg | ORAL_TABLET | Freq: Four times a day (QID) | ORAL | Status: DC

## 2013-01-02 MED ORDER — AZITHROMYCIN 250 MG PO TABS: 250.0000 mg | ORAL_TABLET | Freq: Every day | ORAL | Status: DC

## 2013-01-02 MED ORDER — KETOROLAC TROMETHAMINE 60 MG/2ML IM SOLN
60.0000 mg | Freq: Once | INTRAMUSCULAR | Status: AC
  Administered 2013-01-02: 60 mg via INTRAMUSCULAR
  Filled 2013-01-02: qty 2

## 2013-01-02 MED ORDER — NAPROXEN 500 MG PO TABS: 500.0000 mg | ORAL_TABLET | Freq: Two times a day (BID) | ORAL | Status: DC

## 2013-01-02 MED ORDER — AZITHROMYCIN 250 MG PO TABS
500.0000 mg | ORAL_TABLET | Freq: Once | ORAL | Status: AC
  Administered 2013-01-02: 500 mg via ORAL
  Filled 2013-01-02: qty 2

## 2013-01-02 NOTE — ED Provider Notes (Signed)
History    CSN: [id number] Arrival date & time 01/02/13  1301  First MD Initiated Contact with Patient 01/02/13 1312     Chief Complaint  Patient presents with  . Migraine    Patient states that her migraine started yesterday as a regular headache and it go worse this morning. Pt states that headache is worse with light exposure.    (Consider location/radiation/quality/duration/timing/severity/associated sxs/prior Treatment) HPI Comments: 27 year old female with a history of chronic headaches who is followed by headache specialist at [facility], takes multiple medications for headaches including Imitrex and anti-inflammatories. She states that her headache started approximately 2 days ago, it is fluctuating in intensity but fairly constant, not improving with home medications. She denies fevers chills but does have mild nausea, states that her bilateral frontal and maxillary sinuses hurt and she has upper dental pain diffusely across her maxilla. This is similar to prior headaches, denies stiff neck or fevers, no focal neurologic deficits. The mother accompanies the patient and gives additional history in that this is fairly common for the patient had headaches like this.  Patient is a 27 y.o. female presenting with migraines. The history is provided by the patient.  Migraine   Past Medical History  Diagnosis Date  . Seizures   . Reflux   . Depression   . Anxiety   . GERD (gastroesophageal reflux disease)   . Hives   . Sturge-Weber syndrome    Past Surgical History  Procedure Laterality Date  . Cholecystectomy     Family History  Problem Relation Age of Onset  . Hypertension Father   . Hyperlipidemia Father    History  Substance Use Topics  . Smoking status: Current Some Day Smoker -- 0.10 packs/day for 2 years    Types: Cigarettes  . Smokeless tobacco: Never Used  . Alcohol Use: Yes     Comment: Socially   OB History   Grav Para Term Preterm Abortions TAB SAB  Ect Mult Living                 Review of Systems  All other systems reviewed and are negative.    Allergies  Lidocaine-epinephrine; Mepivacaine hcl; Diphenhydramine hcl; and Penicillins  Home Medications   Current Outpatient Rx  Name  Route  Sig  Dispense  Refill  . aspirin-acetaminophen-caffeine (EXCEDRIN MIGRAINE) 250-250-65 MG per tablet   Oral   Take 1 tablet by mouth as needed.         . Aspirin-Acetaminophen-Caffeine (PAMPRIN MAX PO)   Oral   Take 1 tablet by mouth every 6 (six) hours as needed.         Marland Kitchen buPROPion (WELLBUTRIN XL) 150 MG 24 hr tablet   Oral   Take 1 tablet (150 mg total) by mouth daily.   30 tablet   3   . drospirenone-ethinyl estradiol (ZARAH) 3-0.03 MG tablet      1 tab by mouth daily---office visit is due now   28 tablet   0   . fluticasone (FLONASE) 50 MCG/ACT nasal spray   Nasal   Place 2 sprays into the nose daily.   16 g   6   . hydrOXYzine (ATARAX/VISTARIL) 25 MG tablet      TAKE 1 TABLET BY MOUTH FOUR TIMES DAILY AS NEEDED FOR ITCHING   60 tablet   0   . mometasone (NASONEX) 50 MCG/ACT nasal spray   Nasal   Place 2 sprays into the nose daily as needed.         Marland Kitchen  pantoprazole (PROTONIX) 40 MG tablet      TAKE 1 TABLET BY MOUTH DAILY   30 tablet   0   . SUMAtriptan (IMITREX) 100 MG tablet      1/2-1 tab x1 dose.  Repeat in 2 hrs if needed.  No more than 2 tabs in 24 hrs.   10 tablet   3   . triamcinolone ointment (KENALOG) 0.1 %   Topical   Apply topically 2 (two) times daily.   90 g   3   . azithromycin (ZITHROMAX Z-PAK) 250 MG tablet   Oral   Take 1 tablet (250 mg total) by mouth daily. 500mg  PO day 1, then 250mg  PO days 205   6 tablet   0   . citalopram (CELEXA) 40 MG tablet   Oral   Take 1 tablet (40 mg total) by mouth daily.   30 tablet   3   . guaiFENesin-codeine (ROBITUSSIN AC) 100-10 MG/5ML syrup   Oral   Take 10 mLs by mouth 3 (three) times daily as needed for cough.   240 mL   0   .  metoCLOPramide (REGLAN) 10 MG tablet   Oral   Take 1 tablet (10 mg total) by mouth every 6 (six) hours.   30 tablet   0   . naproxen (NAPROSYN) 500 MG tablet   Oral   Take 1 tablet (500 mg total) by mouth 2 (two) times daily with a meal.   30 tablet   0   . SUMAtriptan (IMITREX) 100 MG tablet   Oral   Take 50-100 mg by mouth every 2 (two) hours as needed for migraine.         . topiramate (TOPAMAX) 50 MG tablet      TAKE 2 TABLETS AT BEDTIME   60 tablet   0    BP 107/61  Pulse 82  Temp(Src) 98.2 F (36.8 C) (Oral)  Resp 18  Ht 5' 2.5" (1.588 m)  Wt 237 lb (107.502 kg)  BMI 42.63 kg/m2  SpO2 100%  LMP 12/26/2012 Physical Exam  Nursing note and vitals reviewed. Constitutional: She appears well-developed and well-nourished. No distress.  HENT:  Head: Normocephalic and atraumatic.  Mouth/Throat: Oropharynx is clear and moist. No oropharyngeal exudate.  Sinus tenderness over the bilateral frontal and maxillary sinuses, dentition is in good repair, no obvious periapical abscesses or gingival abscesses. No dental tenderness, oropharynx is clear and moist, no asymmetry exudate hypertrophy or erythema. Tympanic membranes clear bilaterally, nasal passages clear bilaterally  Eyes: Conjunctivae and EOM are normal. Pupils are equal, round, and reactive to light. Right eye exhibits no discharge. Left eye exhibits no discharge. No scleral icterus.  Neck: Normal range of motion. Neck supple. No JVD present. No thyromegaly present.  Very supple neck without any difficulty  Cardiovascular: Normal rate, regular rhythm, normal heart sounds and intact distal pulses.  Exam reveals no gallop and no friction rub.   No murmur heard. Pulmonary/Chest: Effort normal and breath sounds normal. No respiratory distress. She has no wheezes. She has no rales.  Abdominal: Soft. Bowel sounds are normal. She exhibits no distension and no mass. There is no tenderness.  Musculoskeletal: Normal range of  motion. She exhibits no edema and no tenderness.  Lymphadenopathy:    She has no cervical adenopathy.  Neurological: She is alert. Coordination normal.  Speech is clear, movements or bursitis without any ataxia, all 4 extremities with normal strength and sensation, cranial nerves II through XII intact.  Skin: Skin is warm and dry. No rash noted. No erythema.  Psychiatric: She has a normal mood and affect. Her behavior is normal.    ED Course  Procedures (including critical care time) Labs Reviewed - No data to display No results found. 1. Migraine     MDM  The patient has a headache, her vital signs are reassuring, she does have mild hypotension at 95/36 but no tachycardia, no fever, no stiff neck. Neurologic exam is normal, we'll proceed with medications and will treat with antibiotics were likely sinusitis.  Blood pressure has improved, the patient appears well, she states that her headache is improved and she no longer has any nausea. I doubt that there is a secondary cause for the patient's symptoms and she likely has a primary headache. I discussed care including antibiotics with the patient, she has expressed her understanding as have the family members and she appears stable for discharge   Meds given in ED:  Medications  ketorolac (TORADOL) injection 60 mg (60 mg Intramuscular Given 01/02/13 1345)  metoCLOPramide (REGLAN) injection 10 mg (10 mg Intramuscular Given 01/02/13 1339)  azithromycin (ZITHROMAX) tablet 500 mg (500 mg Oral Given 01/02/13 1349)    New Prescriptions   AZITHROMYCIN (ZITHROMAX Z-PAK) 250 MG TABLET    Take 1 tablet (250 mg total) by mouth daily. 500mg  PO day 1, then 250mg  PO days 205   METOCLOPRAMIDE (REGLAN) 10 MG TABLET    Take 1 tablet (10 mg total) by mouth every 6 (six) hours.   NAPROXEN (NAPROSYN) 500 MG TABLET    Take 1 tablet (500 mg total) by mouth 2 (two) times daily with a meal.      Vida Roller, MD 01/02/13 [phone]

## 2013-01-02 NOTE — ED Notes (Signed)
Pt states that her headache started yesterday and progressively got worse this morning. Pt stated that her headache is worse with light exposure. No vomiting. Pt states that she is a little sick on her stomach. Pt states that she has a history of migraines x 6 years.

## 2013-01-12 ENCOUNTER — Ambulatory Visit (INDEPENDENT_AMBULATORY_CARE_PROVIDER_SITE_OTHER): Payer: Medicare Other | Admitting: Family Medicine

## 2013-01-12 ENCOUNTER — Encounter: Payer: Self-pay | Admitting: Family Medicine

## 2013-01-12 VITALS — BP 128/100 | HR 76 | Temp 98.3°F | Ht 61.0 in | Wt 225.4 lb

## 2013-01-12 DIAGNOSIS — N898 Other specified noninflammatory disorders of vagina: Secondary | ICD-10-CM

## 2013-01-12 DIAGNOSIS — R1084 Generalized abdominal pain: Secondary | ICD-10-CM | POA: Insufficient documentation

## 2013-01-12 DIAGNOSIS — H109 Unspecified conjunctivitis: Secondary | ICD-10-CM | POA: Diagnosis not present

## 2013-01-12 LAB — CBC WITH DIFFERENTIAL/PLATELET
Basophils Relative: 1 % (ref 0–1)
HCT: 40.2 % (ref 36.0–46.0)
Hemoglobin: 13.1 g/dL (ref 12.0–15.0)
Lymphocytes Relative: 57 % — ABNORMAL HIGH (ref 12–46)
Monocytes Absolute: 0.5 10*3/uL (ref 0.1–1.0)
Monocytes Relative: 7 % (ref 3–12)
Neutro Abs: 1.9 10*3/uL (ref 1.7–7.7)
Neutrophils Relative %: 28 % — ABNORMAL LOW (ref 43–77)
RBC: 4.81 MIL/uL (ref 3.87–5.11)
WBC: 6.8 10*3/uL (ref 4.0–10.5)

## 2013-01-12 MED ORDER — POLYMYXIN B-TRIMETHOPRIM 10000-0.1 UNIT/ML-% OP SOLN: OPHTHALMIC | Status: DC

## 2013-01-12 MED ORDER — FLUCONAZOLE 150 MG PO TABS: 150.0000 mg | ORAL_TABLET | Freq: Once | ORAL | Status: DC

## 2013-01-12 MED ORDER — METOCLOPRAMIDE HCL 10 MG PO TABS: 10.0000 mg | ORAL_TABLET | Freq: Four times a day (QID) | ORAL | Status: DC

## 2013-01-12 MED ORDER — PANTOPRAZOLE SODIUM 40 MG PO TBEC: DELAYED_RELEASE_TABLET | ORAL | Status: DC

## 2013-01-12 NOTE — Patient Instructions (Addendum)
We'll notify you of your lab results and make any changes if needed Start Gas-x for abdominal pain/bloating Start the Diflucan for the yeast Start the eye drops for the pink eye Call with any questions or concerns Hang in there!!!

## 2013-01-12 NOTE — Assessment & Plan Note (Signed)
New to provider.  Pt has seen eye doctor and taken oral abx.  Eye still inflamed and symptomatic.  Start topical abx.  Reviewed supportive care and red flags that should prompt return.  Pt expressed understanding and is in agreement w/ plan.

## 2013-01-12 NOTE — Assessment & Plan Note (Signed)
New.  Wet prep collected.  Pt's sxs consistent w/ yeast- start oral diflucan.

## 2013-01-12 NOTE — Progress Notes (Signed)
  Subjective:    Patient ID: Gina Cameron, female    DOB: Aug 17, 1985, 27 y.o.   MRN: [id number]  HPI Vaginal itching- started while taking oral abx for eye infxn.  'feels goopy down there'.  sxs started 'a couple of days' ago.  Hx of similar w/ abx, feels the same as previous.  + vaginal d/c- white.  abd pain- started today.  Pain is bilateral and radiates across the center of the abdomen.  + nausea but this is baseline for pt.  No vomiting.  Loose stool yesterday.  No fevers.  No known sick contacts.  Eating normally- pain not associated w/ food.  Pink eye- pt reports she has seen her eye doctor and was prescribed oral abx but continues to have eye redness, irritation, blurry vision, itching.  L eye only.  R eye not affected.   Review of Systems For ROS see HPI     Objective:   Physical Exam  Vitals reviewed. Constitutional: She appears well-developed and well-nourished. No distress.  HENT:  Head: Normocephalic and atraumatic.  Eyes: EOM are normal. Pupils are equal, round, and reactive to light.  L conjunctival injxn w/out purulent drainage  Cardiovascular: Normal rate, regular rhythm and normal heart sounds.   Pulmonary/Chest: Effort normal and breath sounds normal. No respiratory distress. She has no wheezes. She has no rales.  Abdominal: Soft. Bowel sounds are normal. She exhibits distension. There is tenderness (diffuse tenderness across central abdomen). There is no rebound and no guarding.  Genitourinary: Vaginal discharge (white, thick vaginal d/c) found.          Assessment & Plan:

## 2013-01-12 NOTE — Assessment & Plan Note (Signed)
New.  Suspect this is gas related but will check labs to r/o infxn as pt has had similar sxs previously.  Refill on Reglan, Protonix.  Encouraged Gas-X.  Will follow.

## 2013-01-13 LAB — BASIC METABOLIC PANEL
CO2: 23 mEq/L (ref 19–32)
Calcium: 9.4 mg/dL (ref 8.4–10.5)
Creat: 0.83 mg/dL (ref 0.50–1.10)
Sodium: 138 mEq/L (ref 135–145)

## 2013-01-13 LAB — HEPATIC FUNCTION PANEL
ALT: 31 U/L (ref 0–35)
AST: 18 U/L (ref 0–37)
Bilirubin, Direct: 0.1 mg/dL (ref 0.0–0.3)
Indirect Bilirubin: 0.2 mg/dL (ref 0.0–0.9)
Total Bilirubin: 0.3 mg/dL (ref 0.3–1.2)

## 2013-01-15 ENCOUNTER — Encounter: Payer: Self-pay | Admitting: *Deleted

## 2013-01-15 LAB — H. PYLORI ANTIBODY, IGG: H Pylori IgG: <0.4 {ISR}

## 2013-01-16 LAB — WET PREP BY MOLECULAR PROBE
Candida species: NEGATIVE
Gardnerella vaginalis: POSITIVE — AB

## 2013-01-26 ENCOUNTER — Other Ambulatory Visit: Payer: Self-pay | Admitting: *Deleted

## 2013-01-26 DIAGNOSIS — B9689 Other specified bacterial agents as the cause of diseases classified elsewhere: Secondary | ICD-10-CM

## 2013-01-26 MED ORDER — METRONIDAZOLE 500 MG PO TABS: 500.0000 mg | ORAL_TABLET | Freq: Two times a day (BID) | ORAL | Status: DC

## 2013-01-26 NOTE — Telephone Encounter (Signed)
Rx fro flagyl sent to [facility], left message on pts voice mail to return our call. (Did not feel it was appropriate to leave a detailed message in this situation)

## 2013-01-30 ENCOUNTER — Other Ambulatory Visit: Payer: Self-pay | Admitting: Family Medicine

## 2013-03-20 ENCOUNTER — Other Ambulatory Visit: Payer: Self-pay | Admitting: Family Medicine

## 2013-03-21 ENCOUNTER — Other Ambulatory Visit: Payer: Self-pay | Admitting: *Deleted

## 2013-03-21 DIAGNOSIS — K219 Gastro-esophageal reflux disease without esophagitis: Secondary | ICD-10-CM

## 2013-03-21 MED ORDER — PANTOPRAZOLE SODIUM 40 MG PO TBEC: DELAYED_RELEASE_TABLET | ORAL | Status: DC

## 2013-03-21 NOTE — Telephone Encounter (Signed)
Refill for protonix sent to [facility] 

## 2013-03-23 ENCOUNTER — Ambulatory Visit (INDEPENDENT_AMBULATORY_CARE_PROVIDER_SITE_OTHER): Payer: Managed Care, Other (non HMO) | Admitting: Family Medicine

## 2013-03-23 ENCOUNTER — Encounter: Payer: Self-pay | Admitting: Family Medicine

## 2013-03-23 DIAGNOSIS — F329 Major depressive disorder, single episode, unspecified: Secondary | ICD-10-CM

## 2013-03-23 MED ORDER — VORTIOXETINE HBR 10 MG PO TABS: 1.0000 | ORAL_TABLET | Freq: Every day | ORAL | Status: DC

## 2013-03-23 NOTE — Patient Instructions (Signed)
Depression  You have signs of depression. This is a common problem. It can occur at any age. It is often hard to recognize. People can suffer from depression and still have moments of enjoyment. Depression interferes with your basic ability to function in life. It upsets your relationships, sleep, eating, and work habits.  CAUSES   Depression is believed to be caused by an imbalance in brain chemicals. It may be triggered by an unpleasant event. Relationship crises, a death in the family, financial worries, retirement, or other stressors are normal causes of depression. Depression may also start for no known reason. Other factors that may play a part include medical illnesses, some medicines, genetics, and alcohol or drug abuse.  SYMPTOMS    Feeling unhappy or worthless.   Long-lasting (chronic) tiredness or worn-out feeling.   Self-destructive thoughts and actions.   Not being able to sleep or sleeping too much.   Eating more than usual or not eating at all.   Headaches or feeling anxious.   Trouble concentrating or making decisions.   Unexplained physical problems and substance abuse.  TREATMENT   Depression usually gets better with treatment. This can include:   Antidepressant medicines. It can take weeks before the proper dose is achieved and benefits are reached.   Talking with a therapist, clergyperson, counselor, or friend. These people can help you gain insight into your problem and regain control of your life.   Eating a good diet.   Getting regular physical exercise, such as walking for 30 minutes every day.   Not abusing alcohol or drugs.  Treating depression often takes 6 months or longer. This length of treatment is needed to keep symptoms from returning. Call your caregiver and arrange for follow-up care as suggested.  SEEK IMMEDIATE MEDICAL CARE IF:    You start to have thoughts of hurting yourself or others.   Call your local emergency services (911 in [place]   Go to your local  medical emergency department.   Call the National Suicide Prevention Lifeline: 1-800-273-TALK (1-800-273-8255).  Document Released: 06/07/2005 Document Revised: 05/27/2011 Document Reviewed: 11/07/2009  [employer] Patient Information 2012 [employer].

## 2013-03-24 ENCOUNTER — Encounter: Payer: Self-pay | Admitting: Family Medicine

## 2013-03-24 NOTE — Progress Notes (Signed)
  Subjective:    Patient ID: Gina Cameron, female    DOB: 03-02-1986, 27 y.o.   MRN: [id number]  HPI Pt here with her dad c/o inc depression symptoms.  She is not suicidal.   She is also frustrated about struggling to lose weight.  She is not exercising and eats large amounts of food per dad.  She was interested in bariatric surgery.   Review of Systems As above    Objective:   Physical Exam BP 124/80  Pulse 86  Temp(Src) 98.4 F (36.9 C) (Oral)  Wt 233 lb 12.8 oz (106.051 kg)  BMI 44.2 kg/m2  SpO2 98% General appearance: alert, cooperative, appears stated age and no distress Lungs: clear to auscultation bilaterally Heart: S1, S2 normal Psych-- inc depression, teary eyed,  Not suicidal        Assessment & Plan:

## 2013-03-24 NOTE — Assessment & Plan Note (Signed)
Discussed with pt and father diet and exeercise Did not recommend surgery until pt learned how to eat better

## 2013-03-24 NOTE — Assessment & Plan Note (Signed)
meds per orders rto 1 month or sooner prn

## 2013-03-26 ENCOUNTER — Telehealth: Payer: Self-pay | Admitting: Family Medicine

## 2013-03-26 NOTE — Telephone Encounter (Signed)
Patient needs her Hydroxyzine rx and birth control rx sent to her pharmacy on file. She had an appointment on Friday, 03/23/13. Please advise.

## 2013-03-27 MED ORDER — HYDROXYZINE HCL 25 MG PO TABS: ORAL_TABLET | ORAL | Status: DC

## 2013-03-27 MED ORDER — DROSPIRENONE-ETHINYL ESTRADIOL 3-0.03 MG PO TABS: ORAL_TABLET | ORAL | Status: DC

## 2013-04-10 ENCOUNTER — Telehealth: Payer: Self-pay | Admitting: Family Medicine

## 2013-04-18 NOTE — Telephone Encounter (Signed)
Spoke with patient and she stated she was given the Rx for Celexa at the pharmacy along with her other med's and she wanted to know if she should take it. I advised she should not be taking the Celexa at all. Patient voiced understanding.     KP

## 2013-04-18 NOTE — Telephone Encounter (Signed)
Patient is calling to inquire about whether she should still be taking Citalopram (Celexa). Please advise.

## 2013-04-27 ENCOUNTER — Encounter: Payer: Self-pay | Admitting: Family Medicine

## 2013-04-27 ENCOUNTER — Ambulatory Visit (INDEPENDENT_AMBULATORY_CARE_PROVIDER_SITE_OTHER): Payer: Managed Care, Other (non HMO) | Admitting: Family Medicine

## 2013-04-27 VITALS — BP 121/87 | HR 85 | Temp 98.8°F | Wt 229.6 lb

## 2013-04-27 DIAGNOSIS — G43909 Migraine, unspecified, not intractable, without status migrainosus: Secondary | ICD-10-CM

## 2013-04-27 DIAGNOSIS — F3289 Other specified depressive episodes: Secondary | ICD-10-CM

## 2013-04-27 DIAGNOSIS — Z0000 Encounter for general adult medical examination without abnormal findings: Secondary | ICD-10-CM

## 2013-04-27 DIAGNOSIS — G43009 Migraine without aura, not intractable, without status migrainosus: Secondary | ICD-10-CM

## 2013-04-27 DIAGNOSIS — F329 Major depressive disorder, single episode, unspecified: Secondary | ICD-10-CM

## 2013-04-27 DIAGNOSIS — Z23 Encounter for immunization: Secondary | ICD-10-CM

## 2013-04-27 DIAGNOSIS — F32A Depression, unspecified: Secondary | ICD-10-CM

## 2013-04-27 LAB — CBC WITH DIFFERENTIAL/PLATELET
Basophils Relative: 1.3 % (ref 0.0–3.0)
Eosinophils Absolute: 0.1 10*3/uL (ref 0.0–0.7)
Eosinophils Relative: 1.8 % (ref 0.0–5.0)
HCT: 41.3 % (ref 36.0–46.0)
Hemoglobin: 13.7 g/dL (ref 12.0–15.0)
Lymphocytes Relative: 46.5 % — ABNORMAL HIGH (ref 12.0–46.0)
MCHC: 33.1 g/dL (ref 30.0–36.0)
MCV: 84.4 fl (ref 78.0–100.0)
Monocytes Absolute: 0.4 10*3/uL (ref 0.1–1.0)
Monocytes Relative: 7.2 % (ref 3.0–12.0)
Neutro Abs: 2.5 10*3/uL (ref 1.4–7.7)
Neutrophils Relative %: 43.2 % (ref 43.0–77.0)
RBC: 4.9 Mil/uL (ref 3.87–5.11)
WBC: 5.9 10*3/uL (ref 4.5–10.5)

## 2013-04-27 LAB — BASIC METABOLIC PANEL
CO2: 23 mEq/L (ref 19–32)
Calcium: 9.3 mg/dL (ref 8.4–10.5)
Chloride: 106 mEq/L (ref 96–112)
Creatinine, Ser: 0.9 mg/dL (ref 0.4–1.2)
Potassium: 3.9 mEq/L (ref 3.5–5.1)
Sodium: 138 mEq/L (ref 135–145)

## 2013-04-27 LAB — POCT URINALYSIS DIPSTICK
Blood, UA: NEGATIVE
Ketones, UA: NEGATIVE
Protein, UA: NEGATIVE
Spec Grav, UA: >=1.03
Urobilinogen, UA: 0.2
pH, UA: 6

## 2013-04-27 LAB — HEPATIC FUNCTION PANEL
AST: 32 U/L (ref 0–37)
Albumin: 3.3 g/dL — ABNORMAL LOW (ref 3.5–5.2)
Bilirubin, Direct: 0 mg/dL (ref 0.0–0.3)
Total Bilirubin: 0.4 mg/dL (ref 0.3–1.2)
Total Protein: 7.6 g/dL (ref 6.0–8.3)

## 2013-04-27 LAB — LIPID PANEL
Cholesterol: 144 mg/dL (ref 0–200)
HDL: 62.4 mg/dL (ref >39.00)
LDL Cholesterol: 58 mg/dL (ref 0–99)
Triglycerides: 120 mg/dL (ref 0.0–149.0)
VLDL: 24 mg/dL (ref 0.0–40.0)

## 2013-04-27 LAB — TSH: TSH: 2.92 u[IU]/mL (ref 0.35–5.50)

## 2013-04-27 MED ORDER — SUMATRIPTAN SUCCINATE 100 MG PO TABS: ORAL_TABLET | ORAL | Status: DC

## 2013-04-27 MED ORDER — VORTIOXETINE HBR 20 MG PO TABS: 1.0000 | ORAL_TABLET | Freq: Every day | ORAL | Status: DC

## 2013-04-27 MED ORDER — TOPIRAMATE 50 MG PO TABS: ORAL_TABLET | ORAL | Status: DC

## 2013-04-27 NOTE — Progress Notes (Signed)
  Subjective:    Patient ID: Gina Cameron, female    DOB: 23-Dec-1985, 27 y.o.   MRN: [id number] Pre-visit discussion using our clinic review tool. No additional management support is needed unless otherwise documented below in the visit note.  HPI Pt here f/u depression.  Pt doing well on new med but feels like it could be better.  No new complaints.    Review of Systems As above     Objective:   Physical Exam BP 121/87  Pulse 85  Temp(Src) 98.8 F (37.1 C) (Oral)  Wt 229 lb 9.6 oz (104.146 kg)  SpO2 97% General appearance: alert, cooperative, appears stated age and no distress Psych-- not suicidal, more lively and animated       Assessment & Plan:

## 2013-04-27 NOTE — Assessment & Plan Note (Signed)
Increase Brintellix to 20 mg daily rto 1 month or sooner prn

## 2013-04-27 NOTE — Patient Instructions (Signed)

## 2013-04-27 NOTE — Assessment & Plan Note (Signed)
Refill meds

## 2013-05-12 ENCOUNTER — Other Ambulatory Visit: Payer: Self-pay | Admitting: Family Medicine

## 2013-05-14 NOTE — Telephone Encounter (Signed)
Med filled.  

## 2013-05-16 ENCOUNTER — Ambulatory Visit (INDEPENDENT_AMBULATORY_CARE_PROVIDER_SITE_OTHER): Payer: Managed Care, Other (non HMO) | Admitting: Internal Medicine

## 2013-05-16 ENCOUNTER — Encounter: Payer: Self-pay | Admitting: Internal Medicine

## 2013-05-16 VITALS — BP 139/71 | HR 93 | Temp 98.4°F | Wt 232.0 lb

## 2013-05-16 DIAGNOSIS — N390 Urinary tract infection, site not specified: Secondary | ICD-10-CM

## 2013-05-16 LAB — POCT URINALYSIS DIPSTICK
Bilirubin, UA: NEGATIVE
Glucose, UA: NEGATIVE
Ketones, UA: NEGATIVE
Nitrite, UA: NEGATIVE
pH, UA: 6.5

## 2013-05-16 MED ORDER — CIPROFLOXACIN HCL 500 MG PO TABS: 500.0000 mg | ORAL_TABLET | Freq: Two times a day (BID) | ORAL | Status: DC

## 2013-05-16 NOTE — Patient Instructions (Signed)
Drink plenty of fluids and take antibiotics as prescribed. Call if you are not gradually improving in few days. Call anytime if you have severe symptoms, fever, chills.

## 2013-05-16 NOTE — Progress Notes (Signed)
   Subjective:    Patient ID: Gina Cameron, female    DOB: 1986/04/03, 27 y.o.   MRN: [id number]  HPI  Acute visit, here w/ her father  Symptoms started today: Dysuria, urinary frequency ; Not recent or frequent UTIs before, no history of urolithiasis.  Past Medical History  Diagnosis Date  . Seizures   . Reflux   . Depression   . Anxiety   . GERD (gastroesophageal reflux disease)   . Hives   . Sturge-Weber syndrome    Past Surgical History  Procedure Laterality Date  . Cholecystectomy       Review of Systems Patient is on birth-control pills No fever chills Mild nausea, no vomiting or diarrhea. Mild bilateral lower abdominal pain, no flank pain. Urine looks pink in color, saw a small clot  one time. No vaginal discharge.     Objective:   Physical Exam  Abdominal:     BP 139/71  Pulse 93  Temp(Src) 98.4 F (36.9 C)  Wt 232 lb (105.235 kg)  SpO2 99% General -- alert, well-developed, NAD.  HEENT-- Not pale.   Abdomen-- Not distended, good bowel sounds,soft,  Slightly tender at the left lower quadrant, no rebound or rigidity. No mass,organomegaly. See graphic   Extremities-- no pretibial edema bilaterally  Neurologic--  alert & oriented X3. Speech normal, gait normal, strength normal in all extremities.  Psych-- Cognition and judgment appear intact. Cooperative with normal attention span and concentration. No anxious appearing , no depressed appearing.      Assessment & Plan:  UTI, Symptoms findings of the urine consistent with a UTI, urine culture pending. Plan:  Cipro Fluids Call if not improving soon.

## 2013-05-16 NOTE — Progress Notes (Signed)
Pre visit review using our clinic review tool, if applicable. No additional management support is needed unless otherwise documented below in the visit note. 

## 2013-05-18 LAB — URINE CULTURE
Colony Count: NO GROWTH
Organism ID, Bacteria: NO GROWTH

## 2013-05-22 ENCOUNTER — Ambulatory Visit: Payer: Managed Care, Other (non HMO) | Admitting: Family Medicine

## 2013-05-22 ENCOUNTER — Telehealth: Payer: Self-pay

## 2013-05-23 ENCOUNTER — Other Ambulatory Visit (HOSPITAL_COMMUNITY)
Admission: RE | Admit: 2013-05-23 | Discharge: 2013-05-23 | Disposition: A | Discharge status: Home / Self Care | Payer: Managed Care, Other (non HMO) | Source: Ambulatory Visit | Attending: Family Medicine | Admitting: Family Medicine

## 2013-05-23 ENCOUNTER — Ambulatory Visit (INDEPENDENT_AMBULATORY_CARE_PROVIDER_SITE_OTHER): Payer: Managed Care, Other (non HMO) | Admitting: Family Medicine

## 2013-05-23 ENCOUNTER — Encounter: Payer: Self-pay | Admitting: Family Medicine

## 2013-05-23 VITALS — BP 122/80 | HR 95 | Temp 98.4°F | Ht 60.0 in | Wt 231.8 lb

## 2013-05-23 DIAGNOSIS — N390 Urinary tract infection, site not specified: Secondary | ICD-10-CM

## 2013-05-23 DIAGNOSIS — Z113 Encounter for screening for infections with a predominantly sexual mode of transmission: Secondary | ICD-10-CM | POA: Diagnosis not present

## 2013-05-23 DIAGNOSIS — Z1151 Encounter for screening for human papillomavirus (HPV): Secondary | ICD-10-CM | POA: Insufficient documentation

## 2013-05-23 DIAGNOSIS — Z0000 Encounter for general adult medical examination without abnormal findings: Secondary | ICD-10-CM

## 2013-05-23 DIAGNOSIS — B373 Candidiasis of vulva and vagina: Secondary | ICD-10-CM

## 2013-05-23 DIAGNOSIS — Z124 Encounter for screening for malignant neoplasm of cervix: Secondary | ICD-10-CM

## 2013-05-23 DIAGNOSIS — N760 Acute vaginitis: Secondary | ICD-10-CM | POA: Diagnosis not present

## 2013-05-23 DIAGNOSIS — Z23 Encounter for immunization: Secondary | ICD-10-CM

## 2013-05-23 DIAGNOSIS — N898 Other specified noninflammatory disorders of vagina: Secondary | ICD-10-CM

## 2013-05-23 LAB — LIPID PANEL
Cholesterol: 165 mg/dL (ref 0–200)
LDL Cholesterol: 75 mg/dL (ref 0–99)
Total CHOL/HDL Ratio: 2
Triglycerides: 105 mg/dL (ref 0.0–149.0)
VLDL: 21 mg/dL (ref 0.0–40.0)

## 2013-05-23 LAB — CBC WITH DIFFERENTIAL/PLATELET
Basophils Absolute: 0 10*3/uL (ref 0.0–0.1)
Eosinophils Absolute: 0.1 10*3/uL (ref 0.0–0.7)
HCT: 41.8 % (ref 36.0–46.0)
Lymphs Abs: 3 10*3/uL (ref 0.7–4.0)
MCV: 85.3 fl (ref 78.0–100.0)
Monocytes Absolute: 0.5 10*3/uL (ref 0.1–1.0)
Monocytes Relative: 7.7 % (ref 3.0–12.0)
Neutrophils Relative %: 42 % — ABNORMAL LOW (ref 43.0–77.0)
Platelets: 431 10*3/uL — ABNORMAL HIGH (ref 150.0–400.0)
RDW: 14.8 % — ABNORMAL HIGH (ref 11.5–14.6)
WBC: 6.2 10*3/uL (ref 4.5–10.5)

## 2013-05-23 LAB — HEPATIC FUNCTION PANEL
Bilirubin, Direct: 0 mg/dL (ref 0.0–0.3)
Total Bilirubin: 0.2 mg/dL — ABNORMAL LOW (ref 0.3–1.2)

## 2013-05-23 LAB — BASIC METABOLIC PANEL
BUN: 7 mg/dL (ref 6–23)
Chloride: 108 mEq/L (ref 96–112)
Creatinine, Ser: 0.8 mg/dL (ref 0.4–1.2)
GFR: 115.56 mL/min (ref >60.00)
Glucose, Bld: 83 mg/dL (ref 70–99)
Potassium: 3.9 mEq/L (ref 3.5–5.1)

## 2013-05-23 MED ORDER — FLUCONAZOLE 150 MG PO TABS: ORAL_TABLET | ORAL | Status: DC

## 2013-05-23 NOTE — Assessment & Plan Note (Signed)
Pap done with cultures Diflucan sent to pharmacy

## 2013-05-23 NOTE — Progress Notes (Signed)
Subjective:     Gina Cameron is a 27 y.o. female and is here for a comprehensive physical exam. The patient reports no problems.  History   Social History  . Marital Status: Single    Spouse Name: N/A    Number of Children: N/A  . Years of Education: N/A   Occupational History  . Not on file.   Social History Main Topics  . Smoking status: Former Smoker -- 0.10 packs/day for 2 years    Types: Cigarettes    Quit date: 03/09/2013  . Smokeless tobacco: Never Used  . Alcohol Use: Yes     Comment: Socially  . Drug Use: No  . Sexual Activity: Yes    Birth Control/ Protection: Pill   Other Topics Concern  . Not on file   Social History Narrative  . No narrative on file   Health Maintenance  Topic Date Due  . Pap Smear  03/16/2006  . Tetanus/tdap  03/17/2007  . Influenza Vaccine  01/19/2014    The following portions of the patient's history were reviewed and updated as appropriate:  She  has a past medical history of Seizures; Reflux; Depression; Anxiety; GERD (gastroesophageal reflux disease); Hives; and Sturge-Weber syndrome. She  does not have any pertinent problems on file. She  has past surgical history that includes Cholecystectomy. Her family history includes Hyperlipidemia in her father; Hypertension in her father. She  reports that she quit smoking about 2 months ago. Her smoking use included Cigarettes. She has a .2 pack-year smoking history. She has never used smokeless tobacco. She reports that she drinks alcohol. She reports that she does not use illicit drugs. She has a current medication list which includes the following prescription(s): aspirin-acetaminophen-caffeine, aspirin-acetaminophen-caffeine, bupropion, hydroxyzine, pantoprazole, sumatriptan, topiramate, triamcinolone ointment, vortioxetine hbr, and zarah. Current Outpatient Prescriptions on File Prior to Visit  Medication Sig Dispense Refill  . aspirin-acetaminophen-caffeine (EXCEDRIN MIGRAINE)  250-250-65 MG per tablet Take 1 tablet by mouth as needed.      . Aspirin-Acetaminophen-Caffeine (PAMPRIN MAX PO) Take 1 tablet by mouth every 6 (six) hours as needed.      Marland Kitchen buPROPion (WELLBUTRIN XL) 150 MG 24 hr tablet TAKE 1 TABLET BY MOUTH DAILY  30 tablet  0  . hydrOXYzine (ATARAX/VISTARIL) 25 MG tablet TAKE 1 TABLET BY MOUTH FOUR TIMES DAILY AS NEEDED FOR ITCHING  60 tablet  0  . pantoprazole (PROTONIX) 40 MG tablet TAKE 1 TABLET BY MOUTH DAILY  30 tablet  5  . SUMAtriptan (IMITREX) 100 MG tablet 1/2-1 tab x1 dose.  Repeat in 2 hrs if needed.  No more than 2 tabs in 24 hrs.  10 tablet  3  . topiramate (TOPAMAX) 50 MG tablet TAKE 2 TABLETS BY MOUTH AT BEDTIME  60 tablet  5  . triamcinolone ointment (KENALOG) 0.1 % APPLY TO THE AFFECTED AREA TOPICALLY TWICE DAILY  80 g  0  . Vortioxetine HBr (BRINTELLIX) 20 MG TABS Take 1 tablet by mouth daily.  30 tablet  5  . ZARAH 3-0.03 MG tablet TAKE 1 TABLET BY MOUTH DAILY  28 tablet  0   No current facility-administered medications on file prior to visit.   She is allergic to lidocaine-epinephrine; mepivacaine hcl; diphenhydramine hcl; and penicillins.. Review of Systems  Constitutional: Negative for activity change, appetite change and fatigue.  HENT: Negative for hearing loss, congestion, tinnitus and ear discharge.  dentist q65m Eyes: Negative for visual disturbance (see optho q1y -- vision corrected to 20/20 with  glasses).  Respiratory: Negative for cough, chest tightness and shortness of breath.   Cardiovascular: Negative for chest pain, palpitations and leg swelling.  Gastrointestinal: Negative for abdominal pain, diarrhea, constipation and abdominal distention.  Genitourinary: Negative for urgency, frequency, decreased urine volume and difficulty urinating.  Musculoskeletal: Negative for back pain, arthralgias and gait problem.  Skin: Negative for color change, pallor and rash.  Neurological: Negative for dizziness, light-headedness, numbness  and headaches.  Hematological: Negative for adenopathy. Does not bruise/bleed easily.  Psychiatric/Behavioral: Negative for suicidal ideas, confusion, sleep disturbance, self-injury, dysphoric mood, decreased concentration and agitation.       Objective:    BP 122/80  Pulse 95  Temp(Src) 98.4 F (36.9 C) (Oral)  Ht 5' (1.524 m)  Wt 231 lb 12.8 oz (105.144 kg)  BMI 45.27 kg/m2  SpO2 97%  LMP 05/16/2013 General appearance: alert, cooperative, appears stated age and morbidly obese Head: Normocephalic, without obvious abnormality, atraumatic Eyes: conjunctivae/corneas clear. PERRL, EOM's intact. Fundi benign. Ears: normal TM's and external ear canals both ears Nose: Nares normal. Septum midline. Mucosa normal. No drainage or sinus tenderness. Throat: lips, mucosa, and tongue normal; teeth and gums normal Neck: no adenopathy, no carotid bruit, no JVD, supple, symmetrical, trachea midline and thyroid not enlarged, symmetric, no tenderness/mass/nodules Back: symmetric, no curvature. ROM normal. No CVA tenderness. Lungs: clear to auscultation bilaterally Breasts: normal appearance, no masses or tenderness Heart: regular rate and rhythm, S1, S2 normal, no murmur, click, rub or gallop Abdomen: soft, non-tender; bowel sounds normal; no masses,  no organomegaly Pelvic: cervix normal in appearance, external genitalia normal, no adnexal masses or tenderness, no cervical motion tenderness, positive findings: vaginal discharge:  white and thick, rectovaginal septum normal and uterus normal size, shape, and consistency Extremities: extremities normal, atraumatic, no cyanosis or edema Pulses: 2+ and symmetric Skin: Skin color, texture, turgor normal. No rashes or lesions Lymph nodes: Cervical, supraclavicular, and axillary nodes normal. Neurologic: Alert and oriented X 3, normal strength and tone. Normal symmetric reflexes. Normal coordination and gait Psych--no depression, no anxiety       Assessment:    Healthy female exam.      Plan:    check labs  ghm utd See After Visit Summary for Counseling Recommendations

## 2013-05-23 NOTE — Patient Instructions (Addendum)
rto 5 months for hep A #2 and HPV #3  Preventive Care for Adults, Female A healthy lifestyle and preventive care can promote health and wellness. Preventive health guidelines for women include the following key practices.  A routine yearly physical is a good way to check with your caregiver about your health and preventive screening. It is a chance to share any concerns and updates on your health, and to receive a thorough exam.  Visit your dentist for a routine exam and preventive care every 6 months. Brush your teeth twice a day and floss once a day. Good oral hygiene prevents tooth decay and gum disease.  The frequency of eye exams is based on your age, health, family medical history, use of contact lenses, and other factors. Follow your caregiver's recommendations for frequency of eye exams.  Eat a healthy diet. Foods like vegetables, fruits, whole grains, low-fat dairy products, and lean protein foods contain the nutrients you need without too many calories. Decrease your intake of foods high in solid fats, added sugars, and salt. Eat the right amount of calories for you.Get information about a proper diet from your caregiver, if necessary.  Regular physical exercise is one of the most important things you can do for your health. Most adults should get at least 150 minutes of moderate-intensity exercise (any activity that increases your heart rate and causes you to sweat) each week. In addition, most adults need muscle-strengthening exercises on 2 or more days a week.  Maintain a healthy weight. The body mass index (BMI) is a screening tool to identify possible weight problems. It provides an estimate of body fat based on height and weight. Your caregiver can help determine your BMI, and can help you achieve or maintain a healthy weight.For adults 20 years and older:  A BMI below 18.5 is considered underweight.  A BMI of 18.5 to 24.9 is normal.  A BMI of 25 to 29.9 is considered  overweight.  A BMI of 30 and above is considered obese.  Maintain normal blood lipids and cholesterol levels by exercising and minimizing your intake of saturated fat. Eat a balanced diet with plenty of fruit and vegetables. Blood tests for lipids and cholesterol should begin at age 59 and be repeated every 5 years. If your lipid or cholesterol levels are high, you are over 50, or you are at high risk for heart disease, you may need your cholesterol levels checked more frequently.Ongoing high lipid and cholesterol levels should be treated with medicines if diet and exercise are not effective.  If you smoke, find out from your caregiver how to quit. If you do not use tobacco, do not start.  Lung cancer screening is recommended for adults aged 14 80 years who are at high risk for developing lung cancer because of a history of smoking. Yearly low-dose computed tomography (CT) is recommended for people who have at least a 30-pack-year history of smoking and are a current smoker or have quit within the past 15 years. A pack year of smoking is smoking an average of 1 pack of cigarettes a day for 1 year (for example: 1 pack a day for 30 years or 2 packs a day for 15 years). Yearly screening should continue until the smoker has stopped smoking for at least 15 years. Yearly screening should also be stopped for people who develop a health problem that would prevent them from having lung cancer treatment.  If you are pregnant, do not drink alcohol. If you  are breastfeeding, be very cautious about drinking alcohol. If you are not pregnant and choose to drink alcohol, do not exceed 1 drink per day. One drink is considered to be 12 ounces (355 mL) of beer, 5 ounces (148 mL) of wine, or 1.5 ounces (44 mL) of liquor.  Avoid use of street drugs. Do not share needles with anyone. Ask for help if you need support or instructions about stopping the use of drugs.  High blood pressure causes heart disease and increases the  risk of stroke. Your blood pressure should be checked at least every 1 to 2 years. Ongoing high blood pressure should be treated with medicines if weight loss and exercise are not effective.  If you are 67 to 25 years old, ask your caregiver if you should take aspirin to prevent strokes.  Diabetes screening involves taking a blood sample to check your fasting blood sugar level. This should be done once every 3 years, after age 57, if you are within normal weight and without risk factors for diabetes. Testing should be considered at a younger age or be carried out more frequently if you are overweight and have at least 1 risk factor for diabetes.  Breast cancer screening is essential preventive care for women. You should practice "breast self-awareness." This means understanding the normal appearance and feel of your breasts and may include breast self-examination. Any changes detected, no matter how small, should be reported to a caregiver. Women in their 57s and 30s should have a clinical breast exam (CBE) by a caregiver as part of a regular health exam every 1 to 3 years. After age 70, women should have a CBE every year. Starting at age 53, women should consider having a mammography (breast X-ray test) every year. Women who have a family history of breast cancer should talk to their caregiver about genetic screening. Women at a high risk of breast cancer should talk to their caregivers about having magnetic resonance imaging (MRI) and a mammography every year.  Breast cancer gene (BRCA)-related cancer risk assessment is recommended for women who have family members with BRCA-related cancers. BRCA-related cancers include breast, ovarian, tubal, and peritoneal cancers. Having family members with these cancers may be associated with an increased risk for harmful changes (mutations) in the breast cancer genes BRCA1 and BRCA2. Results of the assessment will determine the need for genetic counseling and BRCA1  and BRCA2 testing.  The Pap test is a screening test for cervical cancer. A Pap test can show cell changes on the cervix that might become cervical cancer if left untreated. A Pap test is a procedure in which cells are obtained and examined from the lower end of the uterus (cervix).  Women should have a Pap test starting at age 63.  Between ages 46 and 69, Pap tests should be repeated every 2 years.  Beginning at age 89, you should have a Pap test every 3 years as long as the past 3 Pap tests have been normal.  Some women have medical problems that increase the chance of getting cervical cancer. Talk to your caregiver about these problems. It is especially important to talk to your caregiver if a new problem develops soon after your last Pap test. In these cases, your caregiver may recommend more frequent screening and Pap tests.  The above recommendations are the same for women who have or have not gotten the vaccine for human papillomavirus (HPV).  If you had a hysterectomy for a problem that was  not cancer or a condition that could lead to cancer, then you no longer need Pap tests. Even if you no longer need a Pap test, a regular exam is a good idea to make sure no other problems are starting.  If you are between ages 59 and 66, and you have had normal Pap tests going back 10 years, you no longer need Pap tests. Even if you no longer need a Pap test, a regular exam is a good idea to make sure no other problems are starting.  If you have had past treatment for cervical cancer or a condition that could lead to cancer, you need Pap tests and screening for cancer for at least 20 years after your treatment.  If Pap tests have been discontinued, risk factors (such as a new sexual partner) need to be reassessed to determine if screening should be resumed.  The HPV test is an additional test that may be used for cervical cancer screening. The HPV test looks for the virus that can cause the cell  changes on the cervix. The cells collected during the Pap test can be tested for HPV. The HPV test could be used to screen women aged 7 years and older, and should be used in women of any age who have unclear Pap test results. After the age of 54, women should have HPV testing at the same frequency as a Pap test.  Colorectal cancer can be detected and often prevented. Most routine colorectal cancer screening begins at the age of 24 and continues through age 74. However, your caregiver may recommend screening at an earlier age if you have risk factors for colon cancer. On a yearly basis, your caregiver may provide home test kits to check for hidden blood in the stool. Use of a small camera at the end of a tube, to directly examine the colon (sigmoidoscopy or colonoscopy), can detect the earliest forms of colorectal cancer. Talk to your caregiver about this at age 53, when routine screening begins. Direct examination of the colon should be repeated every 5 to 10 years through age 48, unless early forms of pre-cancerous polyps or small growths are found.  Hepatitis C blood testing is recommended for all people born from [birth year] through 1965 and any individual with known risks for hepatitis C.  Practice safe sex. Use condoms and avoid high-risk sexual practices to reduce the spread of sexually transmitted infections (STIs). STIs include gonorrhea, chlamydia, syphilis, trichomonas, herpes, HPV, and human immunodeficiency virus (HIV). Herpes, HIV, and HPV are viral illnesses that have no cure. They can result in disability, cancer, and death. Sexually active women aged 42 and younger should be checked for chlamydia. Older women with new or multiple partners should also be tested for chlamydia. Testing for other STIs is recommended if you are sexually active and at increased risk.  Osteoporosis is a disease in which the bones lose minerals and strength with aging. This can result in serious bone fractures. The risk  of osteoporosis can be identified using a bone density scan. Women ages 80 and over and women at risk for fractures or osteoporosis should discuss screening with their caregivers. Ask your caregiver whether you should take a calcium supplement or vitamin D to reduce the rate of osteoporosis.  Menopause can be associated with physical symptoms and risks. Hormone replacement therapy is available to decrease symptoms and risks. You should talk to your caregiver about whether hormone replacement therapy is right for you.  Use sunscreen. Apply  sunscreen liberally and repeatedly throughout the day. You should seek shade when your shadow is shorter than you. Protect yourself by wearing long sleeves, pants, a wide-brimmed hat, and sunglasses year round, whenever you are outdoors.  Once a month, do a whole body skin exam, using a mirror to look at the skin on your back. Notify your caregiver of new moles, moles that have irregular borders, moles that are larger than a pencil eraser, or moles that have changed in shape or color.  Stay current with required immunizations.  Influenza vaccine. All adults should be immunized every year.  Tetanus, diphtheria, and acellular pertussis (Td, Tdap) vaccine. Pregnant women should receive 1 dose of Tdap vaccine during each pregnancy. The dose should be obtained regardless of the length of time since the last dose. Immunization is preferred during the 27th to 36th week of gestation. An adult who has not previously received Tdap or who does not know her vaccine status should receive 1 dose of Tdap. This initial dose should be followed by tetanus and diphtheria toxoids (Td) booster doses every 10 years. Adults with an unknown or incomplete history of completing a 3-dose immunization series with Td-containing vaccines should begin or complete a primary immunization series including a Tdap dose. Adults should receive a Td booster every 10 years.  Varicella vaccine. An adult  without evidence of immunity to varicella should receive 2 doses or a second dose if she has previously received 1 dose. Pregnant females who do not have evidence of immunity should receive the first dose after pregnancy. This first dose should be obtained before leaving the health care facility. The second dose should be obtained 4 8 weeks after the first dose.  Human papillomavirus (HPV) vaccine. Females aged 28 26 years who have not received the vaccine previously should obtain the 3-dose series. The vaccine is not recommended for use in pregnant females. However, pregnancy testing is not needed before receiving a dose. If a female is found to be pregnant after receiving a dose, no treatment is needed. In that case, the remaining doses should be delayed until after the pregnancy. Immunization is recommended for any person with an immunocompromised condition through the age of 26 years if she did not get any or all doses earlier. During the 3-dose series, the second dose should be obtained 4 8 weeks after the first dose. The third dose should be obtained 24 weeks after the first dose and 16 weeks after the second dose.  Zoster vaccine. One dose is recommended for adults aged 32 years or older unless certain conditions are present.  Measles, mumps, and rubella (MMR) vaccine. Adults born before [birth year] generally are considered immune to measles and mumps. Adults born in [birth year] or later should have 1 or more doses of MMR vaccine unless there is a contraindication to the vaccine or there is laboratory evidence of immunity to each of the three diseases. A routine second dose of MMR vaccine should be obtained at least 28 days after the first dose for students attending postsecondary schools, health care workers, or international travelers. People who received inactivated measles vaccine or an unknown type of measles vaccine during 1963 1967 should receive 2 doses of MMR vaccine. People who received inactivated mumps  vaccine or an unknown type of mumps vaccine before 1979 and are at high risk for mumps infection should consider immunization with 2 doses of MMR vaccine. For females of childbearing age, rubella immunity should be determined. If there is no evidence of  immunity, females who are not pregnant should be vaccinated. If there is no evidence of immunity, females who are pregnant should delay immunization until after pregnancy. Unvaccinated health care workers born before [birth year] who lack laboratory evidence of measles, mumps, or rubella immunity or laboratory confirmation of disease should consider measles and mumps immunization with 2 doses of MMR vaccine or rubella immunization with 1 dose of MMR vaccine.  Pneumococcal 13-valent conjugate (PCV13) vaccine. When indicated, a person who is uncertain of her immunization history and has no record of immunization should receive the PCV13 vaccine. An adult aged 41 years or older who has certain medical conditions and has not been previously immunized should receive 1 dose of PCV13 vaccine. This PCV13 should be followed with a dose of pneumococcal polysaccharide (PPSV23) vaccine. The PPSV23 vaccine dose should be obtained at least 8 weeks after the dose of PCV13 vaccine. An adult aged 66 years or older who has certain medical conditions and previously received 1 or more doses of PPSV23 vaccine should receive 1 dose of PCV13. The PCV13 vaccine dose should be obtained 1 or more years after the last PPSV23 vaccine dose.  Pneumococcal polysaccharide (PPSV23) vaccine. When PCV13 is also indicated, PCV13 should be obtained first. All adults aged 14 years and older should be immunized. An adult younger than age 37 years who has certain medical conditions should be immunized. Any person who resides in a nursing home or long-term care facility should be immunized. An adult smoker should be immunized. People with an immunocompromised condition and certain other conditions should  receive both PCV13 and PPSV23 vaccines. People with human immunodeficiency virus (HIV) infection should be immunized as soon as possible after diagnosis. Immunization during chemotherapy or radiation therapy should be avoided. Routine use of PPSV23 vaccine is not recommended for American Indians, [address], or people younger than 65 years unless there are medical conditions that require PPSV23 vaccine. When indicated, people who have unknown immunization and have no record of immunization should receive PPSV23 vaccine. One-time revaccination 5 years after the first dose of PPSV23 is recommended for people aged 91 64 years who have chronic kidney failure, nephrotic syndrome, asplenia, or immunocompromised conditions. People who received 1 2 doses of PPSV23 before age 43 years should receive another dose of PPSV23 vaccine at age 36 years or later if at least 5 years have passed since the previous dose. Doses of PPSV23 are not needed for people immunized with PPSV23 at or after age 73 years.  Meningococcal vaccine. Adults with asplenia or persistent complement component deficiencies should receive 2 doses of quadrivalent meningococcal conjugate (MenACWY-D) vaccine. The doses should be obtained at least 2 months apart. Microbiologists working with certain meningococcal bacteria, military recruits, people at risk during an outbreak, and people who travel to or live in countries with a high rate of meningitis should be immunized. A first-year college student up through age 6 years who is living in a residence hall should receive a dose if she did not receive a dose on or after her 16th birthday. Adults who have certain high-risk conditions should receive one or more doses of vaccine.  Hepatitis A vaccine. Adults who wish to be protected from this disease, have certain high-risk conditions, work with hepatitis A-infected animals, work in hepatitis A research labs, or travel to or work in countries with a high  rate of hepatitis A should be immunized. Adults who were previously unvaccinated and who anticipate close contact with an international adoptee during the first  60 days after arrival in the [place] from a country with a high rate of hepatitis A should be immunized.  Hepatitis B vaccine. Adults who wish to be protected from this disease, have certain high-risk conditions, may be exposed to blood or other infectious body fluids, are household contacts or sex partners of hepatitis B positive people, are clients or workers in certain care facilities, or travel to or work in countries with a high rate of hepatitis B should be immunized.  Haemophilus influenzae type b (Hib) vaccine. A previously unvaccinated person with asplenia or sickle cell disease or having a scheduled splenectomy should receive 1 dose of Hib vaccine. Regardless of previous immunization, a recipient of a hematopoietic stem cell transplant should receive a 3-dose series 6 12 months after her successful transplant. Hib vaccine is not recommended for adults with HIV infection. Preventive Services / Frequency Ages 29 to 9  Blood pressure check.** / Every 1 to 2 years.  Lipid and cholesterol check.** / Every 5 years beginning at age 33.  Clinical breast exam.** / Every 3 years for women in their 28s and 30s.  BRCA-related cancer risk assessment.** / For women who have family members with a BRCA-related cancer (breast, ovarian, tubal, or peritoneal cancers).  Pap test.** / Every 2 years from ages 67 through 13. Every 3 years starting at age 41 through age 4 or 32 with a history of 3 consecutive normal Pap tests.  HPV screening.** / Every 3 years from ages 37 through ages 47 to 52 with a history of 3 consecutive normal Pap tests.  Hepatitis C blood test.** / For any individual with known risks for hepatitis C.  Skin self-exam. / Monthly.  Influenza vaccine. / Every year.  Tetanus, diphtheria, and acellular pertussis (Tdap,  Td) vaccine.** / Consult your caregiver. Pregnant women should receive 1 dose of Tdap vaccine during each pregnancy. 1 dose of Td every 10 years.  Varicella vaccine.** / Consult your caregiver. Pregnant females who do not have evidence of immunity should receive the first dose after pregnancy.  HPV vaccine. / 3 doses over 6 months, if 26 and younger. The vaccine is not recommended for use in pregnant females. However, pregnancy testing is not needed before receiving a dose.  Measles, mumps, rubella (MMR) vaccine.** / You need at least 1 dose of MMR if you were born in 1957 or later. You may also need a 2nd dose. For females of childbearing age, rubella immunity should be determined. If there is no evidence of immunity, females who are not pregnant should be vaccinated. If there is no evidence of immunity, females who are pregnant should delay immunization until after pregnancy.  Pneumococcal 13-valent conjugate (PCV13) vaccine.** / Consult your caregiver.  Pneumococcal polysaccharide (PPSV23) vaccine.** / 1 to 2 doses if you smoke cigarettes or if you have certain conditions.  Meningococcal vaccine.** / 1 dose if you are age 85 to 42 years and a Orthoptist living in a residence hall, or have one of several medical conditions, you need to get vaccinated against meningococcal disease. You may also need additional booster doses.  Hepatitis A vaccine.** / Consult your caregiver.  Hepatitis B vaccine.** / Consult your caregiver.  Haemophilus influenzae type b (Hib) vaccine.** / Consult your caregiver. Ages 54 to 16  Blood pressure check.** / Every 1 to 2 years.  Lipid and cholesterol check.** / Every 5 years beginning at age 69.  Lung cancer screening. / Every year if you are aged  55 80 years and have a 30-pack-year history of smoking and currently smoke or have quit within the past 15 years. Yearly screening is stopped once you have quit smoking for at least 15 years or develop a  health problem that would prevent you from having lung cancer treatment.  Clinical breast exam.** / Every year after age 9.  BRCA-related cancer risk assessment.** / For women who have family members with a BRCA-related cancer (breast, ovarian, tubal, or peritoneal cancers).  Mammogram.** / Every year beginning at age 56 and continuing for as long as you are in good health. Consult with your caregiver.  Pap test.** / Every 3 years starting at age 48 through age 22 or 105 with a history of 3 consecutive normal Pap tests.  HPV screening.** / Every 3 years from ages 8 through ages 62 to 36 with a history of 3 consecutive normal Pap tests.  Fecal occult blood test (FOBT) of stool. / Every year beginning at age 96 and continuing until age 4. You may not need to do this test if you get a colonoscopy every 10 years.  Flexible sigmoidoscopy or colonoscopy.** / Every 5 years for a flexible sigmoidoscopy or every 10 years for a colonoscopy beginning at age 73 and continuing until age 30.  Hepatitis C blood test.** / For all people born from [birth year] through 1965 and any individual with known risks for hepatitis C.  Skin self-exam. / Monthly.  Influenza vaccine. / Every year.  Tetanus, diphtheria, and acellular pertussis (Tdap/Td) vaccine.** / Consult your caregiver. Pregnant women should receive 1 dose of Tdap vaccine during each pregnancy. 1 dose of Td every 10 years.  Varicella vaccine.** / Consult your caregiver. Pregnant females who do not have evidence of immunity should receive the first dose after pregnancy.  Zoster vaccine.** / 1 dose for adults aged 57 years or older.  Measles, mumps, rubella (MMR) vaccine.** / You need at least 1 dose of MMR if you were born in 1957 or later. You may also need a 2nd dose. For females of childbearing age, rubella immunity should be determined. If there is no evidence of immunity, females who are not pregnant should be vaccinated. If there is no evidence of  immunity, females who are pregnant should delay immunization until after pregnancy.  Pneumococcal 13-valent conjugate (PCV13) vaccine.** / Consult your caregiver.  Pneumococcal polysaccharide (PPSV23) vaccine.** / 1 to 2 doses if you smoke cigarettes or if you have certain conditions.  Meningococcal vaccine.** / Consult your caregiver.  Hepatitis A vaccine.** / Consult your caregiver.  Hepatitis B vaccine.** / Consult your caregiver.  Haemophilus influenzae type b (Hib) vaccine.** / Consult your caregiver. Ages 11 and over  Blood pressure check.** / Every 1 to 2 years.  Lipid and cholesterol check.** / Every 5 years beginning at age 33.  Lung cancer screening. / Every year if you are aged 74 80 years and have a 30-pack-year history of smoking and currently smoke or have quit within the past 15 years. Yearly screening is stopped once you have quit smoking for at least 15 years or develop a health problem that would prevent you from having lung cancer treatment.  Clinical breast exam.** / Every year after age 29.  BRCA-related cancer risk assessment.** / For women who have family members with a BRCA-related cancer (breast, ovarian, tubal, or peritoneal cancers).  Mammogram.** / Every year beginning at age 37 and continuing for as long as you are in good health. Consult with your  caregiver.  Pap test.** / Every 3 years starting at age 64 through age 54 or 62 with a 3 consecutive normal Pap tests. Testing can be stopped between 65 and 70 with 3 consecutive normal Pap tests and no abnormal Pap or HPV tests in the past 10 years.  HPV screening.** / Every 3 years from ages 64 through ages 98 or 3 with a history of 3 consecutive normal Pap tests. Testing can be stopped between 65 and 70 with 3 consecutive normal Pap tests and no abnormal Pap or HPV tests in the past 10 years.  Fecal occult blood test (FOBT) of stool. / Every year beginning at age 24 and continuing until age 52. You may not  need to do this test if you get a colonoscopy every 10 years.  Flexible sigmoidoscopy or colonoscopy.** / Every 5 years for a flexible sigmoidoscopy or every 10 years for a colonoscopy beginning at age 46 and continuing until age 44.  Hepatitis C blood test.** / For all people born from [birth year] through 1963 and any individual with known risks for hepatitis C.  Osteoporosis screening.** / A one-time screening for women ages 54 and over and women at risk for fractures or osteoporosis.  Skin self-exam. / Monthly.  Influenza vaccine. / Every year.  Tetanus, diphtheria, and acellular pertussis (Tdap/Td) vaccine.** / 1 dose of Td every 10 years.  Varicella vaccine.** / Consult your caregiver.  Zoster vaccine.** / 1 dose for adults aged 62 years or older.  Pneumococcal 13-valent conjugate (PCV13) vaccine.** / Consult your caregiver.  Pneumococcal polysaccharide (PPSV23) vaccine.** / 1 dose for all adults aged 17 years and older.  Meningococcal vaccine.** / Consult your caregiver.  Hepatitis A vaccine.** / Consult your caregiver.  Hepatitis B vaccine.** / Consult your caregiver.  Haemophilus influenzae type b (Hib) vaccine.** / Consult your caregiver. ** Family history and personal history of risk and conditions may change your caregiver's recommendations. Document Released: 08/03/2001 Document Revised: 10/02/2012 Document Reviewed: 11/02/2010 [facility] Patient Information 2014 [place], [place].

## 2013-05-23 NOTE — Telephone Encounter (Signed)
Phone not accepting calls

## 2013-05-23 NOTE — Addendum Note (Signed)
Addended by: Silvio Pate D on: 05/23/2013 03:43 PM   Modules accepted: Orders

## 2013-05-23 NOTE — Addendum Note (Signed)
Addended by: Silvio Pate D on: 05/23/2013 04:34 PM   Modules accepted: Orders

## 2013-05-23 NOTE — Progress Notes (Signed)
Pre visit review using our clinic review tool, if applicable. No additional management support is needed unless otherwise documented below in the visit note. 

## 2013-05-28 ENCOUNTER — Telehealth: Payer: Self-pay | Admitting: Family Medicine

## 2013-05-28 MED ORDER — CETIRIZINE HCL 10 MG PO TABS: 10.0000 mg | ORAL_TABLET | Freq: Every day | ORAL | Status: DC

## 2013-05-28 MED ORDER — METRONIDAZOLE 500 MG PO TABS: 500.0000 mg | ORAL_TABLET | Freq: Three times a day (TID) | ORAL | Status: DC

## 2013-05-28 NOTE — Telephone Encounter (Signed)
Ok to refill; 3 refills 

## 2013-05-28 NOTE — Telephone Encounter (Signed)
Please advise      KP 

## 2013-05-28 NOTE — Telephone Encounter (Signed)
BV- flagyl 1 po bid for 7 days  Liver function elevated--- may be fatty liver. Any etoh?, Tylenol---- Repeat 2 weeks hep, ggt 790.4  Patient has been made aware and voiced understanding.  Rx sent and the patient needs a lab apt in 2 weeks. Please schedule     KP

## 2013-05-28 NOTE — Telephone Encounter (Signed)
Zyrtec 10 mg  1 po qd prn #30  5 refills

## 2013-05-28 NOTE — Telephone Encounter (Signed)
Please review note      KP

## 2013-05-28 NOTE — Telephone Encounter (Signed)
Patient called and stated that her medicaid will not cover hydoxyzine anymore and wanted to see was there a generic brand that could be called in.

## 2013-05-28 NOTE — Telephone Encounter (Signed)
She is on Brintellix--- decrease to 10 mg

## 2013-05-29 NOTE — Telephone Encounter (Signed)
Unable to scheduled due to [facility]. Will forward to Adero.     KP

## 2013-05-31 ENCOUNTER — Telehealth: Payer: Self-pay | Admitting: *Deleted

## 2013-05-31 NOTE — Telephone Encounter (Signed)
05/31/2013  Pt called and said she was told we need to do blood work to recheck her liver.  Please confirm this and put the orders in.  We need to call and schedule her when orders are in.  Please advise.  bw

## 2013-05-31 NOTE — Telephone Encounter (Signed)
Orders are in. Please schedule .       KP 

## 2013-06-01 ENCOUNTER — Encounter: Payer: Self-pay | Admitting: Dietician

## 2013-06-01 ENCOUNTER — Encounter: Payer: Medicare Other | Attending: Family Medicine | Admitting: Dietician

## 2013-06-01 VITALS — Ht 60.0 in | Wt 232.8 lb

## 2013-06-01 DIAGNOSIS — F70 Mild intellectual disabilities: Secondary | ICD-10-CM | POA: Diagnosis not present

## 2013-06-01 DIAGNOSIS — F329 Major depressive disorder, single episode, unspecified: Secondary | ICD-10-CM | POA: Diagnosis not present

## 2013-06-01 DIAGNOSIS — F3289 Other specified depressive episodes: Secondary | ICD-10-CM | POA: Insufficient documentation

## 2013-06-01 DIAGNOSIS — E669 Obesity, unspecified: Secondary | ICD-10-CM | POA: Diagnosis not present

## 2013-06-01 DIAGNOSIS — Z713 Dietary counseling and surveillance: Secondary | ICD-10-CM | POA: Insufficient documentation

## 2013-06-01 NOTE — Progress Notes (Signed)
Medical Nutrition Therapy:  Appt start time: 1000 end time:  1100.  Assessment:  Primary concerns today: weight loss. Albumin 3.2, ALT 47.   Preferred Learning Style:   Auditory   Learning Readiness:   Contemplating  MEDICATIONS: see list.   DIETARY INTAKE: Usual eating pattern includes 1 meals and 0-2 snacks per day. Everyday foods include juice, large portions with lunch.  Avoided foods include brussels sprouts, corn.    24-hr recall:  B ( AM): none. Water or juice (16+ oz). Recently drinking smoothies (kelp, bananas, oranges, apples, water). Very little consistency to having this or anything for breakfast.  Snk ( AM): none. Apple when there is fruit available. Mostly lots of water.  L ( PM): admits to very large portions at this meal. Often this is only meal of day. Sometimes fried pork chops with green peas, greens, broccoli. No corn or brussels sprouts, with potatoes (recently cut out potatoes and mostt starches generally). Water or juice (multiple glasses). Pt states she usually fries foods and prepares foods with more fat, as well as eats more fatty snack foods when cooking for and eating with her father, who is much taller and fairly active. Snk ( PM): none usually. Occasionally a special K bar.  D ( PM): salad (almonds, reduced fat oil caesar, baked chix, spinach, lettuce) Snk ( PM): water Beverages: water or large amounts of juice (claims to be freshly squeezed) Pt reports recently eliminating many starches, sugary foods (chocolate in large portions during menstruation).   Usual physical activity: pt c/o poor energy level related to depression. She also has heat rxn with hives and potential syncope.   Pt reports she has had the aid of her father in working out previously, who is a former Administrator, [employer]. She states they worked out >4 hours at a stretch. Father expectations for fitness and weight loss may be somewhat unrealistic at this time. 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  Progress Towards Goal(s):  In progress.   Nutritional Diagnosis:  [place]-3.3 Overweight/obesity As related to low physical activity, large kcal, fat intake in one sitting each day.  As evidenced by pt diet recall, report of activity.    Intervention:  Nutrition counseling provided with focus on reducing kcal and fat, spacing out eating episodes to improve weight outcomes, relieve GERD. Additionally, RD recommended significant restriction to juice consumption with emphasis on eating whole fruit.  RD also thoroughly discussed introduction of an appropriate fitness regimen, with recommendations for progression and maintenance.  Teaching Method Utilized:  Visual Auditory  Handouts given during visit include:  Good Protein, Fat, and Carb foods  The Plate Method  Home Workout  [place] Guide  Goals: Home Workout 2 days per week. Aerobic Workout (walk, jog, cycle, swim, jump rope, etc.) 3 days per week. 30 minutes each day. Increase 5 minutes per day every 2 weeks until you reach an hour per day. Then, increase to 4 days per week.  Use the FITT principle to improve fitness over time. 3 meals per day, each with a "Good Protein" food and a "Good Carb" food. Slowly begin to space these meals out more evenly. Fewer fried foods to limit fat in a meal. No more than 8 oz. juice per day. Eat the whole fruit instead and drink water.   Barriers to learning/adherence to lifestyle change:  depression, heat rxn, some family influences and unrealistic expectations  Demonstrated degree of understanding via:  Teach Back   Monitoring/Evaluation:  Dietary intake, exercise, portion control, and body weight in 6 week(s).

## 2013-06-06 NOTE — Telephone Encounter (Signed)
Called patient to schedule labs for next week. States that she needs to check her work schedule and check with her dad as to when she can come. She will call back to schedule labs.  OV notes:  Repeat 2 weeks hep, ggt 790.4

## 2013-06-16 ENCOUNTER — Other Ambulatory Visit: Payer: Self-pay | Admitting: Family Medicine

## 2013-06-28 ENCOUNTER — Other Ambulatory Visit: Payer: Self-pay | Admitting: Family Medicine

## 2013-07-09 ENCOUNTER — Ambulatory Visit: Payer: Managed Care, Other (non HMO) | Admitting: Dietician

## 2013-07-20 ENCOUNTER — Ambulatory Visit: Payer: Managed Care, Other (non HMO) | Admitting: Dietician

## 2013-08-22 ENCOUNTER — Ambulatory Visit (INDEPENDENT_AMBULATORY_CARE_PROVIDER_SITE_OTHER): Payer: Medicare Other | Admitting: Nurse Practitioner

## 2013-08-22 ENCOUNTER — Encounter: Payer: Self-pay | Admitting: Nurse Practitioner

## 2013-08-22 DIAGNOSIS — R110 Nausea: Secondary | ICD-10-CM | POA: Diagnosis not present

## 2013-08-22 DIAGNOSIS — G43009 Migraine without aura, not intractable, without status migrainosus: Secondary | ICD-10-CM

## 2013-08-22 MED ORDER — PROMETHAZINE HCL 25 MG/ML IJ SOLN
25.0000 mg | Freq: Once | INTRAMUSCULAR | Status: AC
  Administered 2013-08-22: 25 mg via INTRAMUSCULAR

## 2013-08-22 MED ORDER — ONDANSETRON 8 MG PO TBDP: 8.0000 mg | ORAL_TABLET | Freq: Three times a day (TID) | ORAL | Status: DC | PRN

## 2013-08-22 NOTE — Progress Notes (Signed)
Pre visit review using our clinic review tool, if applicable. No additional management support is needed unless otherwise documented below in the visit note. 

## 2013-08-22 NOTE — Patient Instructions (Addendum)
Take 600 mg ibuprophen with food when you get home. You may use imitrex again if needed.  Rest. Take topamax tonight. You may use ondansetron for nausea if needed. If no relief, go to urgent care or ER.

## 2013-08-23 ENCOUNTER — Emergency Department (HOSPITAL_COMMUNITY)
Admission: EM | Admit: 2013-08-23 | Discharge: 2013-08-23 | Disposition: A | Discharge status: Home / Self Care | Payer: Managed Care, Other (non HMO) | Attending: Emergency Medicine | Admitting: Emergency Medicine

## 2013-08-23 ENCOUNTER — Encounter (HOSPITAL_COMMUNITY): Payer: Self-pay | Admitting: Emergency Medicine

## 2013-08-23 DIAGNOSIS — Z87891 Personal history of nicotine dependence: Secondary | ICD-10-CM | POA: Diagnosis not present

## 2013-08-23 DIAGNOSIS — F411 Generalized anxiety disorder: Secondary | ICD-10-CM | POA: Diagnosis not present

## 2013-08-23 DIAGNOSIS — Z79899 Other long term (current) drug therapy: Secondary | ICD-10-CM | POA: Insufficient documentation

## 2013-08-23 DIAGNOSIS — F329 Major depressive disorder, single episode, unspecified: Secondary | ICD-10-CM | POA: Diagnosis not present

## 2013-08-23 DIAGNOSIS — Z8679 Personal history of other diseases of the circulatory system: Secondary | ICD-10-CM | POA: Insufficient documentation

## 2013-08-23 DIAGNOSIS — G43909 Migraine, unspecified, not intractable, without status migrainosus: Secondary | ICD-10-CM | POA: Insufficient documentation

## 2013-08-23 DIAGNOSIS — Z880 Allergy status to penicillin: Secondary | ICD-10-CM | POA: Diagnosis not present

## 2013-08-23 DIAGNOSIS — F3289 Other specified depressive episodes: Secondary | ICD-10-CM | POA: Insufficient documentation

## 2013-08-23 DIAGNOSIS — G40909 Epilepsy, unspecified, not intractable, without status epilepticus: Secondary | ICD-10-CM | POA: Insufficient documentation

## 2013-08-23 DIAGNOSIS — Q859 Phakomatosis, unspecified: Secondary | ICD-10-CM | POA: Diagnosis not present

## 2013-08-23 DIAGNOSIS — R110 Nausea: Secondary | ICD-10-CM | POA: Insufficient documentation

## 2013-08-23 DIAGNOSIS — K219 Gastro-esophageal reflux disease without esophagitis: Secondary | ICD-10-CM | POA: Diagnosis not present

## 2013-08-23 HISTORY — DX: Migraine, unspecified, not intractable, without status migrainosus: G43.909

## 2013-08-23 MED ORDER — METOCLOPRAMIDE HCL 5 MG/ML IJ SOLN
10.0000 mg | Freq: Once | INTRAMUSCULAR | Status: AC
  Administered 2013-08-23: 10 mg via INTRAVENOUS
  Filled 2013-08-23: qty 2

## 2013-08-23 MED ORDER — LORAZEPAM 2 MG/ML IJ SOLN
1.0000 mg | Freq: Once | INTRAMUSCULAR | Status: AC
  Administered 2013-08-23: 1 mg via INTRAVENOUS
  Filled 2013-08-23: qty 1

## 2013-08-23 MED ORDER — DEXAMETHASONE SODIUM PHOSPHATE 10 MG/ML IJ SOLN
10.0000 mg | Freq: Once | INTRAMUSCULAR | Status: AC
  Administered 2013-08-23: 10 mg via INTRAVENOUS
  Filled 2013-08-23: qty 1

## 2013-08-23 MED ORDER — SODIUM CHLORIDE 0.9 % IV SOLN
1000.0000 mL | Freq: Once | INTRAVENOUS | Status: AC
  Administered 2013-08-23: 1000 mL via INTRAVENOUS

## 2013-08-23 MED ORDER — SODIUM CHLORIDE 0.9 % IV SOLN: 1000.0000 mL | INTRAVENOUS | Status: DC

## 2013-08-23 NOTE — ED Notes (Signed)
Per pt, states she has has a migraine for 4 days-saw PCP but they didn't have medicine for her-was not given script

## 2013-08-23 NOTE — ED Provider Notes (Signed)
CSN: [id number]     Arrival date & time 08/23/13  0903 History   First MD Initiated Contact with Patient 08/23/13 [phone]     Chief Complaint  Patient presents with  . Migraine     (Consider location/radiation/quality/duration/timing/severity/associated sxs/prior Treatment) HPI Patient reports she has a history of migraine headaches. She states she started getting her typical migraine headache 4 days ago. Her headache is located frontally and she states it throbs whenever she moves or lifts up her head. She had vomiting once yesterday and today however she still has some nausea. She denies blurred double vision. She denies any numbness or weakness in her extremities. She states bright lights and loud noises make her headache feel worse. She states she took Imitrex orally twice yesterday without relief. She took ibuprofen 600 mg today without relief.   PCP Dr Etter Sjogren  Past Medical History  Diagnosis Date  . Seizures   . Reflux   . Depression   . Anxiety   . GERD (gastroesophageal reflux disease)   . Hives   . Sturge-Weber syndrome   . Migraine    Past Surgical History  Procedure Laterality Date  . Cholecystectomy     Family History  Problem Relation Age of Onset  . Hypertension Father   . Hyperlipidemia Father    History  Substance Use Topics  . Smoking status: Former Smoker -- 0.10 packs/day for 2 years    Types: Cigarettes    Quit date: 03/09/2013  . Smokeless tobacco: Never Used  . Alcohol Use: Yes     Comment: Socially  no recent alcohol employed   OB History   Grav Para Term Preterm Abortions TAB SAB Ect Mult Living                 Review of Systems  All other systems reviewed and are negative.      Allergies  Lidocaine-epinephrine; Mepivacaine hcl; Diphenhydramine hcl; and Penicillins  Home Medications   Current Outpatient Rx  Name  Route  Sig  Dispense  Refill  . Aspirin-Acetaminophen-Caffeine (PAMPRIN MAX PO)   Oral   Take 1 tablet by mouth every  6 (six) hours as needed.         Marland Kitchen buPROPion (WELLBUTRIN XL) 150 MG 24 hr tablet   Oral   Take 150 mg by mouth daily.         . cetirizine (ZYRTEC) 10 MG tablet   Oral   Take 1 tablet (10 mg total) by mouth daily.   30 tablet   5   . drospirenone-ethinyl estradiol (ZARAH) 3-0.03 MG tablet   Oral   Take 1 tablet by mouth daily.         Marland Kitchen ibuprofen (ADVIL,MOTRIN) 200 MG tablet   Oral   Take 600 mg by mouth every 6 (six) hours as needed for fever or moderate pain.         . Multiple Vitamin (MULTIVITAMIN WITH MINERALS) TABS tablet   Oral   Take 1 tablet by mouth daily.         . ondansetron (ZOFRAN-ODT) 8 MG disintegrating tablet   Oral   Take 1 tablet (8 mg total) by mouth every 8 (eight) hours as needed for nausea or vomiting.   20 tablet   0   . pantoprazole (PROTONIX) 40 MG tablet   Oral   Take 40 mg by mouth daily.         . SUMAtriptan (IMITREX) 100 MG tablet  1/2-1 tab x1 dose.  Repeat in 2 hrs if needed.  No more than 2 tabs in 24 hrs.   10 tablet   3   . topiramate (TOPAMAX) 50 MG tablet   Oral   Take 100 mg by mouth at bedtime.         . Vortioxetine HBr (BRINTELLIX) 20 MG TABS   Oral   Take 1 tablet by mouth daily.   30 tablet   5   . aspirin-acetaminophen-caffeine (EXCEDRIN MIGRAINE) 989-211-94 MG per tablet   Oral   Take 1 tablet by mouth as needed for headache.           BP 127/81  Pulse 94  Temp(Src) 98.7 F (37.1 C) (Oral)  Resp 17  SpO2 97%  LMP 08/23/2013  Vital signs normal   Physical Exam  Nursing note and vitals reviewed. Constitutional: She is oriented to person, place, and time. She appears well-developed and well-nourished.  Non-toxic appearance. She does not appear ill. She appears distressed.  Appears uncomfortable  HENT:  Head: Normocephalic and atraumatic.  Right Ear: External ear normal.  Left Ear: External ear normal.  Nose: Nose normal. No mucosal edema or rhinorrhea.  Mouth/Throat: Oropharynx is  clear and moist and mucous membranes are normal. No dental abscesses or uvula swelling.  Eyes: Conjunctivae and EOM are normal. Pupils are equal, round, and reactive to light.  Neck: Normal range of motion and full passive range of motion without pain. Neck supple.  Cardiovascular: Normal rate, regular rhythm and normal heart sounds.  Exam reveals no gallop and no friction rub.   No murmur heard. Pulmonary/Chest: Effort normal and breath sounds normal. No respiratory distress. She has no wheezes. She has no rhonchi. She has no rales. She exhibits no tenderness and no crepitus.  Abdominal: Soft. Normal appearance and bowel sounds are normal. She exhibits no distension. There is no tenderness. There is no rebound and no guarding.  Musculoskeletal: Normal range of motion. She exhibits no edema and no tenderness.  Moves all extremities well.   Neurological: She is alert and oriented to person, place, and time. She has normal strength. No cranial nerve deficit.  Skin: Skin is warm, dry and intact. No rash noted. No erythema. No pallor.  Psychiatric: She has a normal mood and affect. Her speech is normal and behavior is normal. Her mood appears not anxious.    ED Course  Procedures (including critical care time)  Medications  0.9 %  sodium chloride infusion (1,000 mLs Intravenous New Bag/Given 08/23/13 1018)    Followed by  0.9 %  sodium chloride infusion (not administered)  metoCLOPramide (REGLAN) injection 10 mg (10 mg Intravenous Given 08/23/13 1019)  dexamethasone (DECADRON) injection 10 mg (10 mg Intravenous Given 08/23/13 1019)  LORazepam (ATIVAN) injection 1 mg (1 mg Intravenous Given 08/23/13 1019)    Recheck, pt states her headache is almost gone, ready to be discharged  Labs Review Labs Reviewed - No data to display Imaging Review No results found.   EKG Interpretation None      MDM  patient has history of migraine headaches and presents with him today. She improved with migraine  cocktail.    Final diagnoses:  Migraine headache    Plan discharge   Rolland Porter, MD, Alanson Aly, MD 08/23/13 [phone]

## 2013-08-23 NOTE — Discharge Instructions (Signed)
Drink plenty of fluids. Recheck as needed.    Migraine Headache A migraine headache is an intense, throbbing pain on one or both sides of your head. A migraine can last for 30 minutes to several hours. CAUSES  The exact cause of a migraine headache is not always known. However, a migraine may be caused when nerves in the brain become irritated and release chemicals that cause inflammation. This causes pain. Certain things may also trigger migraines, such as:  Alcohol.  Smoking.  Stress.  Menstruation.  Aged cheeses.  Foods or drinks that contain nitrates, glutamate, aspartame, or tyramine.  Lack of sleep.  Chocolate.  Caffeine.  Hunger.  Physical exertion.  Fatigue.  Medicines used to treat chest pain (nitroglycerine), birth control pills, estrogen, and some blood pressure medicines. SIGNS AND SYMPTOMS  Pain on one or both sides of your head.  Pulsating or throbbing pain.  Severe pain that prevents daily activities.  Pain that is aggravated by any physical activity.  Nausea, vomiting, or both.  Dizziness.  Pain with exposure to bright lights, loud noises, or activity.  General sensitivity to bright lights, loud noises, or smells. Before you get a migraine, you may get warning signs that a migraine is coming (aura). An aura may include:  Seeing flashing lights.  Seeing bright spots, halos, or zig-zag lines.  Having tunnel vision or blurred vision.  Having feelings of numbness or tingling.  Having trouble talking.  Having muscle weakness. DIAGNOSIS  A migraine headache is often diagnosed based on:  Symptoms.  Physical exam.  A CT scan or MRI of your head. These imaging tests cannot diagnose migraines, but they can help rule out other causes of headaches. TREATMENT Medicines may be given for pain and nausea. Medicines can also be given to help prevent recurrent migraines.  HOME CARE INSTRUCTIONS  Only take over-the-counter or prescription  medicines for pain or discomfort as directed by your health care provider. The use of long-term narcotics is not recommended.  Lie down in a dark, quiet room when you have a migraine.  Keep a journal to find out what may trigger your migraine headaches. For example, write down:  What you eat and drink.  How much sleep you get.  Any change to your diet or medicines.  Limit alcohol consumption.  Quit smoking if you smoke.  Get 7 9 hours of sleep, or as recommended by your health care provider.  Limit stress.  Keep lights dim if bright lights bother you and make your migraines worse. SEEK IMMEDIATE MEDICAL CARE IF:   Your migraine becomes severe.  You have a fever.  You have a stiff neck.  You have vision loss.  You have muscular weakness or loss of muscle control.  You start losing your balance or have trouble walking.  You feel faint or pass out.  You have severe symptoms that are different from your first symptoms. MAKE SURE YOU:   Understand these instructions.  Will watch your condition.  Will get help right away if you are not doing well or get worse. Document Released: 06/07/2005 Document Revised: 03/28/2013 Document Reviewed: 02/12/2013 [facility] Patient Information 2014 [place].

## 2013-08-24 NOTE — Progress Notes (Signed)
   Subjective:    Patient ID: Gina Cameron, female    DOB: [birth year], 28 y.o.   MRN: [id number]  Migraine  This is a recurrent problem. The current episode started yesterday. The problem occurs constantly. The problem has been waxing and waning. The pain is located in the frontal region. The pain does not radiate. The pain quality is similar to prior headaches. The quality of the pain is described as aching. The pain is moderate. Associated symptoms include anorexia, nausea and photophobia. Pertinent negatives include no abdominal pain, abnormal behavior, blurred vision, coughing, dizziness, ear pain, eye pain, eye redness, eye watering, fever, numbness, seizures, sore throat, vomiting or weakness. Nothing aggravates the symptoms. She has tried acetaminophen and darkened room (took tylenol yesterday w/complete relief. woke this am w/HA. Took 1 T imitrex. no relief. ) for the symptoms. The treatment provided no relief. Her past medical history is significant for migraine headaches. (Sturge-weber syndrome)      Review of Systems  Constitutional: Positive for appetite change. Negative for fever, chills, activity change and fatigue.  HENT: Negative for congestion, ear pain and sore throat.   Eyes: Positive for photophobia. Negative for blurred vision, pain, redness and visual disturbance.  Respiratory: Negative for cough.   Gastrointestinal: Positive for nausea and anorexia. Negative for vomiting and abdominal pain.  Neurological: Positive for headaches. Negative for dizziness, seizures, speech difficulty, weakness, light-headedness and numbness.  Psychiatric/Behavioral: Negative for confusion.       Objective:   Physical Exam  Vitals reviewed. Constitutional: She is oriented to person, place, and time. She appears well-developed and well-nourished. She appears distressed.  Laying on exam table with coat over head. Doesn't want to sit up for exam or open eyes. Eventually does with mother's  encouragement.  HENT:  Head: Normocephalic and atraumatic.  Eyes: Conjunctivae are normal. Pupils are equal, round, and reactive to light. Right eye exhibits no discharge. Left eye exhibits no discharge.  Cardiovascular: Normal rate.   Pulmonary/Chest: Effort normal.  Neurological: She is alert and oriented to person, place, and time. No cranial nerve deficit. Coordination normal.  Skin: Skin is warm and dry.  Psychiatric:  28 yo-still reliant on mother to prompt her through exam & ask & answer questions.          Assessment & Plan:  1. Migraine with nausea Nausea may be due to drinking orange juice on empty stomach this am. - promethazine (PHENERGAN) injection 25 mg; Inject 1 mL (25 mg total) into the muscle once in ofc. Unable to give toradol as all supplies in ofc were recalled. Adv go home take another imitrex and/or 600 mg ibuprophen with food. Rest. Hydrate. Take topamax tonight. Go to ER if no improvement. May use zofran in future for nausea r/t MHA. - ondansetron (ZOFRAN-ODT) 8 MG disintegrating tablet; Take 1 tablet (8 mg total) by mouth every 8 (eight) hours as needed for nausea or vomiting.  Dispense: 20 tablet; Refill: 0

## 2013-09-12 ENCOUNTER — Ambulatory Visit (INDEPENDENT_AMBULATORY_CARE_PROVIDER_SITE_OTHER): Payer: Managed Care, Other (non HMO) | Admitting: Nurse Practitioner

## 2013-09-12 VITALS — BP 118/81 | HR 76 | Temp 98.0°F | Ht 60.0 in | Wt 237.0 lb

## 2013-09-12 DIAGNOSIS — J309 Allergic rhinitis, unspecified: Secondary | ICD-10-CM | POA: Diagnosis not present

## 2013-09-12 DIAGNOSIS — J302 Other seasonal allergic rhinitis: Secondary | ICD-10-CM

## 2013-09-12 MED ORDER — AZELASTINE HCL 0.1 % NA SOLN: 1.0000 | Freq: Two times a day (BID) | NASAL | Status: DC

## 2013-09-12 NOTE — Progress Notes (Signed)
   Subjective:    Patient ID: [name], female    DOB: 30-Mar-1986, 28 y.o.   MRN: [id number]  Sinusitis This is a recurrent problem. The current episode started 1 to 4 weeks ago (1 wk). The problem is unchanged. There has been no fever. The pain is mild. Associated symptoms include congestion, coughing, headaches and sinus pressure. Pertinent negatives include no chills, ear pain, hoarse voice, neck pain, shortness of breath, sneezing, sore throat or swollen glands. (Itchy ears, watery eyes.) Past treatments include nothing.      Review of Systems  Constitutional: Negative for fever, chills, activity change, appetite change and fatigue.  HENT: Positive for congestion, rhinorrhea and sinus pressure. Negative for ear pain, hoarse voice, sneezing and sore throat.   Eyes: Positive for discharge (watery, bilat).  Respiratory: Positive for cough. Negative for chest tightness, shortness of breath and wheezing.   Gastrointestinal: Positive for nausea. Negative for vomiting, abdominal pain and diarrhea.  Musculoskeletal: Negative for neck pain.  Allergic/Immunologic: Positive for environmental allergies.  Neurological: Positive for headaches. Negative for dizziness and light-headedness.  Hematological: Negative for adenopathy.       Objective:   Physical Exam  Vitals reviewed. Constitutional: She is oriented to person, place, and time. She appears well-developed and well-nourished. No distress.  HENT:  Head: Normocephalic and atraumatic.  Right Ear: External ear normal.  Left Ear: External ear normal.  Mouth/Throat: Oropharynx is clear and moist. No oropharyngeal exudate.  Pale nasal mucosa. Scant purulent drainage.  Eyes: Conjunctivae are normal. Right eye exhibits no discharge. Left eye exhibits no discharge.  Neck: Normal range of motion. Neck supple. No thyromegaly present.  Difficult exam due to body habitus.  Cardiovascular: Normal rate and normal heart sounds.   No murmur  heard. Pulmonary/Chest: Effort normal and breath sounds normal. No respiratory distress. She has no wheezes. She has no rales.  Occasional cough during exam. Sounds tight. Lung sounds clear.   Lymphadenopathy:    She has no cervical adenopathy.  Neurological: She is alert and oriented to person, place, and time.  Skin: Skin is warm and dry.  Psychiatric: She has a normal mood and affect. Her behavior is normal. Thought content normal.          Assessment & Plan:  1. Seasonal allergies - azelastine (ASTELIN) 137 MCG/SPRAY nasal spray; Place 1 spray into both nostrils 2 (two) times daily. Use in each nostril as directed  Dispense: 30 mL; Refill: 5 Instructed on proper use of nasal sprays. Daily sinus rinses. Pseudoephedrine for 4-6 days. See pt instructions.

## 2013-09-12 NOTE — Patient Instructions (Signed)
Start nasal antihistamine twice daily. If you develop nose bleed, cut back to once daily. Use sinus rinse (Neilmed sinus rinse) daily (before you use nasal antihistamine). Use pseudoephedrine 30 mg twice daily for 4-5 days.   Allergic Rhinitis Allergic rhinitis is when the mucous membranes in the nose respond to allergens. Allergens are particles in the air that cause your body to have an allergic reaction. This causes you to release allergic antibodies. Through a chain of events, these eventually cause you to release histamine into the blood stream. Although meant to protect the body, it is this release of histamine that causes your discomfort, such as frequent sneezing, congestion, and an itchy, runny nose.  CAUSES  Seasonal allergic rhinitis (hay fever) is caused by pollen allergens that may come from grasses, trees, and weeds. Year-round allergic rhinitis (perennial allergic rhinitis) is caused by allergens such as house dust mites, pet dander, and mold spores.  SYMPTOMS   Nasal stuffiness (congestion).  Itchy, runny nose with sneezing and tearing of the eyes. DIAGNOSIS  Your health care provider can help you determine the allergen or allergens that trigger your symptoms. If you and your health care provider are unable to determine the allergen, skin or blood testing may be used. TREATMENT  Allergic Rhinitis does not have a cure, but it can be controlled by:  Medicines and allergy shots (immunotherapy).  Avoiding the allergen. Hay fever may often be treated with antihistamines in pill or nasal spray forms. Antihistamines block the effects of histamine. There are over-the-counter medicines that may help with nasal congestion and swelling around the eyes. Check with your health care provider before taking or giving this medicine.  If avoiding the allergen or the medicine prescribed do not work, there are many new medicines your health care provider can prescribe. Stronger medicine may be used  if initial measures are ineffective. Desensitizing injections can be used if medicine and avoidance does not work. Desensitization is when a patient is given ongoing shots until the body becomes less sensitive to the allergen. Make sure you follow up with your health care provider if problems continue. HOME CARE INSTRUCTIONS It is not possible to completely avoid allergens, but you can reduce your symptoms by taking steps to limit your exposure to them. It helps to know exactly what you are allergic to so that you can avoid your specific triggers. SEEK MEDICAL CARE IF:   You have a fever.  You develop a cough that does not stop easily (persistent).  You have shortness of breath.  You start wheezing.  Symptoms interfere with normal daily activities. Document Released: 03/02/2001 Document Revised: 03/28/2013 Document Reviewed: 02/12/2013 [facility] Patient Information 2014 [place].

## 2013-09-12 NOTE — Progress Notes (Signed)
Pre visit review using our clinic review tool, if applicable. No additional management support is needed unless otherwise documented below in the visit note. 

## 2013-10-26 ENCOUNTER — Ambulatory Visit: Payer: Managed Care, Other (non HMO)

## 2013-12-31 ENCOUNTER — Other Ambulatory Visit: Payer: Self-pay | Admitting: Family Medicine

## 2014-02-02 ENCOUNTER — Other Ambulatory Visit: Payer: Self-pay | Admitting: Family Medicine

## 2014-03-16 ENCOUNTER — Other Ambulatory Visit: Payer: Self-pay | Admitting: Family Medicine

## 2014-04-30 ENCOUNTER — Other Ambulatory Visit: Payer: Self-pay | Admitting: Family Medicine

## 2014-05-02 ENCOUNTER — Other Ambulatory Visit: Payer: Self-pay | Admitting: Family Medicine

## 2014-05-20 ENCOUNTER — Ambulatory Visit (INDEPENDENT_AMBULATORY_CARE_PROVIDER_SITE_OTHER): Payer: Medicare Other | Admitting: Family Medicine

## 2014-05-20 ENCOUNTER — Encounter: Payer: Self-pay | Admitting: Family Medicine

## 2014-05-20 VITALS — BP 120/80 | HR 94 | Temp 98.3°F | Wt 230.6 lb

## 2014-05-20 DIAGNOSIS — J0110 Acute frontal sinusitis, unspecified: Secondary | ICD-10-CM

## 2014-05-20 DIAGNOSIS — F329 Major depressive disorder, single episode, unspecified: Secondary | ICD-10-CM

## 2014-05-20 DIAGNOSIS — L509 Urticaria, unspecified: Secondary | ICD-10-CM

## 2014-05-20 DIAGNOSIS — R05 Cough: Secondary | ICD-10-CM | POA: Diagnosis not present

## 2014-05-20 DIAGNOSIS — F32A Depression, unspecified: Secondary | ICD-10-CM

## 2014-05-20 DIAGNOSIS — R059 Cough, unspecified: Secondary | ICD-10-CM

## 2014-05-20 MED ORDER — FLUCONAZOLE 150 MG PO TABS: ORAL_TABLET | ORAL | Status: DC

## 2014-05-20 MED ORDER — VORTIOXETINE HBR 20 MG PO TABS: 20.0000 mg | ORAL_TABLET | Freq: Every day | ORAL | Status: DC

## 2014-05-20 MED ORDER — LEVOCETIRIZINE DIHYDROCHLORIDE 5 MG PO TABS: 5.0000 mg | ORAL_TABLET | Freq: Every evening | ORAL | Status: DC

## 2014-05-20 MED ORDER — CLARITHROMYCIN ER 500 MG PO TB24: ORAL_TABLET | ORAL | Status: DC

## 2014-05-20 MED ORDER — PANTOPRAZOLE SODIUM 40 MG PO TBEC: 40.0000 mg | DELAYED_RELEASE_TABLET | Freq: Every day | ORAL | Status: DC

## 2014-05-20 MED ORDER — HYDROCODONE-HOMATROPINE 5-1.5 MG/5ML PO SYRP: ORAL_SOLUTION | ORAL | Status: DC

## 2014-05-20 MED ORDER — RANITIDINE HCL 150 MG PO TABS: 150.0000 mg | ORAL_TABLET | Freq: Two times a day (BID) | ORAL | Status: DC

## 2014-05-20 MED ORDER — SUMATRIPTAN SUCCINATE 100 MG PO TABS: ORAL_TABLET | ORAL | Status: DC

## 2014-05-20 NOTE — Progress Notes (Signed)
  Subjective:     Gina Cameron is a 28 y.o. female who presents for evaluation of sinus pain. Symptoms include: congestion, cough, facial pain, foul rhinorrhea, headaches, nasal congestion, post nasal drip, purulent rhinorrhea, sinus pressure, sneezing and sore throat. Onset of symptoms was 10 days ago. Symptoms have been gradually worsening since that time. Past history is significant for occasional episodes of bronchitis. Patient is a former smoker, quit 6 months ago.  The following portions of the patient's history were reviewed and updated as appropriate: allergies, current medications, past family history, past medical history, past social history, past surgical history and problem list.  Review of Systems Pertinent items are noted in HPI.   Objective:    BP 120/80 mmHg  Pulse 94  Temp(Src) 98.3 F (36.8 C) (Oral)  Wt 230 lb 9.6 oz (104.599 kg)  SpO2 98%  LMP 05/13/2014 General appearance: alert, cooperative, appears stated age and mild distress Ears: normal TM's and external ear canals both ears Nose: green discharge, moderate congestion, sinus tenderness bilateral Throat: abnormal findings: mild oropharyngeal erythema and pnd Neck: moderate anterior cervical adenopathy, supple, symmetrical, trachea midline and thyroid not enlarged, symmetric, no tenderness/mass/nodules Lungs: clear to auscultation bilaterally Heart: S1, S2 normal    Assessment:    Acute bacterial sinusitis.    Plan:    Nasal steroids per medication orders. Antihistamines per medication orders. Biaxin per medication orders.    1. Depression Refill meds, stable - Vortioxetine HBr (BRINTELLIX) 20 MG TABS; Take 20 mg by mouth daily.  Dispense: 30 tablet; Refill: 5  2. Hives Ins will not pay for vistaril anymore - levocetirizine (XYZAL) 5 MG tablet; Take 1 tablet (5 mg total) by mouth every evening.  Dispense: 30 tablet; Refill: 5 - ranitidine (ZANTAC) 150 MG tablet; Take 1 tablet (150 mg total) by  mouth 2 (two) times daily.  Dispense: 60 tablet; Refill: 5  3. Cough   - HYDROcodone-homatropine (HYCODAN) 5-1.5 MG/5ML syrup; 1 tsp po qhs prn cough  Dispense: 120 mL; Refill: 0  4. Acute frontal sinusitis, recurrence not specified con't flonase , xyzal  - clarithromycin (BIAXIN XL) 500 MG 24 hr tablet; 1 po bid  Dispense: 28 tablet; Refill: 0

## 2014-05-20 NOTE — Progress Notes (Signed)
Pre visit review using our clinic review tool, if applicable. No additional management support is needed unless otherwise documented below in the visit note. 

## 2014-05-20 NOTE — Patient Instructions (Signed)

## 2014-05-23 ENCOUNTER — Other Ambulatory Visit: Payer: Self-pay | Admitting: Family Medicine

## 2014-06-29 DIAGNOSIS — Q858 Other phakomatoses, not elsewhere classified: Secondary | ICD-10-CM | POA: Diagnosis not present

## 2014-06-29 DIAGNOSIS — R51 Headache: Secondary | ICD-10-CM | POA: Diagnosis not present

## 2014-07-04 ENCOUNTER — Other Ambulatory Visit: Payer: Self-pay | Admitting: Family Medicine

## 2014-07-22 DIAGNOSIS — R000 Tachycardia, unspecified: Secondary | ICD-10-CM | POA: Diagnosis not present

## 2014-07-22 DIAGNOSIS — Z888 Allergy status to other drugs, medicaments and biological substances status: Secondary | ICD-10-CM | POA: Diagnosis not present

## 2014-07-22 DIAGNOSIS — J069 Acute upper respiratory infection, unspecified: Secondary | ICD-10-CM | POA: Diagnosis not present

## 2014-07-22 DIAGNOSIS — Z880 Allergy status to penicillin: Secondary | ICD-10-CM | POA: Diagnosis not present

## 2014-07-22 DIAGNOSIS — R05 Cough: Secondary | ICD-10-CM | POA: Diagnosis not present

## 2014-07-23 DIAGNOSIS — R05 Cough: Secondary | ICD-10-CM | POA: Diagnosis not present

## 2014-07-27 DIAGNOSIS — F329 Major depressive disorder, single episode, unspecified: Secondary | ICD-10-CM | POA: Diagnosis not present

## 2014-07-27 DIAGNOSIS — F419 Anxiety disorder, unspecified: Secondary | ICD-10-CM | POA: Diagnosis not present

## 2014-07-27 DIAGNOSIS — R918 Other nonspecific abnormal finding of lung field: Secondary | ICD-10-CM | POA: Diagnosis not present

## 2014-07-27 DIAGNOSIS — J189 Pneumonia, unspecified organism: Secondary | ICD-10-CM | POA: Diagnosis not present

## 2014-07-27 DIAGNOSIS — R0602 Shortness of breath: Secondary | ICD-10-CM | POA: Diagnosis not present

## 2014-07-27 DIAGNOSIS — J159 Unspecified bacterial pneumonia: Secondary | ICD-10-CM | POA: Diagnosis not present

## 2014-07-27 DIAGNOSIS — Z6841 Body Mass Index (BMI) 40.0 and over, adult: Secondary | ICD-10-CM | POA: Diagnosis not present

## 2014-07-27 DIAGNOSIS — Z880 Allergy status to penicillin: Secondary | ICD-10-CM | POA: Diagnosis not present

## 2014-07-27 DIAGNOSIS — R05 Cough: Secondary | ICD-10-CM | POA: Diagnosis not present

## 2014-07-27 DIAGNOSIS — Z888 Allergy status to other drugs, medicaments and biological substances status: Secondary | ICD-10-CM | POA: Diagnosis not present

## 2014-07-27 DIAGNOSIS — K219 Gastro-esophageal reflux disease without esophagitis: Secondary | ICD-10-CM | POA: Diagnosis not present

## 2014-07-27 DIAGNOSIS — G43909 Migraine, unspecified, not intractable, without status migrainosus: Secondary | ICD-10-CM | POA: Diagnosis not present

## 2014-07-28 DIAGNOSIS — F329 Major depressive disorder, single episode, unspecified: Secondary | ICD-10-CM | POA: Diagnosis not present

## 2014-07-28 DIAGNOSIS — J189 Pneumonia, unspecified organism: Secondary | ICD-10-CM | POA: Diagnosis not present

## 2014-07-28 DIAGNOSIS — G43909 Migraine, unspecified, not intractable, without status migrainosus: Secondary | ICD-10-CM | POA: Diagnosis not present

## 2014-08-06 DIAGNOSIS — Z8669 Personal history of other diseases of the nervous system and sense organs: Secondary | ICD-10-CM | POA: Insufficient documentation

## 2014-08-06 DIAGNOSIS — Z87898 Personal history of other specified conditions: Secondary | ICD-10-CM | POA: Insufficient documentation

## 2014-08-11 DIAGNOSIS — F419 Anxiety disorder, unspecified: Secondary | ICD-10-CM | POA: Diagnosis not present

## 2014-08-11 DIAGNOSIS — K219 Gastro-esophageal reflux disease without esophagitis: Secondary | ICD-10-CM | POA: Diagnosis not present

## 2014-08-11 DIAGNOSIS — R05 Cough: Secondary | ICD-10-CM | POA: Diagnosis not present

## 2014-08-11 DIAGNOSIS — J069 Acute upper respiratory infection, unspecified: Secondary | ICD-10-CM | POA: Diagnosis not present

## 2014-08-11 DIAGNOSIS — F329 Major depressive disorder, single episode, unspecified: Secondary | ICD-10-CM | POA: Diagnosis not present

## 2014-08-11 DIAGNOSIS — E669 Obesity, unspecified: Secondary | ICD-10-CM | POA: Diagnosis not present

## 2014-08-11 DIAGNOSIS — Z79899 Other long term (current) drug therapy: Secondary | ICD-10-CM | POA: Diagnosis not present

## 2014-08-11 DIAGNOSIS — Z87891 Personal history of nicotine dependence: Secondary | ICD-10-CM | POA: Diagnosis not present

## 2014-08-11 DIAGNOSIS — J00 Acute nasopharyngitis [common cold]: Secondary | ICD-10-CM | POA: Diagnosis not present

## 2014-08-11 DIAGNOSIS — Z6839 Body mass index (BMI) 39.0-39.9, adult: Secondary | ICD-10-CM | POA: Diagnosis not present

## 2014-08-11 DIAGNOSIS — R112 Nausea with vomiting, unspecified: Secondary | ICD-10-CM | POA: Diagnosis not present

## 2014-08-15 ENCOUNTER — Other Ambulatory Visit: Payer: Self-pay | Admitting: Family Medicine

## 2014-08-15 NOTE — Telephone Encounter (Signed)
Rx faxed.    KP 

## 2014-08-20 ENCOUNTER — Ambulatory Visit (HOSPITAL_BASED_OUTPATIENT_CLINIC_OR_DEPARTMENT_OTHER)
Admission: RE | Admit: 2014-08-20 | Discharge: 2014-08-20 | Disposition: A | Discharge status: Home / Self Care | Payer: Medicare Other | Source: Ambulatory Visit | Attending: Family Medicine | Admitting: Family Medicine

## 2014-08-20 ENCOUNTER — Encounter: Payer: Self-pay | Admitting: Family Medicine

## 2014-08-20 ENCOUNTER — Ambulatory Visit (INDEPENDENT_AMBULATORY_CARE_PROVIDER_SITE_OTHER): Payer: Medicare Other | Admitting: Family Medicine

## 2014-08-20 VITALS — BP 123/83 | HR 96 | Temp 98.4°F | Wt 222.4 lb

## 2014-08-20 DIAGNOSIS — L509 Urticaria, unspecified: Secondary | ICD-10-CM | POA: Diagnosis not present

## 2014-08-20 DIAGNOSIS — J4521 Mild intermittent asthma with (acute) exacerbation: Secondary | ICD-10-CM | POA: Diagnosis not present

## 2014-08-20 DIAGNOSIS — N3941 Urge incontinence: Secondary | ICD-10-CM

## 2014-08-20 DIAGNOSIS — Z8701 Personal history of pneumonia (recurrent): Secondary | ICD-10-CM

## 2014-08-20 DIAGNOSIS — R05 Cough: Secondary | ICD-10-CM

## 2014-08-20 DIAGNOSIS — J189 Pneumonia, unspecified organism: Secondary | ICD-10-CM | POA: Diagnosis not present

## 2014-08-20 DIAGNOSIS — J40 Bronchitis, not specified as acute or chronic: Secondary | ICD-10-CM | POA: Diagnosis not present

## 2014-08-20 DIAGNOSIS — R059 Cough, unspecified: Secondary | ICD-10-CM

## 2014-08-20 MED ORDER — PREDNISONE 10 MG PO TABS: ORAL_TABLET | ORAL | Status: DC

## 2014-08-20 MED ORDER — ALBUTEROL SULFATE HFA 108 (90 BASE) MCG/ACT IN AERS: 2.0000 | INHALATION_SPRAY | Freq: Four times a day (QID) | RESPIRATORY_TRACT | Status: DC | PRN

## 2014-08-20 NOTE — Patient Instructions (Signed)
Hives Hives are itchy, red, swollen areas of the skin. They can vary in size and location on your body. Hives can come and go for hours or several days (acute hives) or for several weeks (chronic hives). Hives do not spread from person to person (noncontagious). They may get worse with scratching, exercise, and emotional stress. CAUSES   Allergic reaction to food, additives, or drugs.  Infections, including the common cold.  Illness, such as vasculitis, lupus, or thyroid disease.  Exposure to sunlight, heat, or cold.  Exercise.  Stress.  Contact with chemicals. SYMPTOMS   Red or white swollen patches on the skin. The patches may change size, shape, and location quickly and repeatedly.  Itching.  Swelling of the hands, feet, and face. This may occur if hives develop deeper in the skin. DIAGNOSIS  Your caregiver can usually tell what is wrong by performing a physical exam. Skin or blood tests may also be done to determine the cause of your hives. In some cases, the cause cannot be determined. TREATMENT  Mild cases usually get better with medicines such as antihistamines. Severe cases may require an emergency epinephrine injection. If the cause of your hives is known, treatment includes avoiding that trigger.  HOME CARE INSTRUCTIONS   Avoid causes that trigger your hives.  Take antihistamines as directed by your caregiver to reduce the severity of your hives. Non-sedating or low-sedating antihistamines are usually recommended. Do not drive while taking an antihistamine.  Take any other medicines prescribed for itching as directed by your caregiver.  Wear loose-fitting clothing.  Keep all follow-up appointments as directed by your caregiver. SEEK MEDICAL CARE IF:   You have persistent or severe itching that is not relieved with medicine.  You have painful or swollen joints. SEEK IMMEDIATE MEDICAL CARE IF:   You have a fever.  Your tongue or lips are swollen.  You have  trouble breathing or swallowing.  You feel tightness in the throat or chest.  You have abdominal pain. These problems may be the first sign of a life-threatening allergic reaction. Call your local emergency services (911 in [place] MAKE SURE YOU:   Understand these instructions.  Will watch your condition.  Will get help right away if you are not doing well or get worse. Document Released: 06/07/2005 Document Revised: 06/12/2013 Document Reviewed: 08/31/2011 [employer] Patient Information 2015 [employer]. This information is not intended to replace advice given to you by your health care provider. Make sure you discuss any questions you have with your health care provider.  

## 2014-08-20 NOTE — Progress Notes (Signed)
Pre visit review using our clinic review tool, if applicable. No additional management support is needed unless otherwise documented below in the visit note. 

## 2014-08-21 ENCOUNTER — Other Ambulatory Visit: Payer: Self-pay | Admitting: Family Medicine

## 2014-08-21 DIAGNOSIS — J189 Pneumonia, unspecified organism: Secondary | ICD-10-CM

## 2014-08-21 LAB — POCT URINALYSIS DIPSTICK
Bilirubin, UA: NEGATIVE
Blood, UA: NEGATIVE
GLUCOSE UA: NEGATIVE
KETONES UA: NEGATIVE
Leukocytes, UA: NEGATIVE
Nitrite, UA: NEGATIVE
Protein, UA: NEGATIVE
SPEC GRAV UA: 1.02
UROBILINOGEN UA: 2
pH, UA: 7

## 2014-08-21 MED ORDER — MOXIFLOXACIN HCL 400 MG PO TABS: 400.0000 mg | ORAL_TABLET | Freq: Every day | ORAL | Status: DC

## 2014-08-21 MED ORDER — METHYLPREDNISOLONE ACETATE 80 MG/ML IJ SUSP
80.0000 mg | Freq: Once | INTRAMUSCULAR | Status: AC
  Administered 2014-08-20: 80 mg via INTRAMUSCULAR

## 2014-08-21 NOTE — Progress Notes (Signed)
Subjective:    Patient ID: Gina Cameron, female    DOB: [birth year], 29 y.o.   MRN: [id number]  HPI  Patient here for c/o inc cough after pneumonia.  She is also c/o urticarial rash that is very itchy.  Past Medical History  Diagnosis Date  . Seizures   . Reflux   . Depression   . Anxiety   . GERD (gastroesophageal reflux disease)   . Hives   . Sturge-Weber syndrome   . Migraine     Review of Systems  Constitutional: Negative for activity change, appetite change, fatigue and unexpected weight change.  Respiratory: Positive for cough and shortness of breath.   Cardiovascular: Negative for chest pain and palpitations.  Skin: Positive for color change and rash.  Psychiatric/Behavioral: Negative for behavioral problems and dysphoric mood. The patient is not nervous/anxious.        Objective:    Physical Exam  Constitutional: She is oriented to person, place, and time. She appears well-developed and well-nourished. No distress.  HENT:  Right Ear: External ear normal.  Left Ear: External ear normal.  Nose: Nose normal.  Mouth/Throat: Oropharynx is clear and moist.  Eyes: EOM are normal. Pupils are equal, round, and reactive to light.  Neck: Normal range of motion. Neck supple.  Cardiovascular: Normal rate, regular rhythm and normal heart sounds.   No murmur heard. Pulmonary/Chest: Effort normal. No accessory muscle usage. No respiratory distress. She has wheezes. She has rales. She exhibits no tenderness.  Neurological: She is alert and oriented to person, place, and time.  Skin: Rash noted. Rash is urticarial. There is erythema.  Psychiatric: She has a normal mood and affect. Her behavior is normal. Judgment and thought content normal.    BP 123/83 mmHg  Pulse 96  Temp(Src) 98.4 F (36.9 C) (Oral)  Wt 222 lb 6.4 oz (100.88 kg)  SpO2 99%  LMP 08/06/2014 Wt Readings from Last 3 Encounters:  08/20/14 222 lb 6.4 oz (100.88 kg)  05/20/14 230 lb 9.6 oz (104.599 kg)    09/12/13 237 lb (107.502 kg)     Lab Results  Component Value Date   WBC 6.2 05/23/2013   HGB 13.5 05/23/2013   HCT 41.8 05/23/2013   PLT 431.0* 05/23/2013   GLUCOSE 83 05/23/2013   CHOL 165 05/23/2013   TRIG 105.0 05/23/2013   HDL 69.10 05/23/2013   LDLCALC 75 05/23/2013   ALT 47* 05/23/2013   AST 20 05/23/2013   NA 137 05/23/2013   K 3.9 05/23/2013   CL 108 05/23/2013   CREATININE 0.8 05/23/2013   BUN 7 05/23/2013   CO2 24 05/23/2013   TSH 2.29 05/23/2013    Dg Chest 2 View  08/21/2014   CLINICAL DATA:  Pneumonia.  Bronchitis.  EXAM: CHEST  2 VIEW  COMPARISON:  CT 12/21/2007.  FINDINGS: Mediastinum and hilar structures normal. Mild infiltrate right middle lobe cannot be excluded. Heart size normal. No pleural effusion or pneumothorax. No acute bony abnormality.  IMPRESSION: Mild infiltrate right middle lobe cannot be excluded.   Electronically Signed   By: Marcello Moores  Register   On: 08/21/2014 08:55       Assessment & Plan:   Problem List Items Addressed This Visit    None    Visit Diagnoses    History of pneumonia    -  Primary    Relevant Orders    DG Chest 2 View (Completed)    Urticaria        Relevant  Medications    predniSONE (DELTASONE) tablet    methylPREDNISolone acetate (DEPO-MEDROL) injection 80 mg (Completed)    Urge incontinence of urine        Relevant Orders    POCT Urinalysis Dipstick (Completed)    Cough        Asthma with exacerbation, mild intermittent        Relevant Medications    predniSONE (DELTASONE) tablet    albuterol (PROAIR HFA) 108 (90 BASE) MCG/ACT inhaler    methylPREDNISolone acetate (DEPO-MEDROL) injection 80 mg (Completed)        Garnet Koyanagi, DO

## 2014-08-22 ENCOUNTER — Other Ambulatory Visit: Payer: Self-pay | Admitting: Family Medicine

## 2014-08-22 ENCOUNTER — Telehealth: Payer: Self-pay | Admitting: Family Medicine

## 2014-08-22 DIAGNOSIS — L500 Allergic urticaria: Secondary | ICD-10-CM

## 2014-08-22 MED ORDER — LEVOFLOXACIN 500 MG PO TABS: 500.0000 mg | ORAL_TABLET | Freq: Every day | ORAL | Status: DC

## 2014-08-22 NOTE — Telephone Encounter (Signed)
levaquin 500 mg 1 po qd x 10 days  

## 2014-08-22 NOTE — Telephone Encounter (Signed)
Caller name: Fils,William Relation to pt: father  Call back number: [phone] Pharmacy: [id number] - [place], [place] [phone] (Phone) [phone] (Fax)         Reason for call:  Inquiring about x ray results and moxifloxacin (AVELOX) 400 MG tablet is not covered requesting another RX. Please advise

## 2014-08-22 NOTE — Telephone Encounter (Signed)
Please advise      KP 

## 2014-08-22 NOTE — Telephone Encounter (Signed)
Gina Cameron has been made aware that the Rx has been faxed.      KP

## 2014-08-22 NOTE — Telephone Encounter (Signed)
The ABX is not covered. Please advise     KP

## 2014-08-22 NOTE — Telephone Encounter (Signed)
i spoke with them last night

## 2014-08-23 DIAGNOSIS — R079 Chest pain, unspecified: Secondary | ICD-10-CM | POA: Diagnosis not present

## 2014-08-23 DIAGNOSIS — L299 Pruritus, unspecified: Secondary | ICD-10-CM | POA: Diagnosis not present

## 2014-08-23 DIAGNOSIS — R05 Cough: Secondary | ICD-10-CM | POA: Diagnosis not present

## 2014-09-29 ENCOUNTER — Other Ambulatory Visit: Payer: Self-pay | Admitting: Family Medicine

## 2014-10-03 DIAGNOSIS — N760 Acute vaginitis: Secondary | ICD-10-CM | POA: Diagnosis not present

## 2014-10-03 DIAGNOSIS — K219 Gastro-esophageal reflux disease without esophagitis: Secondary | ICD-10-CM | POA: Diagnosis not present

## 2014-10-03 DIAGNOSIS — B9789 Other viral agents as the cause of diseases classified elsewhere: Secondary | ICD-10-CM | POA: Diagnosis not present

## 2014-10-03 DIAGNOSIS — Z9049 Acquired absence of other specified parts of digestive tract: Secondary | ICD-10-CM | POA: Diagnosis not present

## 2014-10-03 DIAGNOSIS — B373 Candidiasis of vulva and vagina: Secondary | ICD-10-CM | POA: Diagnosis not present

## 2014-10-28 DIAGNOSIS — Z79899 Other long term (current) drug therapy: Secondary | ICD-10-CM | POA: Diagnosis not present

## 2014-10-28 DIAGNOSIS — R569 Unspecified convulsions: Secondary | ICD-10-CM | POA: Diagnosis not present

## 2014-10-28 DIAGNOSIS — G43909 Migraine, unspecified, not intractable, without status migrainosus: Secondary | ICD-10-CM | POA: Diagnosis not present

## 2014-10-28 DIAGNOSIS — Z884 Allergy status to anesthetic agent status: Secondary | ICD-10-CM | POA: Diagnosis not present

## 2014-10-28 DIAGNOSIS — K219 Gastro-esophageal reflux disease without esophagitis: Secondary | ICD-10-CM | POA: Diagnosis not present

## 2014-10-28 DIAGNOSIS — Z888 Allergy status to other drugs, medicaments and biological substances status: Secondary | ICD-10-CM | POA: Diagnosis not present

## 2014-10-28 DIAGNOSIS — F419 Anxiety disorder, unspecified: Secondary | ICD-10-CM | POA: Diagnosis not present

## 2014-10-28 DIAGNOSIS — Z880 Allergy status to penicillin: Secondary | ICD-10-CM | POA: Diagnosis not present

## 2014-10-28 DIAGNOSIS — E860 Dehydration: Secondary | ICD-10-CM | POA: Diagnosis not present

## 2014-10-28 DIAGNOSIS — F329 Major depressive disorder, single episode, unspecified: Secondary | ICD-10-CM | POA: Diagnosis not present

## 2014-10-28 DIAGNOSIS — Z87891 Personal history of nicotine dependence: Secondary | ICD-10-CM | POA: Diagnosis not present

## 2014-10-29 ENCOUNTER — Other Ambulatory Visit: Payer: Self-pay | Admitting: Family Medicine

## 2014-10-30 DIAGNOSIS — F329 Major depressive disorder, single episode, unspecified: Secondary | ICD-10-CM | POA: Diagnosis not present

## 2014-10-30 DIAGNOSIS — G4733 Obstructive sleep apnea (adult) (pediatric): Secondary | ICD-10-CM | POA: Diagnosis not present

## 2014-10-30 DIAGNOSIS — G43809 Other migraine, not intractable, without status migrainosus: Secondary | ICD-10-CM | POA: Diagnosis not present

## 2015-01-20 ENCOUNTER — Telehealth: Payer: Self-pay | Admitting: Family Medicine

## 2015-01-20 DIAGNOSIS — N739 Female pelvic inflammatory disease, unspecified: Secondary | ICD-10-CM | POA: Diagnosis not present

## 2015-01-20 DIAGNOSIS — A599 Trichomoniasis, unspecified: Secondary | ICD-10-CM | POA: Diagnosis not present

## 2015-01-20 DIAGNOSIS — R350 Frequency of micturition: Secondary | ICD-10-CM | POA: Diagnosis not present

## 2015-01-20 DIAGNOSIS — R109 Unspecified abdominal pain: Secondary | ICD-10-CM | POA: Diagnosis not present

## 2015-01-20 DIAGNOSIS — G40909 Epilepsy, unspecified, not intractable, without status epilepticus: Secondary | ICD-10-CM | POA: Diagnosis not present

## 2015-01-20 DIAGNOSIS — K760 Fatty (change of) liver, not elsewhere classified: Secondary | ICD-10-CM | POA: Diagnosis not present

## 2015-01-20 DIAGNOSIS — D252 Subserosal leiomyoma of uterus: Secondary | ICD-10-CM | POA: Diagnosis not present

## 2015-01-20 DIAGNOSIS — R160 Hepatomegaly, not elsewhere classified: Secondary | ICD-10-CM | POA: Diagnosis not present

## 2015-01-20 DIAGNOSIS — N8320 Unspecified ovarian cysts: Secondary | ICD-10-CM | POA: Diagnosis not present

## 2015-01-20 DIAGNOSIS — R300 Dysuria: Secondary | ICD-10-CM | POA: Diagnosis not present

## 2015-01-20 DIAGNOSIS — R102 Pelvic and perineal pain: Secondary | ICD-10-CM | POA: Diagnosis not present

## 2015-01-20 NOTE — Telephone Encounter (Signed)
Pt father has appt today 4:00pm with Dr. Etter Sjogren. Pt asked if she could be seen at same time. I advised her that no appts were available today with Dr. Etter Sjogren is she is scheduled for tomorrow at 6:15pm. Stating she is having vaginal pain. Please advise if you would like appt changed.

## 2015-01-20 NOTE — Telephone Encounter (Signed)
Father is in the office and he took the patient to the ED earlier today.     KP

## 2015-01-20 NOTE — Telephone Encounter (Signed)
To MD to review and advise      KP 

## 2015-01-20 NOTE — Telephone Encounter (Signed)
Will keep as is for tomorrow.

## 2015-01-20 NOTE — Telephone Encounter (Signed)
If I happened to have a no show I may be able to see her but otherwise that problem usually takes a while-- not a quick visit.

## 2015-01-21 ENCOUNTER — Ambulatory Visit: Payer: Medicare Other | Admitting: Family Medicine

## 2015-01-23 DIAGNOSIS — F329 Major depressive disorder, single episode, unspecified: Secondary | ICD-10-CM | POA: Diagnosis not present

## 2015-01-23 DIAGNOSIS — K219 Gastro-esophageal reflux disease without esophagitis: Secondary | ICD-10-CM | POA: Diagnosis not present

## 2015-01-23 DIAGNOSIS — G43909 Migraine, unspecified, not intractable, without status migrainosus: Secondary | ICD-10-CM | POA: Diagnosis not present

## 2015-01-23 DIAGNOSIS — R569 Unspecified convulsions: Secondary | ICD-10-CM | POA: Diagnosis not present

## 2015-01-23 DIAGNOSIS — E669 Obesity, unspecified: Secondary | ICD-10-CM | POA: Diagnosis not present

## 2015-01-23 DIAGNOSIS — F419 Anxiety disorder, unspecified: Secondary | ICD-10-CM | POA: Diagnosis not present

## 2015-02-17 DIAGNOSIS — R45851 Suicidal ideations: Secondary | ICD-10-CM | POA: Diagnosis not present

## 2015-02-17 DIAGNOSIS — Z880 Allergy status to penicillin: Secondary | ICD-10-CM | POA: Diagnosis not present

## 2015-02-17 DIAGNOSIS — Z79899 Other long term (current) drug therapy: Secondary | ICD-10-CM | POA: Diagnosis not present

## 2015-02-17 DIAGNOSIS — F329 Major depressive disorder, single episode, unspecified: Secondary | ICD-10-CM | POA: Diagnosis not present

## 2015-02-17 DIAGNOSIS — K219 Gastro-esophageal reflux disease without esophagitis: Secondary | ICD-10-CM | POA: Diagnosis not present

## 2015-02-17 DIAGNOSIS — F419 Anxiety disorder, unspecified: Secondary | ICD-10-CM | POA: Diagnosis not present

## 2015-02-17 DIAGNOSIS — Z87891 Personal history of nicotine dependence: Secondary | ICD-10-CM | POA: Diagnosis not present

## 2015-02-17 DIAGNOSIS — G40909 Epilepsy, unspecified, not intractable, without status epilepticus: Secondary | ICD-10-CM | POA: Diagnosis not present

## 2015-02-17 DIAGNOSIS — F70 Mild intellectual disabilities: Secondary | ICD-10-CM | POA: Diagnosis not present

## 2015-02-17 DIAGNOSIS — Z62810 Personal history of physical and sexual abuse in childhood: Secondary | ICD-10-CM | POA: Diagnosis not present

## 2015-02-17 DIAGNOSIS — Z888 Allergy status to other drugs, medicaments and biological substances status: Secondary | ICD-10-CM | POA: Diagnosis not present

## 2015-02-17 DIAGNOSIS — G43909 Migraine, unspecified, not intractable, without status migrainosus: Secondary | ICD-10-CM | POA: Diagnosis not present

## 2015-02-17 DIAGNOSIS — Z6841 Body Mass Index (BMI) 40.0 and over, adult: Secondary | ICD-10-CM | POA: Diagnosis not present

## 2015-02-17 DIAGNOSIS — E669 Obesity, unspecified: Secondary | ICD-10-CM | POA: Diagnosis not present

## 2015-02-18 ENCOUNTER — Telehealth: Payer: Self-pay

## 2015-02-18 DIAGNOSIS — G43909 Migraine, unspecified, not intractable, without status migrainosus: Secondary | ICD-10-CM | POA: Diagnosis not present

## 2015-02-18 DIAGNOSIS — F329 Major depressive disorder, single episode, unspecified: Secondary | ICD-10-CM | POA: Diagnosis not present

## 2015-02-18 DIAGNOSIS — F70 Mild intellectual disabilities: Secondary | ICD-10-CM | POA: Diagnosis not present

## 2015-02-18 DIAGNOSIS — R45851 Suicidal ideations: Secondary | ICD-10-CM | POA: Diagnosis not present

## 2015-02-18 NOTE — Telephone Encounter (Signed)
I have a pts father on the line stating ER wants to involuntarily commit his dtr & he needs Dr. Etter Sjogren to intervene. Pt Gina Cameron, [birth year] [id number]   Spoke with Mr Showman and he stated that [facility] is trying to commit the patient he wants to bring her home and bring her to [facility], I made him aware if they want to commit her then it must be serious, I made him aware if she is suicidal then this would be the best thing for her so she can be on the proper treatment. He insisted on signing her out and bringing her here, I advised it would not be a wise choice, we need to be sure she and everyone else is safe.  He did not agree, I discussed with Dr.Lowne and she agreed with me, I made him aware and he wanted to know where he could take her in [place], I advised [facility].   The clinical social worker Cleone Slim called and advised the Mr.Semper was in the facility and he was upset, she said he did not want his daughter committed, however the patient is wanting to be committed and she is afraid to go home b/c pf her thoughts of harming herself. She wanted to know what Dr.Lowne thought and I advised she agreed having the patient committed. She verbalized understanding and said she would the attending physician aware.    KP

## 2015-02-26 ENCOUNTER — Telehealth: Payer: Self-pay | Admitting: Family Medicine

## 2015-02-26 DIAGNOSIS — F32A Depression, unspecified: Secondary | ICD-10-CM

## 2015-02-26 DIAGNOSIS — F329 Major depressive disorder, single episode, unspecified: Secondary | ICD-10-CM

## 2015-02-26 NOTE — Telephone Encounter (Signed)
Please advise      KP 

## 2015-02-26 NOTE — Telephone Encounter (Signed)
Relation to UV:JDYN Call back number:[phone]   Reason for call:  Father requesting a referral for [place] psychiatrist and psychologist

## 2015-02-27 NOTE — Telephone Encounter (Signed)
Ok to put referral in for psychiatry in [place]

## 2015-02-27 NOTE — Telephone Encounter (Signed)
Ref placed.      KP 

## 2015-03-11 DIAGNOSIS — F331 Major depressive disorder, recurrent, moderate: Secondary | ICD-10-CM | POA: Diagnosis not present

## 2015-03-25 ENCOUNTER — Other Ambulatory Visit: Payer: Self-pay | Admitting: Family Medicine

## 2015-04-08 DIAGNOSIS — G43019 Migraine without aura, intractable, without status migrainosus: Secondary | ICD-10-CM | POA: Diagnosis not present

## 2015-04-17 DIAGNOSIS — Z01419 Encounter for gynecological examination (general) (routine) without abnormal findings: Secondary | ICD-10-CM | POA: Diagnosis not present

## 2015-04-17 DIAGNOSIS — Z3009 Encounter for other general counseling and advice on contraception: Secondary | ICD-10-CM | POA: Diagnosis not present

## 2015-04-17 DIAGNOSIS — F331 Major depressive disorder, recurrent, moderate: Secondary | ICD-10-CM | POA: Diagnosis not present

## 2015-04-17 DIAGNOSIS — R300 Dysuria: Secondary | ICD-10-CM | POA: Diagnosis not present

## 2015-04-17 DIAGNOSIS — Z113 Encounter for screening for infections with a predominantly sexual mode of transmission: Secondary | ICD-10-CM | POA: Diagnosis not present

## 2015-04-17 DIAGNOSIS — R87619 Unspecified abnormal cytological findings in specimens from cervix uteri: Secondary | ICD-10-CM | POA: Diagnosis not present

## 2015-04-22 DIAGNOSIS — F331 Major depressive disorder, recurrent, moderate: Secondary | ICD-10-CM | POA: Diagnosis not present

## 2015-05-25 ENCOUNTER — Other Ambulatory Visit: Payer: Self-pay | Admitting: Family Medicine

## 2015-05-26 NOTE — Telephone Encounter (Signed)
No further refills without seeing PCP.

## 2015-06-04 DIAGNOSIS — J069 Acute upper respiratory infection, unspecified: Secondary | ICD-10-CM | POA: Diagnosis not present

## 2015-06-04 DIAGNOSIS — B9789 Other viral agents as the cause of diseases classified elsewhere: Secondary | ICD-10-CM | POA: Diagnosis not present

## 2015-06-04 DIAGNOSIS — R05 Cough: Secondary | ICD-10-CM | POA: Diagnosis not present

## 2015-06-13 DIAGNOSIS — J40 Bronchitis, not specified as acute or chronic: Secondary | ICD-10-CM | POA: Diagnosis not present

## 2015-06-26 ENCOUNTER — Other Ambulatory Visit: Payer: Self-pay | Admitting: Family Medicine

## 2015-06-26 DIAGNOSIS — R52 Pain, unspecified: Secondary | ICD-10-CM | POA: Diagnosis not present

## 2015-06-26 DIAGNOSIS — R05 Cough: Secondary | ICD-10-CM | POA: Diagnosis not present

## 2015-06-27 ENCOUNTER — Other Ambulatory Visit: Payer: Self-pay | Admitting: Family Medicine

## 2015-08-20 ENCOUNTER — Other Ambulatory Visit: Payer: Self-pay | Admitting: Family Medicine

## 2015-08-26 ENCOUNTER — Other Ambulatory Visit: Payer: Self-pay | Admitting: Family Medicine

## 2015-08-26 NOTE — Telephone Encounter (Signed)
Dr. Etter Sjogren, please advise. Pt last seen 08/20/14 for acute visit. Last CPE 05/23/13.

## 2015-09-01 ENCOUNTER — Other Ambulatory Visit: Payer: Self-pay | Admitting: Family Medicine

## 2015-09-02 ENCOUNTER — Other Ambulatory Visit: Payer: Self-pay

## 2015-09-02 MED ORDER — PANTOPRAZOLE SODIUM 40 MG PO TBEC: 40.0000 mg | DELAYED_RELEASE_TABLET | Freq: Every day | ORAL | Status: DC

## 2015-09-02 NOTE — Telephone Encounter (Signed)
[facility] Rx denied per 08/26/15 refill request as pt needs appointment with PCP or GYN closer to her home. Please call and make pt aware. Thanks!

## 2015-09-03 NOTE — Telephone Encounter (Signed)
Patient scheduled for 2:45 on Tuesday 3/21

## 2015-09-09 ENCOUNTER — Ambulatory Visit (INDEPENDENT_AMBULATORY_CARE_PROVIDER_SITE_OTHER): Payer: Medicare Other | Admitting: Family Medicine

## 2015-09-09 ENCOUNTER — Encounter: Payer: Self-pay | Admitting: Family Medicine

## 2015-09-09 VITALS — BP 121/84 | HR 89 | Temp 98.1°F | Wt 240.6 lb

## 2015-09-09 DIAGNOSIS — G43909 Migraine, unspecified, not intractable, without status migrainosus: Secondary | ICD-10-CM | POA: Diagnosis not present

## 2015-09-09 DIAGNOSIS — K219 Gastro-esophageal reflux disease without esophagitis: Secondary | ICD-10-CM

## 2015-09-09 DIAGNOSIS — L501 Idiopathic urticaria: Secondary | ICD-10-CM | POA: Insufficient documentation

## 2015-09-09 DIAGNOSIS — Z3009 Encounter for other general counseling and advice on contraception: Secondary | ICD-10-CM

## 2015-09-09 DIAGNOSIS — F3162 Bipolar disorder, current episode mixed, moderate: Secondary | ICD-10-CM | POA: Diagnosis not present

## 2015-09-09 LAB — POCT URINE PREGNANCY: PREG TEST UR: NEGATIVE

## 2015-09-09 LAB — POCT URINALYSIS DIPSTICK
BILIRUBIN UA: NEGATIVE
Blood, UA: NEGATIVE
GLUCOSE UA: NEGATIVE
Ketones, UA: NEGATIVE
Leukocytes, UA: NEGATIVE
NITRITE UA: NEGATIVE
Protein, UA: NEGATIVE
Spec Grav, UA: >=1.03
UROBILINOGEN UA: 0.2
pH, UA: 6

## 2015-09-09 MED ORDER — TOPIRAMATE 50 MG PO TABS: 100.0000 mg | ORAL_TABLET | Freq: Every day | ORAL | Status: DC

## 2015-09-09 MED ORDER — DROSPIRENONE-ETHINYL ESTRADIOL 3-0.03 MG PO TABS: 1.0000 | ORAL_TABLET | Freq: Every day | ORAL | Status: DC

## 2015-09-09 MED ORDER — PANTOPRAZOLE SODIUM 40 MG PO TBEC: 40.0000 mg | DELAYED_RELEASE_TABLET | Freq: Every day | ORAL | Status: DC

## 2015-09-09 MED ORDER — RIZATRIPTAN BENZOATE 10 MG PO TBDP: 10.0000 mg | ORAL_TABLET | ORAL | Status: DC | PRN

## 2015-09-09 NOTE — Patient Instructions (Signed)
Bipolar Disorder Bipolar disorder is a mental illness. The term bipolar disorder actually is used to describe a group of disorders that all share varying degrees of emotional highs and lows that can interfere with daily functioning, such as work, school, or relationships. Bipolar disorder also can lead to drug abuse, hospitalization, and suicide. The emotional highs of bipolar disorder are periods of elation or irritability and high energy. These highs can range from a mild form (hypomania) to a severe form (mania). People experiencing episodes of hypomania may appear energetic, excitable, and highly productive. People experiencing mania may behave impulsively or erratically. They often make poor decisions. They may have difficulty sleeping. The most severe episodes of mania can involve having very distorted beliefs or perceptions about the world and seeing or hearing things that are not real (psychotic delusions and hallucinations).  The emotional lows of bipolar disorder (depression) also can range from mild to severe. Severe episodes of bipolar depression can involve psychotic delusions and hallucinations. Sometimes people with bipolar disorder experience a state of mixed mood. Symptoms of hypomania or mania and depression are both present during this mixed-mood episode. SIGNS AND SYMPTOMS There are signs and symptoms of the episodes of hypomania and mania as well as the episodes of depression. The signs and symptoms of hypomania and mania are similar but vary in severity. They include:  Inflated self-esteem or feeling of increased self-confidence.  Decreased need for sleep.  Unusual talkativeness (rapid or pressured speech) or the feeling of a need to keep talking.  Sensation of racing thoughts or constant talking, with quick shifts between topics that may or may not be related (flight of ideas).  Decreased ability to focus or concentrate.  Increased purposeful activity, such as work, studies,  or social activity, or nonproductive activity, such as pacing, squirming and fidgeting, or finger and toe tapping.  Impulsive behavior and use of poor judgment, resulting in high-risk activities, such as having unprotected sex or spending excessive amounts of money. Signs and symptoms of depression include the following:   Feelings of sadness, hopelessness, or helplessness.  Frequent or uncontrollable episodes of crying.  Lack of feeling anything or caring about anything.  Difficulty sleeping or sleeping too much.  Inability to enjoy the things you used to enjoy.   Desire to be alone all the time.   Feelings of guilt or worthlessness.  Lack of energy or motivation.   Difficulty concentrating, remembering, or making decisions.  Change in appetite or weight beyond normal fluctuations.  Thoughts of death or the desire to harm yourself. DIAGNOSIS  Bipolar disorder is diagnosed through an assessment by your caregiver. Your caregiver will ask questions about your emotional episodes. There are two main types of bipolar disorder. People with type I bipolar disorder have manic episodes with or without depressive episodes. People with type II bipolar disorder have hypomanic episodes and major depressive episodes, which are more serious than mild depression. The type of bipolar disorder you have can make an important difference in how your illness is monitored and treated. Your caregiver may ask questions about your medical history and use of alcohol or drugs, including prescription medication. Certain medical conditions and substances also can cause emotional highs and lows that resemble bipolar disorder (secondary bipolar disorder).  TREATMENT  Bipolar disorder is a long-term illness. It is best controlled with continuous treatment rather than treatment only when symptoms occur. The following treatments can be prescribed for bipolar disorders:  Medication--Medication can be prescribed by  a doctor that  is an expert in treating mental disorders (psychiatrists). Medications called mood stabilizers are usually prescribed to help control the illness. Other medications are sometimes added if symptoms of mania, depression, or psychotic delusions and hallucinations occur despite the use of a mood stabilizer. °· Talk therapy--Some forms of talk therapy are helpful in providing support, education, and guidance. °A combination of medication and talk therapy is best for managing the disorder over time. A procedure in which electricity is applied to your brain through your scalp (electroconvulsive therapy) is used in cases of severe mania when medication and talk therapy do not work or work too slowly. °  °This information is not intended to replace advice given to you by your health care provider. Make sure you discuss any questions you have with your health care provider. °  °Document Released: 09/13/2000 Document Revised: 06/28/2014 Document Reviewed: 07/03/2012 °[employer]vier Interactive Patient Education ©2016 [employer] ° °

## 2015-09-09 NOTE — Progress Notes (Signed)
Patient ID: Gina Cameron, female    DOB: 1986-06-10  Age: 30 y.o. MRN: [id number]    Subjective:  Subjective HPI Gina Cameron presents for f/u dx bipolar.  She is seeing psych in [place].  Her father wants her to have labs done.   Review of Systems  Constitutional: Negative for fever, chills, diaphoresis, activity change, appetite change, fatigue and unexpected weight change.  Eyes: Negative for pain, redness and visual disturbance.  Respiratory: Negative for cough, chest tightness, shortness of breath and wheezing.   Cardiovascular: Negative for chest pain, palpitations and leg swelling.  Gastrointestinal: Negative for abdominal pain and abdominal distention.  Endocrine: Negative for cold intolerance, heat intolerance, polydipsia, polyphagia and polyuria.  Genitourinary: Positive for urgency. Negative for dysuria, frequency, hematuria, flank pain, vaginal discharge, enuresis, difficulty urinating, genital sores, vaginal pain, menstrual problem, pelvic pain and dyspareunia.  Musculoskeletal: Negative for back pain.  Neurological: Negative for dizziness, light-headedness, numbness and headaches.    History Past Medical History  Diagnosis Date  . Seizures ([place])   . Reflux   . Depression   . Anxiety   . GERD (gastroesophageal reflux disease)   . Hives   . 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 Migraine     She has past surgical history that includes Cholecystectomy.   Her family history includes Diabetes in her maternal grandmother; Hyperlipidemia in her father and maternal grandmother; Hypertension in her father and maternal grandmother.She reports that she quit smoking about 3 years ago. Her smoking use included Cigarettes. She has a .2 pack-year smoking history. She has never used smokeless tobacco. She reports that she drinks alcohol. She reports that she uses illicit drugs (Marijuana).  Current Outpatient Prescriptions on File Prior to Visit  Medication Sig Dispense Refill  .  albuterol (PROAIR HFA) 108 (90 BASE) MCG/ACT inhaler Inhale 2 puffs into the lungs every 6 (six) hours as needed for wheezing or shortness of breath. 1 Inhaler 5  . Aspirin-Acetaminophen-Caffeine (PAMPRIN MAX Cameron) Take 1 tablet by mouth every 6 (six) hours as needed.    Marland Kitchen buPROPion (WELLBUTRIN XL) 150 MG 24 hr tablet TAKE 1 TABLET BY MOUTH DAILY 30 tablet 0  . ibuprofen (ADVIL,MOTRIN) 200 MG tablet Take 600 mg by mouth every 6 (six) hours as needed for fever or moderate pain.    Marland Kitchen levocetirizine (XYZAL) 5 MG tablet TAKE 1 TABLET BY MOUTH EVERY EVENING 30 tablet 5   No current facility-administered medications on file prior to visit.     Objective:  Objective Physical Exam  Constitutional: She is oriented to person, place, and time. She appears well-developed and well-nourished.  HENT:  Head: Normocephalic and atraumatic.  Eyes: Conjunctivae and EOM are normal.  Neck: Normal range of motion. Neck supple. No JVD present. Carotid bruit is not present. No thyromegaly present.  Cardiovascular: Normal rate, regular rhythm and normal heart sounds.   No murmur heard. Pulmonary/Chest: Effort normal and breath sounds normal. No respiratory distress. She has no wheezes. She has no rales. She exhibits no tenderness.  Musculoskeletal: She exhibits no edema.  Neurological: She is alert and oriented to person, place, and time.  Psychiatric: She has a normal mood and affect.  Nursing note and vitals reviewed.  BP 121/84 mmHg  Pulse 89  Temp(Src) 98.1 F (36.7 C) (Oral)  Wt 240 lb 9.6 oz (109.135 kg)  SpO2 97%  LMP 09/05/2015 Wt Readings from Last 3 Encounters:  09/09/15 240 lb 9.6 oz (109.135 kg)  08/20/14 222 lb 6.4  oz (100.88 kg)  05/20/14 230 lb 9.6 oz (104.599 kg)     Lab Results  Component Value Date   WBC 6.7 09/09/2015   HGB 13.2 09/09/2015   HCT 39.8 09/09/2015   PLT 429.0* 09/09/2015   GLUCOSE 84 09/09/2015   CHOL 165 05/23/2013   TRIG 105.0 05/23/2013   HDL 69.10 05/23/2013     LDLCALC 75 05/23/2013   ALT 41* 09/09/2015   AST 21 09/09/2015   NA 140 09/09/2015   K 4.1 09/09/2015   CL 105 09/09/2015   CREATININE 0.79 09/09/2015   BUN 9 09/09/2015   CO2 27 09/09/2015   TSH 1.44 09/09/2015    Dg Chest 2 View  08/21/2014  CLINICAL DATA:  Pneumonia.  Bronchitis. EXAM: CHEST  2 VIEW COMPARISON:  CT 12/21/2007. FINDINGS: Mediastinum and hilar structures normal. Mild infiltrate right middle lobe cannot be excluded. Heart size normal. No pleural effusion or pneumothorax. No acute bony abnormality. IMPRESSION: Mild infiltrate right middle lobe cannot be excluded. Electronically Signed   By: Gina Cameron  Register   On: 08/21/2014 08:55     Assessment & Plan:  Plan I have discontinued Gina Cameron's multivitamin with minerals, Gina Cameron, Gina Cameron, Gina Cameron, Gina Cameron, Gina Cameron, Gina Cameron, cetirizine, Gina Cameron ointment, and Gina Cameron HCl (Gina Cameron). I have changed her Gina Cameron to Gina Cameron. I have also changed her rizatriptan and topiramate. Additionally, I am having her maintain her Aspirin-Acetaminophen-Caffeine (PAMPRIN MAX Cameron), ibuprofen, albuterol, levocetirizine, buPROPion, Gina Cameron, and pantoprazole.  Meds ordered this encounter  Medications  . DISCONTD: Gina Cameron HCl (Gina Cameron)    Sig: Take 1.5 tablets by mouth daily.  Marland Kitchen DISCONTD: rizatriptan (MAXALT-MLT) 10 MG disintegrating tablet    Sig: Take 10 mg by mouth as needed for migraine. May repeat in 2 hours if needed  . Gina Cameron (Gina) 20 MG TABS tablet    Sig: Take 20 mg by mouth daily.  . Gina Cameron (Gina Cameron) 3-0.03 MG tablet    Sig: Take 1 tablet by mouth daily.    Dispense:  28 tablet    Refill:  2  . pantoprazole (PROTONIX) 40 MG tablet    Sig: Take 1 tablet (40 mg total) by mouth daily. Office visit due now    Dispense:  90 tablet    Refill:  0  . rizatriptan (MAXALT-MLT) 10 MG disintegrating tablet    Sig: Take 1 tablet (10 mg total) by  mouth as needed for migraine. May repeat in 2 hours if needed    Dispense:  10 tablet    Refill:  1  . topiramate (TOPAMAX) 50 MG tablet    Sig: Take 2 tablets (100 mg total) by mouth at bedtime.    Dispense:  60 tablet    Refill:  5    Problem List Items Addressed This Visit      Unprioritized   GERD   Relevant Medications   pantoprazole (PROTONIX) 40 MG tablet   Other Relevant Orders   CBC with Differential/Platelet (Completed)   Comprehensive metabolic panel (Completed)   POCT urinalysis dipstick (Completed)   TSH (Completed)   POCT urine pregnancy (Completed)    Other Visit Diagnoses    Encounter for other general counseling or advice on contraception    -  Primary    Relevant Medications    Gina Cameron (Gina Cameron) 3-0.03 MG tablet    Migraine without status migrainosus, not intractable, unspecified migraine type        Relevant Medications    rizatriptan (MAXALT-MLT) 10 MG disintegrating tablet  topiramate (TOPAMAX) 50 MG tablet    Other Relevant Orders    CBC with Differential/Platelet (Completed)    Comprehensive metabolic panel (Completed)    POCT urinalysis dipstick (Completed)    TSH (Completed)    POCT urine pregnancy (Completed)    Bipolar disorder, current episode mixed, moderate (HCC)        Relevant Orders    CBC with Differential/Platelet (Completed)    Comprehensive metabolic panel (Completed)    POCT urinalysis dipstick (Completed)    TSH (Completed)    POCT urine pregnancy (Completed)       Follow-up: Return in about 1 year (around 09/08/2016), or if symptoms worsen or fail to improve, for annual exam, fasting.  Garnet Koyanagi, DO

## 2015-09-10 LAB — CBC WITH DIFFERENTIAL/PLATELET
BASOS ABS: 0 10*3/uL (ref 0.0–0.1)
Basophils Relative: 0.4 % (ref 0.0–3.0)
EOS ABS: 0.2 10*3/uL (ref 0.0–0.7)
Eosinophils Relative: 3.7 % (ref 0.0–5.0)
HCT: 39.8 % (ref 36.0–46.0)
Hemoglobin: 13.2 g/dL (ref 12.0–15.0)
LYMPHS ABS: 3.2 10*3/uL (ref 0.7–4.0)
Lymphocytes Relative: 47.9 % — ABNORMAL HIGH (ref 12.0–46.0)
MCHC: 33.1 g/dL (ref 30.0–36.0)
MCV: 84.2 fl (ref 78.0–100.0)
MONO ABS: 0.4 10*3/uL (ref 0.1–1.0)
MONOS PCT: 6.7 % (ref 3.0–12.0)
NEUTROS PCT: 41.3 % — AB (ref 43.0–77.0)
Neutro Abs: 2.8 10*3/uL (ref 1.4–7.7)
Platelets: 429 10*3/uL — ABNORMAL HIGH (ref 150.0–400.0)
RBC: 4.72 Mil/uL (ref 3.87–5.11)
RDW: 15 % (ref 11.5–15.5)
WBC: 6.7 10*3/uL (ref 4.0–10.5)

## 2015-09-10 LAB — COMPREHENSIVE METABOLIC PANEL
ALK PHOS: 57 U/L (ref 39–117)
ALT: 41 U/L — AB (ref 0–35)
AST: 21 U/L (ref 0–37)
Albumin: 3.5 g/dL (ref 3.5–5.2)
BILIRUBIN TOTAL: 0.2 mg/dL (ref 0.2–1.2)
BUN: 9 mg/dL (ref 6–23)
CO2: 27 mEq/L (ref 19–32)
Calcium: 9.2 mg/dL (ref 8.4–10.5)
Chloride: 105 mEq/L (ref 96–112)
Creatinine, Ser: 0.79 mg/dL (ref 0.40–1.20)
GFR: 111.87 mL/min (ref >60.00)
GLUCOSE: 84 mg/dL (ref 70–99)
Potassium: 4.1 mEq/L (ref 3.5–5.1)
SODIUM: 140 meq/L (ref 135–145)
TOTAL PROTEIN: 7 g/dL (ref 6.0–8.3)

## 2015-09-10 LAB — TSH: TSH: 1.44 u[IU]/mL (ref 0.35–4.50)

## 2015-09-30 ENCOUNTER — Other Ambulatory Visit: Payer: Self-pay | Admitting: Family Medicine

## 2015-10-12 DIAGNOSIS — R300 Dysuria: Secondary | ICD-10-CM | POA: Diagnosis not present

## 2015-11-09 ENCOUNTER — Other Ambulatory Visit: Payer: Self-pay | Admitting: Family Medicine

## 2015-11-10 NOTE — Telephone Encounter (Signed)
Refilled patient rx request for #30 and 2 rf

## 2015-11-13 ENCOUNTER — Other Ambulatory Visit: Payer: Self-pay | Admitting: Family Medicine

## 2015-11-13 DIAGNOSIS — K219 Gastro-esophageal reflux disease without esophagitis: Secondary | ICD-10-CM

## 2015-11-13 MED ORDER — PANTOPRAZOLE SODIUM 40 MG PO TBEC: 40.0000 mg | DELAYED_RELEASE_TABLET | Freq: Every day | ORAL | Status: DC

## 2015-11-13 NOTE — Telephone Encounter (Signed)
Rx sent 

## 2015-11-28 ENCOUNTER — Other Ambulatory Visit: Payer: Self-pay | Admitting: Family Medicine

## 2015-11-28 ENCOUNTER — Other Ambulatory Visit: Payer: Self-pay | Admitting: Family

## 2015-12-03 DIAGNOSIS — L508 Other urticaria: Secondary | ICD-10-CM | POA: Diagnosis not present

## 2016-01-14 DIAGNOSIS — S40021A Contusion of right upper arm, initial encounter: Secondary | ICD-10-CM | POA: Diagnosis not present

## 2016-01-14 DIAGNOSIS — M545 Low back pain: Secondary | ICD-10-CM | POA: Diagnosis not present

## 2016-01-14 DIAGNOSIS — S161XXA Strain of muscle, fascia and tendon at neck level, initial encounter: Secondary | ICD-10-CM | POA: Diagnosis not present

## 2016-01-22 DIAGNOSIS — G44311 Acute post-traumatic headache, intractable: Secondary | ICD-10-CM | POA: Diagnosis not present

## 2016-01-22 DIAGNOSIS — M25561 Pain in right knee: Secondary | ICD-10-CM | POA: Diagnosis not present

## 2016-01-22 DIAGNOSIS — M542 Cervicalgia: Secondary | ICD-10-CM | POA: Diagnosis not present

## 2016-01-22 DIAGNOSIS — R110 Nausea: Secondary | ICD-10-CM | POA: Diagnosis not present

## 2016-01-22 DIAGNOSIS — M545 Low back pain: Secondary | ICD-10-CM | POA: Diagnosis not present

## 2016-01-22 DIAGNOSIS — M79601 Pain in right arm: Secondary | ICD-10-CM | POA: Diagnosis not present

## 2016-01-22 DIAGNOSIS — M79604 Pain in right leg: Secondary | ICD-10-CM | POA: Diagnosis not present

## 2016-01-22 DIAGNOSIS — M25511 Pain in right shoulder: Secondary | ICD-10-CM | POA: Diagnosis not present

## 2016-01-22 DIAGNOSIS — R51 Headache: Secondary | ICD-10-CM | POA: Diagnosis not present

## 2016-01-22 DIAGNOSIS — Z87891 Personal history of nicotine dependence: Secondary | ICD-10-CM | POA: Diagnosis not present

## 2016-01-22 DIAGNOSIS — Z041 Encounter for examination and observation following transport accident: Secondary | ICD-10-CM | POA: Diagnosis not present

## 2016-01-22 DIAGNOSIS — H538 Other visual disturbances: Secondary | ICD-10-CM | POA: Diagnosis not present

## 2016-01-28 DIAGNOSIS — M25561 Pain in right knee: Secondary | ICD-10-CM | POA: Diagnosis not present

## 2016-02-17 ENCOUNTER — Other Ambulatory Visit: Payer: Self-pay | Admitting: Family Medicine

## 2016-02-26 DIAGNOSIS — M25561 Pain in right knee: Secondary | ICD-10-CM | POA: Diagnosis not present

## 2016-03-01 DIAGNOSIS — S83231A Complex tear of medial meniscus, current injury, right knee, initial encounter: Secondary | ICD-10-CM | POA: Insufficient documentation

## 2016-03-10 DIAGNOSIS — G43019 Migraine without aura, intractable, without status migrainosus: Secondary | ICD-10-CM | POA: Diagnosis not present

## 2016-03-10 DIAGNOSIS — Z7689 Persons encountering health services in other specified circumstances: Secondary | ICD-10-CM | POA: Diagnosis not present

## 2016-03-10 DIAGNOSIS — Q858 Other phakomatoses, not elsewhere classified: Secondary | ICD-10-CM | POA: Diagnosis not present

## 2016-03-10 DIAGNOSIS — F331 Major depressive disorder, recurrent, moderate: Secondary | ICD-10-CM | POA: Diagnosis not present

## 2016-03-10 DIAGNOSIS — N921 Excessive and frequent menstruation with irregular cycle: Secondary | ICD-10-CM | POA: Diagnosis not present

## 2016-03-18 DIAGNOSIS — N858 Other specified noninflammatory disorders of uterus: Secondary | ICD-10-CM | POA: Diagnosis not present

## 2016-03-18 DIAGNOSIS — D252 Subserosal leiomyoma of uterus: Secondary | ICD-10-CM | POA: Diagnosis not present

## 2016-03-18 DIAGNOSIS — D250 Submucous leiomyoma of uterus: Secondary | ICD-10-CM | POA: Diagnosis not present

## 2016-03-23 ENCOUNTER — Other Ambulatory Visit: Payer: Self-pay | Admitting: Family Medicine

## 2016-04-07 DIAGNOSIS — Z7189 Other specified counseling: Secondary | ICD-10-CM | POA: Diagnosis not present

## 2016-04-11 ENCOUNTER — Other Ambulatory Visit: Payer: Self-pay | Admitting: Family Medicine

## 2016-04-11 DIAGNOSIS — G43909 Migraine, unspecified, not intractable, without status migrainosus: Secondary | ICD-10-CM

## 2016-04-12 ENCOUNTER — Other Ambulatory Visit: Payer: Self-pay | Admitting: Family Medicine

## 2016-04-12 DIAGNOSIS — G43909 Migraine, unspecified, not intractable, without status migrainosus: Secondary | ICD-10-CM

## 2016-04-20 DIAGNOSIS — N921 Excessive and frequent menstruation with irregular cycle: Secondary | ICD-10-CM | POA: Diagnosis not present

## 2016-05-06 ENCOUNTER — Other Ambulatory Visit: Payer: Self-pay | Admitting: Family Medicine

## 2016-05-07 DIAGNOSIS — N939 Abnormal uterine and vaginal bleeding, unspecified: Secondary | ICD-10-CM | POA: Diagnosis not present

## 2016-05-07 DIAGNOSIS — R1084 Generalized abdominal pain: Secondary | ICD-10-CM | POA: Diagnosis not present

## 2016-05-26 ENCOUNTER — Telehealth: Payer: Self-pay | Admitting: Family Medicine

## 2016-05-26 NOTE — Telephone Encounter (Signed)
Contacted patient to schedule medicare wellness patient stated she will have to speak with her father and call back in the morning.

## 2016-05-27 NOTE — Telephone Encounter (Signed)
Patient stated she relocated to [facility] and would have to speak with her father regarding transportation, patient states she will call back to schedule.

## 2016-06-03 DIAGNOSIS — N921 Excessive and frequent menstruation with irregular cycle: Secondary | ICD-10-CM | POA: Diagnosis not present

## 2016-06-03 DIAGNOSIS — N840 Polyp of corpus uteri: Secondary | ICD-10-CM | POA: Diagnosis not present

## 2016-06-03 DIAGNOSIS — D250 Submucous leiomyoma of uterus: Secondary | ICD-10-CM | POA: Diagnosis not present

## 2016-06-22 DIAGNOSIS — Q858 Other phakomatoses, not elsewhere classified: Secondary | ICD-10-CM | POA: Diagnosis not present

## 2016-06-22 DIAGNOSIS — G43909 Migraine, unspecified, not intractable, without status migrainosus: Secondary | ICD-10-CM | POA: Diagnosis not present

## 2016-06-28 ENCOUNTER — Other Ambulatory Visit: Payer: Self-pay | Admitting: Family Medicine

## 2016-06-29 DIAGNOSIS — Z8669 Personal history of other diseases of the nervous system and sense organs: Secondary | ICD-10-CM | POA: Insufficient documentation

## 2016-06-29 DIAGNOSIS — N939 Abnormal uterine and vaginal bleeding, unspecified: Secondary | ICD-10-CM | POA: Insufficient documentation

## 2016-06-29 DIAGNOSIS — Z01818 Encounter for other preprocedural examination: Secondary | ICD-10-CM | POA: Diagnosis not present

## 2016-06-29 DIAGNOSIS — D250 Submucous leiomyoma of uterus: Secondary | ICD-10-CM | POA: Diagnosis not present

## 2016-06-29 DIAGNOSIS — Z23 Encounter for immunization: Secondary | ICD-10-CM | POA: Diagnosis not present

## 2016-06-29 DIAGNOSIS — N840 Polyp of corpus uteri: Secondary | ICD-10-CM | POA: Diagnosis not present

## 2016-06-29 DIAGNOSIS — N921 Excessive and frequent menstruation with irregular cycle: Secondary | ICD-10-CM | POA: Diagnosis not present

## 2016-07-02 DIAGNOSIS — N946 Dysmenorrhea, unspecified: Secondary | ICD-10-CM | POA: Diagnosis not present

## 2016-07-02 DIAGNOSIS — D250 Submucous leiomyoma of uterus: Secondary | ICD-10-CM | POA: Diagnosis not present

## 2016-07-02 DIAGNOSIS — R102 Pelvic and perineal pain: Secondary | ICD-10-CM | POA: Diagnosis not present

## 2016-07-02 DIAGNOSIS — Q858 Other phakomatoses, not elsewhere classified: Secondary | ICD-10-CM | POA: Diagnosis not present

## 2016-07-02 DIAGNOSIS — Z6841 Body Mass Index (BMI) 40.0 and over, adult: Secondary | ICD-10-CM | POA: Diagnosis not present

## 2016-07-02 DIAGNOSIS — N921 Excessive and frequent menstruation with irregular cycle: Secondary | ICD-10-CM | POA: Diagnosis not present

## 2016-07-02 DIAGNOSIS — F329 Major depressive disorder, single episode, unspecified: Secondary | ICD-10-CM | POA: Diagnosis not present

## 2016-07-02 DIAGNOSIS — Z87891 Personal history of nicotine dependence: Secondary | ICD-10-CM | POA: Diagnosis not present

## 2016-07-02 DIAGNOSIS — Z79899 Other long term (current) drug therapy: Secondary | ICD-10-CM | POA: Diagnosis not present

## 2016-07-02 DIAGNOSIS — R569 Unspecified convulsions: Secondary | ICD-10-CM | POA: Diagnosis not present

## 2016-07-02 DIAGNOSIS — K219 Gastro-esophageal reflux disease without esophagitis: Secondary | ICD-10-CM | POA: Diagnosis not present

## 2016-07-02 DIAGNOSIS — Z7982 Long term (current) use of aspirin: Secondary | ICD-10-CM | POA: Diagnosis not present

## 2016-07-02 DIAGNOSIS — Z791 Long term (current) use of non-steroidal anti-inflammatories (NSAID): Secondary | ICD-10-CM | POA: Diagnosis not present

## 2016-07-02 DIAGNOSIS — N840 Polyp of corpus uteri: Secondary | ICD-10-CM | POA: Diagnosis not present

## 2016-07-02 DIAGNOSIS — G4730 Sleep apnea, unspecified: Secondary | ICD-10-CM | POA: Diagnosis not present

## 2016-07-08 ENCOUNTER — Other Ambulatory Visit: Payer: Self-pay | Admitting: Family Medicine

## 2016-07-08 DIAGNOSIS — G43909 Migraine, unspecified, not intractable, without status migrainosus: Secondary | ICD-10-CM

## 2016-07-09 DIAGNOSIS — Q283 Other malformations of cerebral vessels: Secondary | ICD-10-CM | POA: Diagnosis not present

## 2016-07-09 DIAGNOSIS — Q858 Other phakomatoses, not elsewhere classified: Secondary | ICD-10-CM | POA: Diagnosis not present

## 2016-07-09 DIAGNOSIS — G43909 Migraine, unspecified, not intractable, without status migrainosus: Secondary | ICD-10-CM | POA: Diagnosis not present

## 2016-07-09 DIAGNOSIS — R930 Abnormal findings on diagnostic imaging of skull and head, not elsewhere classified: Secondary | ICD-10-CM | POA: Diagnosis not present

## 2016-07-14 DIAGNOSIS — R1032 Left lower quadrant pain: Secondary | ICD-10-CM | POA: Diagnosis not present

## 2016-07-14 DIAGNOSIS — R1031 Right lower quadrant pain: Secondary | ICD-10-CM | POA: Diagnosis not present

## 2016-07-14 DIAGNOSIS — G8918 Other acute postprocedural pain: Secondary | ICD-10-CM | POA: Diagnosis not present

## 2016-08-12 ENCOUNTER — Other Ambulatory Visit: Payer: Self-pay | Admitting: Family Medicine

## 2016-08-12 NOTE — Telephone Encounter (Signed)
lvm advising patient to scheduled AWV

## 2016-08-23 DIAGNOSIS — R52 Pain, unspecified: Secondary | ICD-10-CM | POA: Diagnosis not present

## 2016-08-23 DIAGNOSIS — Z8669 Personal history of other diseases of the nervous system and sense organs: Secondary | ICD-10-CM | POA: Diagnosis not present

## 2016-08-23 DIAGNOSIS — J020 Streptococcal pharyngitis: Secondary | ICD-10-CM | POA: Diagnosis not present

## 2016-09-30 ENCOUNTER — Other Ambulatory Visit: Payer: Self-pay | Admitting: Family Medicine

## 2016-09-30 DIAGNOSIS — G43909 Migraine, unspecified, not intractable, without status migrainosus: Secondary | ICD-10-CM

## 2016-10-13 DIAGNOSIS — Q858 Other phakomatoses, not elsewhere classified: Secondary | ICD-10-CM | POA: Diagnosis not present

## 2016-10-13 DIAGNOSIS — Z79899 Other long term (current) drug therapy: Secondary | ICD-10-CM | POA: Diagnosis not present

## 2016-10-13 DIAGNOSIS — G43909 Migraine, unspecified, not intractable, without status migrainosus: Secondary | ICD-10-CM | POA: Diagnosis not present

## 2016-10-26 ENCOUNTER — Other Ambulatory Visit: Payer: Self-pay | Admitting: Family Medicine

## 2016-10-26 DIAGNOSIS — G43909 Migraine, unspecified, not intractable, without status migrainosus: Secondary | ICD-10-CM

## 2016-11-23 ENCOUNTER — Other Ambulatory Visit: Payer: Self-pay | Admitting: Family Medicine

## 2016-11-23 DIAGNOSIS — G43909 Migraine, unspecified, not intractable, without status migrainosus: Secondary | ICD-10-CM

## 2016-12-10 ENCOUNTER — Other Ambulatory Visit: Payer: Self-pay | Admitting: Family Medicine

## 2016-12-21 ENCOUNTER — Other Ambulatory Visit: Payer: Self-pay | Admitting: Family Medicine

## 2017-01-07 ENCOUNTER — Other Ambulatory Visit: Payer: Self-pay | Admitting: Family Medicine

## 2017-01-28 ENCOUNTER — Other Ambulatory Visit: Payer: Self-pay | Admitting: Family Medicine

## 2017-02-23 ENCOUNTER — Other Ambulatory Visit: Payer: Self-pay | Admitting: Family Medicine

## 2017-02-24 NOTE — Telephone Encounter (Signed)
Has not been seen since 3.21.2017/needs appt first/thx dmf

## 2017-04-08 DIAGNOSIS — Z6841 Body Mass Index (BMI) 40.0 and over, adult: Secondary | ICD-10-CM | POA: Diagnosis not present

## 2017-04-08 DIAGNOSIS — F331 Major depressive disorder, recurrent, moderate: Secondary | ICD-10-CM | POA: Diagnosis not present

## 2017-04-18 DIAGNOSIS — L2084 Intrinsic (allergic) eczema: Secondary | ICD-10-CM | POA: Diagnosis not present

## 2017-04-18 DIAGNOSIS — L505 Cholinergic urticaria: Secondary | ICD-10-CM | POA: Diagnosis not present

## 2017-04-18 DIAGNOSIS — S40262A Insect bite (nonvenomous) of left shoulder, initial encounter: Secondary | ICD-10-CM | POA: Diagnosis not present

## 2017-04-18 DIAGNOSIS — L810 Postinflammatory hyperpigmentation: Secondary | ICD-10-CM | POA: Diagnosis not present

## 2017-04-18 DIAGNOSIS — F17200 Nicotine dependence, unspecified, uncomplicated: Secondary | ICD-10-CM | POA: Diagnosis not present

## 2017-07-01 DIAGNOSIS — Z6841 Body Mass Index (BMI) 40.0 and over, adult: Secondary | ICD-10-CM | POA: Diagnosis not present

## 2017-07-01 DIAGNOSIS — N939 Abnormal uterine and vaginal bleeding, unspecified: Secondary | ICD-10-CM | POA: Diagnosis not present

## 2017-07-01 DIAGNOSIS — F331 Major depressive disorder, recurrent, moderate: Secondary | ICD-10-CM | POA: Diagnosis not present

## 2017-07-01 DIAGNOSIS — L501 Idiopathic urticaria: Secondary | ICD-10-CM | POA: Diagnosis not present

## 2017-07-01 DIAGNOSIS — Z23 Encounter for immunization: Secondary | ICD-10-CM | POA: Diagnosis not present

## 2017-08-10 DIAGNOSIS — G43811 Other migraine, intractable, with status migrainosus: Secondary | ICD-10-CM | POA: Diagnosis not present

## 2017-08-25 DIAGNOSIS — J029 Acute pharyngitis, unspecified: Secondary | ICD-10-CM | POA: Diagnosis not present

## 2017-08-25 DIAGNOSIS — J069 Acute upper respiratory infection, unspecified: Secondary | ICD-10-CM | POA: Diagnosis not present

## 2017-09-06 DIAGNOSIS — F331 Major depressive disorder, recurrent, moderate: Secondary | ICD-10-CM | POA: Diagnosis not present

## 2017-09-06 DIAGNOSIS — F4310 Post-traumatic stress disorder, unspecified: Secondary | ICD-10-CM | POA: Diagnosis not present

## 2017-09-17 DIAGNOSIS — K219 Gastro-esophageal reflux disease without esophagitis: Secondary | ICD-10-CM | POA: Diagnosis not present

## 2017-09-17 DIAGNOSIS — L509 Urticaria, unspecified: Secondary | ICD-10-CM | POA: Diagnosis not present

## 2017-10-12 DIAGNOSIS — L02215 Cutaneous abscess of perineum: Secondary | ICD-10-CM | POA: Diagnosis not present

## 2017-11-30 DIAGNOSIS — R1084 Generalized abdominal pain: Secondary | ICD-10-CM | POA: Diagnosis not present

## 2017-11-30 DIAGNOSIS — N912 Amenorrhea, unspecified: Secondary | ICD-10-CM | POA: Diagnosis not present

## 2017-12-12 DIAGNOSIS — Z6841 Body Mass Index (BMI) 40.0 and over, adult: Secondary | ICD-10-CM | POA: Diagnosis not present

## 2017-12-12 DIAGNOSIS — R1031 Right lower quadrant pain: Secondary | ICD-10-CM | POA: Diagnosis not present

## 2017-12-12 DIAGNOSIS — R1032 Left lower quadrant pain: Secondary | ICD-10-CM | POA: Diagnosis not present

## 2017-12-12 DIAGNOSIS — Z3200 Encounter for pregnancy test, result unknown: Secondary | ICD-10-CM | POA: Diagnosis not present

## 2017-12-12 DIAGNOSIS — E2839 Other primary ovarian failure: Secondary | ICD-10-CM | POA: Diagnosis not present

## 2017-12-13 DIAGNOSIS — R1031 Right lower quadrant pain: Secondary | ICD-10-CM | POA: Diagnosis not present

## 2017-12-13 DIAGNOSIS — K5901 Slow transit constipation: Secondary | ICD-10-CM | POA: Diagnosis not present

## 2017-12-13 DIAGNOSIS — R1032 Left lower quadrant pain: Secondary | ICD-10-CM | POA: Diagnosis not present

## 2017-12-13 DIAGNOSIS — R194 Change in bowel habit: Secondary | ICD-10-CM | POA: Diagnosis not present

## 2017-12-13 DIAGNOSIS — M6289 Other specified disorders of muscle: Secondary | ICD-10-CM | POA: Diagnosis not present

## 2017-12-19 DIAGNOSIS — R1032 Left lower quadrant pain: Secondary | ICD-10-CM | POA: Diagnosis not present

## 2017-12-19 DIAGNOSIS — K440 Diaphragmatic hernia with obstruction, without gangrene: Secondary | ICD-10-CM | POA: Diagnosis not present

## 2017-12-19 DIAGNOSIS — Z79899 Other long term (current) drug therapy: Secondary | ICD-10-CM | POA: Diagnosis not present

## 2017-12-19 DIAGNOSIS — K449 Diaphragmatic hernia without obstruction or gangrene: Secondary | ICD-10-CM | POA: Diagnosis not present

## 2017-12-19 DIAGNOSIS — Z6841 Body Mass Index (BMI) 40.0 and over, adult: Secondary | ICD-10-CM | POA: Diagnosis not present

## 2017-12-19 DIAGNOSIS — K5900 Constipation, unspecified: Secondary | ICD-10-CM | POA: Diagnosis not present

## 2017-12-19 DIAGNOSIS — G4730 Sleep apnea, unspecified: Secondary | ICD-10-CM | POA: Diagnosis not present

## 2017-12-19 DIAGNOSIS — R109 Unspecified abdominal pain: Secondary | ICD-10-CM | POA: Diagnosis not present

## 2017-12-19 DIAGNOSIS — K921 Melena: Secondary | ICD-10-CM | POA: Diagnosis not present

## 2017-12-19 DIAGNOSIS — E669 Obesity, unspecified: Secondary | ICD-10-CM | POA: Diagnosis not present

## 2017-12-19 DIAGNOSIS — Q858 Other phakomatoses, not elsewhere classified: Secondary | ICD-10-CM | POA: Diagnosis not present

## 2017-12-19 DIAGNOSIS — K219 Gastro-esophageal reflux disease without esophagitis: Secondary | ICD-10-CM | POA: Diagnosis not present

## 2017-12-19 DIAGNOSIS — Z720 Tobacco use: Secondary | ICD-10-CM | POA: Diagnosis not present

## 2017-12-19 DIAGNOSIS — Z791 Long term (current) use of non-steroidal anti-inflammatories (NSAID): Secondary | ICD-10-CM | POA: Diagnosis not present

## 2017-12-19 DIAGNOSIS — R1031 Right lower quadrant pain: Secondary | ICD-10-CM | POA: Diagnosis not present

## 2018-02-15 DIAGNOSIS — R1084 Generalized abdominal pain: Secondary | ICD-10-CM | POA: Diagnosis not present

## 2018-02-15 DIAGNOSIS — N912 Amenorrhea, unspecified: Secondary | ICD-10-CM | POA: Diagnosis not present

## 2018-02-23 ENCOUNTER — Encounter: Payer: Self-pay | Admitting: Family Medicine

## 2018-02-23 ENCOUNTER — Ambulatory Visit (INDEPENDENT_AMBULATORY_CARE_PROVIDER_SITE_OTHER): Payer: Medicare Other | Admitting: Family Medicine

## 2018-02-23 ENCOUNTER — Ambulatory Visit: Payer: Self-pay | Admitting: Family Medicine

## 2018-02-23 ENCOUNTER — Encounter: Payer: Self-pay | Admitting: Neurology

## 2018-02-23 VITALS — BP 119/67 | HR 82 | Temp 98.3°F | Resp 16 | Ht 60.0 in | Wt 223.6 lb

## 2018-02-23 DIAGNOSIS — N83209 Unspecified ovarian cyst, unspecified side: Secondary | ICD-10-CM

## 2018-02-23 DIAGNOSIS — G43909 Migraine, unspecified, not intractable, without status migrainosus: Secondary | ICD-10-CM

## 2018-02-23 DIAGNOSIS — G8929 Other chronic pain: Secondary | ICD-10-CM | POA: Diagnosis not present

## 2018-02-23 DIAGNOSIS — L2082 Flexural eczema: Secondary | ICD-10-CM | POA: Diagnosis not present

## 2018-02-23 DIAGNOSIS — R569 Unspecified convulsions: Secondary | ICD-10-CM | POA: Insufficient documentation

## 2018-02-23 DIAGNOSIS — M25561 Pain in right knee: Secondary | ICD-10-CM | POA: Diagnosis not present

## 2018-02-23 DIAGNOSIS — R748 Abnormal levels of other serum enzymes: Secondary | ICD-10-CM

## 2018-02-23 DIAGNOSIS — N926 Irregular menstruation, unspecified: Secondary | ICD-10-CM

## 2018-02-23 DIAGNOSIS — L680 Hirsutism: Secondary | ICD-10-CM | POA: Diagnosis not present

## 2018-02-23 DIAGNOSIS — G43009 Migraine without aura, not intractable, without status migrainosus: Secondary | ICD-10-CM | POA: Diagnosis not present

## 2018-02-23 DIAGNOSIS — R6250 Unspecified lack of expected normal physiological development in childhood: Secondary | ICD-10-CM | POA: Insufficient documentation

## 2018-02-23 LAB — CBC WITH DIFFERENTIAL/PLATELET
BASOS PCT: 1.3 % (ref 0.0–3.0)
Basophils Absolute: 0.1 10*3/uL (ref 0.0–0.1)
EOS PCT: 8.8 % — AB (ref 0.0–5.0)
Eosinophils Absolute: 0.5 10*3/uL (ref 0.0–0.7)
HEMATOCRIT: 40.1 % (ref 36.0–46.0)
HEMOGLOBIN: 13.4 g/dL (ref 12.0–15.0)
LYMPHS PCT: 51.5 % — AB (ref 12.0–46.0)
Lymphs Abs: 2.9 10*3/uL (ref 0.7–4.0)
MCHC: 33.5 g/dL (ref 30.0–36.0)
MCV: 85.9 fl (ref 78.0–100.0)
Monocytes Absolute: 0.5 10*3/uL (ref 0.1–1.0)
Monocytes Relative: 8.4 % (ref 3.0–12.0)
NEUTROS ABS: 1.7 10*3/uL (ref 1.4–7.7)
Neutrophils Relative %: 30 % — ABNORMAL LOW (ref 43.0–77.0)
PLATELETS: 403 10*3/uL — AB (ref 150.0–400.0)
RBC: 4.67 Mil/uL (ref 3.87–5.11)
RDW: 14.5 % (ref 11.5–15.5)
WBC: 5.7 10*3/uL (ref 4.0–10.5)

## 2018-02-23 LAB — TSH: TSH: 1.62 u[IU]/mL (ref 0.35–4.50)

## 2018-02-23 LAB — COMPREHENSIVE METABOLIC PANEL
ALBUMIN: 3.5 g/dL (ref 3.5–5.2)
ALT: 24 U/L (ref 0–35)
AST: 16 U/L (ref 0–37)
Alkaline Phosphatase: 56 U/L (ref 39–117)
BUN: 9 mg/dL (ref 6–23)
CO2: 24 meq/L (ref 19–32)
CREATININE: 0.72 mg/dL (ref 0.40–1.20)
Calcium: 9.2 mg/dL (ref 8.4–10.5)
Chloride: 110 mEq/L (ref 96–112)
GFR: 122.36 mL/min (ref >60.00)
GLUCOSE: 82 mg/dL (ref 70–99)
POTASSIUM: 4.3 meq/L (ref 3.5–5.1)
Sodium: 140 mEq/L (ref 135–145)
Total Bilirubin: 0.2 mg/dL (ref 0.2–1.2)
Total Protein: 6.7 g/dL (ref 6.0–8.3)

## 2018-02-23 LAB — LIPID PANEL
CHOL/HDL RATIO: 2
Cholesterol: 112 mg/dL (ref 0–200)
HDL: 56.3 mg/dL (ref >39.00)
LDL CALC: 38 mg/dL (ref 0–99)
NONHDL: 55.96
TRIGLYCERIDES: 92 mg/dL (ref 0.0–149.0)
VLDL: 18.4 mg/dL (ref 0.0–40.0)

## 2018-02-23 NOTE — Progress Notes (Signed)
Patient ID: DENYLA CORTESE, female    DOB: 14-Jun-1986  Age: 32 y.o. MRN: [id number]    Subjective:  Subjective  HPI CERYS WINGET presents to reestablish-- she has irregular periods and hx ovarian cyst.  She needs gyn referral-- she is taking bcp.  She also needs a referral for neuro for migraines, derm for her eczema and psych for her depression/ anxiety.    Review of Systems  Constitutional: Negative for chills and fever.  HENT: Negative for congestion and hearing loss.   Eyes: Negative for discharge.  Respiratory: Negative for cough and shortness of breath.   Cardiovascular: Negative for chest pain, palpitations and leg swelling.  Gastrointestinal: Negative for abdominal pain, blood in stool, constipation, diarrhea, nausea and vomiting.  Genitourinary: Positive for menstrual problem and pelvic pain. Negative for dysuria, frequency, hematuria and urgency.  Musculoskeletal: Negative for back pain and myalgias.  Skin: Negative for rash.  Allergic/Immunologic: Negative for environmental allergies.  Neurological: Negative for dizziness, weakness and headaches.  Hematological: Does not bruise/bleed easily.  Psychiatric/Behavioral: Negative for suicidal ideas. The patient is not nervous/anxious.     History Past Medical History:  Diagnosis Date  . Anxiety   . Depression   . GERD (gastroesophageal reflux disease)   . Hives   . Migraine   . Reflux   . Seizures ([place])   . 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   Her family history includes Diabetes in her maternal grandmother; Hyperlipidemia in her father and maternal grandmother; Hypertension in her father and maternal grandmother.She reports that she quit smoking about 5 years ago. Her smoking use included cigarettes. She has a 0.20 pack-year smoking history. She has never used smokeless tobacco. She reports that she drinks alcohol. She reports that she has current or past  drug history. Drug: Marijuana.  Current Outpatient Medications on File Prior to Visit  Medication Sig Dispense Refill  . albuterol (PROVENTIL HFA;VENTOLIN HFA) 108 (90 Base) MCG/ACT inhaler Inhale 2 puffs into the lungs every 4 (four) hours.    . betamethasone valerate ointment (VALISONE) 0.1 % Apply 1 application topically 2 (two) times daily.    . cetirizine (ZYRTEC) 10 MG tablet Take 1 tablet by mouth daily.    . drospirenone-ethinyl estradiol (YASMIN,ZARAH,SYEDA) 3-0.03 MG tablet Take 1 tablet by mouth daily.    Marland Kitchen ibuprofen (ADVIL,MOTRIN) 800 MG tablet Take 1 tablet by mouth every 6 (six) hours as needed for pain.    Marland Kitchen levocetirizine (XYZAL) 5 MG tablet Take 1 tablet by mouth every morning.    . pantoprazole (PROTONIX) 40 MG tablet Take 1 tablet by mouth daily.    . rizatriptan (MAXALT-MLT) 10 MG disintegrating tablet Take 1 tablet by mouth as needed.    . topiramate (TOPAMAX) 100 MG tablet Take 1 tablet by mouth 2 (two) times daily.     No current facility-administered medications on file prior to visit.      Objective:  Objective  Physical Exam  Constitutional: She is oriented to person, place, and time. She appears well-developed and well-nourished.  HENT:  Head: Normocephalic and atraumatic.  Eyes: Conjunctivae and EOM are normal.  Neck: Normal range of motion. Neck supple. No JVD present. Carotid bruit is not present. No thyromegaly present.  Cardiovascular: Normal rate, regular rhythm and normal heart sounds.  No murmur heard. Pulmonary/Chest: Effort normal and breath sounds normal. No respiratory distress. She has no wheezes. She has no rales. She exhibits no  tenderness.  Musculoskeletal: She exhibits no edema.  Neurological: She is alert and oriented to person, place, and time.  Psychiatric: She has a normal mood and affect.  Nursing note and vitals reviewed.  BP 119/67 (BP Location: Left Arm, Cuff Size: Large)   Pulse 82   Temp 98.3 F (36.8 C) (Oral)   Resp 16   Ht  5' (1.524 m)   Wt 223 lb 9.6 oz (101.4 kg)   SpO2 100%   BMI 43.67 kg/m  Wt Readings from Last 3 Encounters:  02/23/18 223 lb 9.6 oz (101.4 kg)  09/09/15 240 lb 9.6 oz (109.1 kg)  08/20/14 222 lb 6.4 oz (100.9 kg)     Lab Results  Component Value Date   WBC 5.7 02/23/2018   HGB 13.4 02/23/2018   HCT 40.1 02/23/2018   PLT 403.0 (H) 02/23/2018   GLUCOSE 82 02/23/2018   CHOL 112 02/23/2018   TRIG 92.0 02/23/2018   HDL 56.30 02/23/2018   LDLCALC 38 02/23/2018   ALT 24 02/23/2018   AST 16 02/23/2018   NA 140 02/23/2018   K 4.3 02/23/2018   CL 110 02/23/2018   CREATININE 0.72 02/23/2018   BUN 9 02/23/2018   CO2 24 02/23/2018   TSH 1.62 02/23/2018    Dg Chest 2 View  Result Date: 08/21/2014 CLINICAL DATA:  Pneumonia.  Bronchitis. EXAM: CHEST  2 VIEW COMPARISON:  CT 12/21/2007. FINDINGS: Mediastinum and hilar structures normal. Mild infiltrate right middle lobe cannot be excluded. Heart size normal. No pleural effusion or pneumothorax. No acute bony abnormality. IMPRESSION: Mild infiltrate right middle lobe cannot be excluded. Electronically Signed   By: Marcello Moores  Register   On: 08/21/2014 08:55     Assessment & Plan:  Plan  I have discontinued Josephina Gip. Cervone's Aspirin-Acetaminophen-Caffeine (PAMPRIN MAX PO), lurasidone, and buPROPion. I am also having her maintain her albuterol, betamethasone valerate ointment, levocetirizine, rizatriptan, topiramate, drospirenone-ethinyl estradiol, cetirizine, pantoprazole, and ibuprofen.  No orders of the defined types were placed in this encounter.   Problem List Items Addressed This Visit      Unprioritized   Cyst of ovary - Primary   Relevant Orders   Ambulatory referral to Obstetrics / Gynecology   Testosterone, Total, LC/MS/MS   Flexural eczema   Relevant Orders   Ambulatory referral to Dermatology   Hirsutism    Check labs F/u gyn D/w laser hair removal      Migraine   Relevant Medications   rizatriptan (MAXALT-MLT) 10  MG disintegrating tablet   topiramate (TOPAMAX) 100 MG tablet   ibuprofen (ADVIL,MOTRIN) 800 MG tablet   Other Relevant Orders   Ambulatory referral to Neurology   Testosterone, Total, LC/MS/MS   Migraine without aura    Stable Refer to neuro for f/u      Relevant Medications   rizatriptan (MAXALT-MLT) 10 MG disintegrating tablet   topiramate (TOPAMAX) 100 MG tablet   ibuprofen (ADVIL,MOTRIN) 800 MG tablet   MORBID OBESITY    con't diet and exercise Refer to healthy weight and wellness      Relevant Orders   Amb Ref to Medical Weight Management    Other Visit Diagnoses    Irregular periods       Relevant Orders   Ambulatory referral to Obstetrics / Gynecology   CBC with Differential/Platelet (Completed)   Lipid panel (Completed)   TSH (Completed)   Comprehensive metabolic panel (Completed)   Testosterone, Total, LC/MS/MS   Elevated liver enzymes  Relevant Orders   CBC with Differential/Platelet (Completed)   Lipid panel (Completed)   TSH (Completed)   Comprehensive metabolic panel (Completed)   Chronic pain of right knee       Relevant Orders   Ambulatory referral to Sports Medicine      Follow-up: Return if symptoms worsen or fail to improve, for annual exam, fasting.  Ann Held, DO

## 2018-02-23 NOTE — Assessment & Plan Note (Signed)
Stable Refer to neuro for f/u

## 2018-02-23 NOTE — Patient Instructions (Signed)
Ovarian Cyst An ovarian cyst is a fluid-filled sac that forms on an ovary. The ovaries are small organs that produce eggs in women. Various types of cysts can form on the ovaries. Some may cause symptoms and require treatment. Most ovarian cysts go away on their own, are not cancerous (are benign), and do not cause problems. Common types of ovarian cysts include:  Functional (follicle) cysts. ? Occur during the menstrual cycle, and usually go away with the next menstrual cycle if you do not get pregnant. ? Usually cause no symptoms.  Endometriomas. ? Are cysts that form from the tissue that lines the uterus (endometrium). ? Are sometimes called "chocolate cysts" because they become filled with blood that turns brown. ? Can cause pain in the lower abdomen during intercourse and during your period.  Cystadenoma cysts. ? Develop from cells on the outside surface of the ovary. ? Can get very large and cause lower abdomen pain and pain with intercourse. ? Can cause severe pain if they twist or break open (rupture).  Dermoid cysts. ? Are sometimes found in both ovaries. ? May contain different kinds of body tissue, such as skin, teeth, hair, or cartilage. ? Usually do not cause symptoms unless they get very big.  Theca lutein cysts. ? Occur when too much of a certain hormone (human chorionic gonadotropin) is produced and overstimulates the ovaries to produce an egg. ? Are most common after having procedures used to assist with the conception of a baby (in vitro fertilization).  What are the causes? Ovarian cysts may be caused by:  Ovarian hyperstimulation syndrome. This is a condition that can develop from taking fertility medicines. It causes multiple large ovarian cysts to form.  Polycystic ovarian syndrome (PCOS). This is a common hormonal disorder that can cause ovarian cysts, as well as problems with your period or fertility.  What increases the risk? The following factors may make  you more likely to develop ovarian cysts:  Being overweight or obese.  Taking fertility medicines.  Taking certain forms of hormonal birth control.  Smoking.  What are the signs or symptoms? Many ovarian cysts do not cause symptoms. If symptoms are present, they may include:  Pelvic pain or pressure.  Pain in the lower abdomen.  Pain during sex.  Abdominal swelling.  Abnormal menstrual periods.  Increasing pain with menstrual periods.  How is this diagnosed? These cysts are commonly found during a routine pelvic exam. You may have tests to find out more about the cyst, such as:  Ultrasound.  X-ray of the pelvis.  CT scan.  MRI.  Blood tests.  How is this treated? Many ovarian cysts go away on their own without treatment. Your health care provider may want to check your cyst regularly for 2-3 months to see if it changes. If you are in menopause, it is especially important to have your cyst monitored closely because menopausal women have a higher rate of ovarian cancer. When treatment is needed, it may include:  Medicines to help relieve pain.  A procedure to drain the cyst (aspiration).  Surgery to remove the whole cyst.  Hormone treatment or birth control pills. These methods are sometimes used to help dissolve a cyst.  Follow these instructions at home:  Take over-the-counter and prescription medicines only as told by your health care provider.  Do not drive or use heavy machinery while taking prescription pain medicine.  Get regular pelvic exams and Pap tests as often as told by your health care   provider.  Return to your normal activities as told by your health care provider. Ask your health care provider what activities are safe for you.  Do not use any products that contain nicotine or tobacco, such as cigarettes and e-cigarettes. If you need help quitting, ask your health care provider.  Keep all follow-up visits as told by your health care provider.  This is important. Contact a health care provider if:  Your periods are late, irregular, or painful, or they stop.  You have pelvic pain that does not go away.  You have pressure on your bladder or trouble emptying your bladder completely.  You have pain during sex.  You have any of the following in your abdomen: ? A feeling of fullness. ? Pressure. ? Discomfort. ? Pain that does not go away. ? Swelling.  You feel generally ill.  You become constipated.  You lose your appetite.  You develop severe acne.  You start to have more body hair and facial hair.  You are gaining weight or losing weight without changing your exercise and eating habits.  You think you may be pregnant. Get help right away if:  You have abdominal pain that is severe or gets worse.  You cannot eat or drink without vomiting.  You suddenly develop a fever.  Your menstrual period is much heavier than usual. This information is not intended to replace advice given to you by your health care provider. Make sure you discuss any questions you have with your health care provider. Document Released: 06/07/2005 Document Revised: 12/26/2015 Document Reviewed: 11/09/2015 Elsevier Interactive Patient Education  2018 [employer]  

## 2018-02-23 NOTE — Assessment & Plan Note (Signed)
Check labs F/u gyn D/w laser hair removal

## 2018-02-23 NOTE — Assessment & Plan Note (Signed)
con't diet and exercise Refer to healthy weight and wellness

## 2018-02-26 LAB — TESTOSTERONE, TOTAL, LC/MS/MS: Testosterone, Total, LC-MS-MS: 57 ng/dL — ABNORMAL HIGH (ref 2–45)

## 2018-03-01 ENCOUNTER — Other Ambulatory Visit: Payer: Self-pay | Admitting: Family Medicine

## 2018-03-01 DIAGNOSIS — N83209 Unspecified ovarian cyst, unspecified side: Secondary | ICD-10-CM

## 2018-03-01 DIAGNOSIS — R7989 Other specified abnormal findings of blood chemistry: Secondary | ICD-10-CM

## 2018-03-03 ENCOUNTER — Other Ambulatory Visit: Payer: Self-pay | Admitting: *Deleted

## 2018-03-03 DIAGNOSIS — R7989 Other specified abnormal findings of blood chemistry: Secondary | ICD-10-CM

## 2018-03-03 NOTE — Progress Notes (Signed)
Us/

## 2018-03-06 ENCOUNTER — Ambulatory Visit: Payer: Medicare Other | Admitting: Family Medicine

## 2018-03-29 ENCOUNTER — Encounter: Payer: Self-pay | Admitting: Obstetrics & Gynecology

## 2018-03-29 ENCOUNTER — Other Ambulatory Visit (HOSPITAL_COMMUNITY)
Admission: RE | Admit: 2018-03-29 | Discharge: 2018-03-29 | Disposition: A | Discharge status: Home / Self Care | Payer: Medicare Other | Source: Ambulatory Visit | Attending: Obstetrics & Gynecology | Admitting: Obstetrics & Gynecology

## 2018-03-29 ENCOUNTER — Ambulatory Visit (INDEPENDENT_AMBULATORY_CARE_PROVIDER_SITE_OTHER): Payer: Medicare Other | Admitting: Obstetrics & Gynecology

## 2018-03-29 VITALS — BP 121/71 | HR 94 | Ht 60.0 in | Wt 226.1 lb

## 2018-03-29 DIAGNOSIS — N912 Amenorrhea, unspecified: Secondary | ICD-10-CM

## 2018-03-29 DIAGNOSIS — Z01419 Encounter for gynecological examination (general) (routine) without abnormal findings: Secondary | ICD-10-CM | POA: Insufficient documentation

## 2018-03-29 DIAGNOSIS — Z3009 Encounter for other general counseling and advice on contraception: Secondary | ICD-10-CM

## 2018-03-29 DIAGNOSIS — Z8619 Personal history of other infectious and parasitic diseases: Secondary | ICD-10-CM

## 2018-03-29 DIAGNOSIS — Z124 Encounter for screening for malignant neoplasm of cervix: Secondary | ICD-10-CM | POA: Diagnosis not present

## 2018-03-29 DIAGNOSIS — Z793 Long term (current) use of hormonal contraceptives: Secondary | ICD-10-CM

## 2018-03-29 MED ORDER — DROSPIRENONE-ETHINYL ESTRADIOL 3-0.03 MG PO TABS: 1.0000 | ORAL_TABLET | Freq: Every day | ORAL | 12 refills | Status: DC

## 2018-03-29 NOTE — Progress Notes (Signed)
Subjective:     Gina Cameron is a 32 y.o. female here for a routine exam.  G0 Current complaints: none. Pt with a h/o AUB and h/o cysts on her ovaries and on her cervix. Pt has been on OPCs and had pill amenorrhea. Pt was followed by Dr. Sebastian Ache in [place], a [facility].  Pt is still on OCPs,Syad, and has been on the pills since age 24 years.   Pt was last sexually active Sept 2019.   Pt reports h/o chlamydia x2.        Gynecologic History Patient's last menstrual period was 03/17/2018. Contraception: OCP (estrogen/progesterone) generic [facility] Last Pap: 04/17/2015 . Results were: normal Last mammogram: n/a.   Obstetric History OB History  Gravida Para Term Preterm AB Living  0 0 0 0 0 0  SAB TAB Ectopic Multiple Live Births  0 0 0 0 0   The following portions of the patient's history were reviewed and updated as appropriate: allergies, current medications, past family history, past medical history, past social history, past surgical history and problem list.  Review of Systems Pertinent items are noted in HPI.    Objective:  BP 121/71   Pulse 94   Ht 5' (1.524 m)   Wt 226 lb 1.3 oz (102.5 kg)   LMP 03/17/2018   BMI 44.15 kg/m   General Appearance:    Alert, cooperative, no distress, appears stated age  Head:    Normocephalic, without obvious abnormality, atraumatic  Eyes:    conjunctiva/corneas clear, EOM's intact, both eyes  Ears:    Normal external ear canals, both ears  Nose:   Nares normal, septum midline, mucosa normal, no drainage    or sinus tenderness  Throat:   Lips, mucosa, and tongue normal; teeth and gums normal  Neck:   Supple, symmetrical, trachea midline, no adenopathy;    thyroid:  no enlargement/tenderness/nodules  Back:     Symmetric, no curvature, ROM normal, no CVA tenderness  Lungs:     Clear to auscultation bilaterally, respirations unlabored  Chest Wall:    No tenderness or deformity   Heart:    Regular rate and rhythm, S1 and S2 normal,  no murmur, rub   or gallop  Breast Exam:    No tenderness, masses, or nipple abnormality  Abdomen:     Soft, non-tender, bowel sounds active all four quadrants,    no masses, no organomegaly  Genitalia:    Normal female without lesion, discharge or tenderness     Extremities:   Extremities normal, atraumatic, no cyanosis or edema  Pulses:   2+ and symmetric all extremities  Skin:   Skin color, texture, turgor normal, no rashes or lesions; tattoo on back left side.       [place] at [facility] from care Everywhere: 02/15/2018 Endovaginal view:   The anteverted uterus and left ovary appear normal.The endometrial  stripe measures 4.9 mm.   A simple right ovarian cyst measures 2.8 x 3.7 x 3.9 cm. A CT scan done 11/30/2017 reported a 3.5 cm simple right ovarian cyst.   No free fluid is seen in the pelvis. Assessment:    Healthy female exam.   Pill amenorrhea H/o STIs   Plan:    Contraception: OCP (estrogen/progesterone). Follow up in: 1 year.    Need records from Dr. Naida Sleight at [facility] D/w pt LnIUD F/u PAP and cx  Hoyle Sauer L. Harraway-Smith, M.D., Cherlynn June

## 2018-03-29 NOTE — Patient Instructions (Signed)

## 2018-04-04 ENCOUNTER — Encounter: Payer: Self-pay | Admitting: Family Medicine

## 2018-04-04 LAB — CYTOLOGY - PAP
Chlamydia: NEGATIVE
Diagnosis: NEGATIVE
HPV: NOT DETECTED
Neisseria Gonorrhea: NEGATIVE

## 2018-04-05 NOTE — Progress Notes (Deleted)
NEUROLOGY CONSULTATION NOTE  Gina Cameron MRN: [id number] DOB: [birth year]  Referring provider: Roma Schanz, DO Primary care provider: Roma Schanz, DO  Reason for consult:  migraine  HISTORY OF PRESENT ILLNESS: Gina Cameron is a 32 year old ***-handed female with migraines, Sturge-Weber syndrome, depression/anxiety and history of *** seizures who presents for migraines.  History supplemented by referring providers note.  Onset:  *** Location:  *** Quality:  *** Intensity:  ***.  *** denies new headache, thunderclap headache or severe headache that wakes *** from sleep. Aura:  *** Prodrome:  *** Postdrome:  *** Associated symptoms:  ***.  *** denies associated unilateral numbness or weakness. Duration:  *** Frequency:  *** Frequency of abortive medication: *** Triggers:  *** Exacerbating factors:  *** Relieving factors:  *** Activity:  ***  Current NSAIDS:  Ibuprofen 800mg  Current analgesics:  *** Current triptans:  Maxalt MLT 10mg  Current ergotamine:  *** Current anti-emetic:  *** Current muscle relaxants:  *** Current anti-anxiolytic:  *** Current sleep aide:  *** Current Antihypertensive medications:  *** Current Antidepressant medications:  *** Current Anticonvulsant medications:  topiramate 100mg  twice daily Current anti-CGRP:  *** Current Vitamins/Herbal/Supplements:  *** Current Antihistamines/Decongestants:  *** Other therapy:  *** Hormone/birth control:  Yasmin  Past NSAIDS:  Naproxen 500mg  Past analgesics:  Excedrin Migraine Past abortive triptans:  *** Past abortive ergotamine:  *** Past muscle relaxants:  *** Past anti-emetic:  Reglan 10mg , Zofran ODT 8mg  Past antihypertensive medications:  *** Past antidepressant medications:  Wellbutrin, Celexa Past anticonvulsant medications:  *** Past anti-CGRP:  *** Past vitamins/Herbal/Supplements:  *** Past antihistamines/decongestants:  *** Other past therapies:  ***  Caffeine:   *** Alcohol:  *** Smoker:  *** Diet:  *** Exercise:  *** Depression:  ***; Anxiety:  *** Other pain:  *** Sleep hygiene:  *** Family history of headache:  ***  MRI of brain without contrast from 06/12/09 personally reviewed and demonstrated findings of Sturge-Weber such as atrophy of left parietal and occipital lobes, gyriform calcification, asymmetric enlargement of left choroid plexus, pia mater vascular malformation and hyperostosis of skull.  02/23/18 CMP:  Na 140, K 4.3, Cl 110, CO2 24, glucose 82, BUN 9, Cr 0.72, t bili 0.2, ALP 56, AST 16, ALT 24.  PAST MEDICAL HISTORY: Past Medical History:  Diagnosis Date  . Anxiety   . Depression   . GERD (gastroesophageal reflux disease)   . Hives   . Migraine   . Reflux   . Seizures ([place])   . Sturge-Weber syndrome ([place])     PAST SURGICAL HISTORY: Past Surgical History:  Procedure Laterality Date  . CHOLECYSTECTOMY      MEDICATIONS: Current Outpatient Medications on File Prior to Visit  Medication Sig Dispense Refill  . albuterol (PROVENTIL HFA;VENTOLIN HFA) 108 (90 Base) MCG/ACT inhaler Inhale 2 puffs into the lungs every 4 (four) hours.    . betamethasone valerate ointment (VALISONE) 0.1 % Apply 1 application topically 2 (two) times daily.    . cetirizine (ZYRTEC) 10 MG tablet Take 1 tablet by mouth daily.    . drospirenone-ethinyl estradiol (YASMIN,ZARAH,SYEDA) 3-0.03 MG tablet Take 1 tablet by mouth daily. 1 Package 12  . ibuprofen (ADVIL,MOTRIN) 800 MG tablet Take 1 tablet by mouth every 6 (six) hours as needed for pain.    Marland Kitchen levocetirizine (XYZAL) 5 MG tablet Take 1 tablet by mouth every morning.    . Multiple Vitamin (MULTIVITAMINS PO) Take by mouth.    . pantoprazole (PROTONIX) 40 MG tablet  Take 1 tablet by mouth daily.    . rizatriptan (MAXALT-MLT) 10 MG disintegrating tablet Take 1 tablet by mouth as needed.    . topiramate (TOPAMAX) 100 MG tablet Take 1 tablet by mouth 2 (two) times daily.     No current  facility-administered medications on file prior to visit.     ALLERGIES: Allergies  Allergen Reactions  . Lidocaine-Epinephrine Shortness Of Breath    REACTION: wheezing  . Mepivacaine Hcl Shortness Of Breath    REACTION: wheezing  . Diphenhydramine Hcl Swelling  . Penicillins Other (See Comments)    Triggers seizures and coma  . Latex Rash    FAMILY HISTORY: Family History  Problem Relation Age of Onset  . Hypertension Father   . Hyperlipidemia Father   . Hypertension Maternal Grandmother   . Diabetes Maternal Grandmother   . Hyperlipidemia Maternal Grandmother    ***.  SOCIAL HISTORY: Social History   Socioeconomic History  . Marital status: Single    Spouse name: Not on file  . Number of children: Not on file  . Years of education: Not on file  . Highest education level: Not on file  Occupational History  . Not on file  Social Needs  . Financial resource strain: Not on file  . Food insecurity:    Worry: Not on file    Inability: Not on file  . Transportation needs:    Medical: Not on file    Non-medical: Not on file  Tobacco Use  . Smoking status: Former Smoker    Packs/day: 0.10    Years: 2.00    Pack years: 0.20    Types: Cigarettes    Last attempt to quit: 09/06/2012    Years since quitting: 5.5  . Smokeless tobacco: Never Used  Substance and Sexual Activity  . Alcohol use: Yes    Comment: Socially  . Drug use: Yes    Types: Marijuana  . Sexual activity: Yes    Birth control/protection: Pill  Lifestyle  . Physical activity:    Days per week: Not on file    Minutes per session: Not on file  . Stress: Not on file  Relationships  . Social connections:    Talks on phone: Not on file    Gets together: Not on file    Attends religious service: Not on file    Active member of club or organization: Not on file    Attends meetings of clubs or organizations: Not on file    Relationship status: Not on file  . Intimate partner violence:    Fear of  current or ex partner: Not on file    Emotionally abused: Not on file    Physically abused: Not on file    Forced sexual activity: Not on file  Other Topics Concern  . Not on file  Social History Narrative  . Not on file    REVIEW OF SYSTEMS: Constitutional: No fevers, chills, or sweats, no generalized fatigue, change in appetite Eyes: No visual changes, double vision, eye pain Ear, nose and throat: No hearing loss, ear pain, nasal congestion, sore throat Cardiovascular: No chest pain, palpitations Respiratory:  No shortness of breath at rest or with exertion, wheezes GastrointestinaI: No nausea, vomiting, diarrhea, abdominal pain, fecal incontinence Genitourinary:  No dysuria, urinary retention or frequency Musculoskeletal:  No neck pain, back pain Integumentary: No rash, pruritus, skin lesions Neurological: as above Psychiatric: No depression, insomnia, anxiety Endocrine: No palpitations, fatigue, diaphoresis, mood swings, change in appetite,  change in weight, increased thirst Hematologic/Lymphatic:  No purpura, petechiae. Allergic/Immunologic: no itchy/runny eyes, nasal congestion, recent allergic reactions, rashes  PHYSICAL EXAM: *** General: No acute distress.  Patient appears ***-groomed.  *** Head:  Normocephalic/atraumatic Eyes:  fundi examined but not visualized Neck: supple, no paraspinal tenderness, full range of motion Back: No paraspinal tenderness Heart: regular rate and rhythm Lungs: Clear to auscultation bilaterally. Vascular: No carotid bruits. Neurological Exam: Mental status: alert and oriented to person, place, and time, recent and remote memory intact, fund of knowledge intact, attention and concentration intact, speech fluent and not dysarthric, language intact. Cranial nerves: CN I: not tested CN II: pupils equal, round and reactive to light, visual fields intact CN III, IV, VI:  full range of motion, no nystagmus, no ptosis CN V: facial sensation  intact CN VII: upper and lower face symmetric CN VIII: hearing intact CN IX, X: gag intact, uvula midline CN XI: sternocleidomastoid and trapezius muscles intact CN XII: tongue midline Bulk & Tone: normal, no fasciculations. Motor:  5/5 throughout *** Sensation:  [place] *** temperature *** and vibration sensation intact.  ***. Deep Tendon Reflexes:  2+ throughout, *** toes downgoing.  *** Finger to nose testing:  Without dysmetria.  *** Heel to shin:  Without dysmetria.  *** Gait:  Normal station and stride.  Able to turn and tandem walk. [place] ***.  IMPRESSION: ***  PLAN: ***  Thank you for allowing me to take part in the care of this patient.  Metta Clines, DO  CC: ***

## 2018-04-06 ENCOUNTER — Ambulatory Visit: Payer: Self-pay | Admitting: Neurology

## 2018-04-10 ENCOUNTER — Encounter: Payer: Self-pay | Admitting: Family Medicine

## 2018-04-10 ENCOUNTER — Telehealth: Payer: Self-pay | Admitting: Family Medicine

## 2018-04-10 ENCOUNTER — Ambulatory Visit (INDEPENDENT_AMBULATORY_CARE_PROVIDER_SITE_OTHER): Payer: Medicare Other | Admitting: Family Medicine

## 2018-04-10 ENCOUNTER — Other Ambulatory Visit: Payer: Self-pay | Admitting: Family Medicine

## 2018-04-10 ENCOUNTER — Ambulatory Visit (HOSPITAL_BASED_OUTPATIENT_CLINIC_OR_DEPARTMENT_OTHER)
Admission: RE | Admit: 2018-04-10 | Discharge: 2018-04-10 | Disposition: A | Discharge status: Home / Self Care | Payer: Medicare Other | Source: Ambulatory Visit | Attending: Family Medicine | Admitting: Family Medicine

## 2018-04-10 VITALS — BP 116/70 | HR 89 | Temp 98.5°F | Resp 18 | Wt 226.2 lb

## 2018-04-10 DIAGNOSIS — M79671 Pain in right foot: Secondary | ICD-10-CM

## 2018-04-10 DIAGNOSIS — M25561 Pain in right knee: Secondary | ICD-10-CM | POA: Insufficient documentation

## 2018-04-10 DIAGNOSIS — K219 Gastro-esophageal reflux disease without esophagitis: Secondary | ICD-10-CM | POA: Diagnosis not present

## 2018-04-10 DIAGNOSIS — M1711 Unilateral primary osteoarthritis, right knee: Secondary | ICD-10-CM | POA: Diagnosis not present

## 2018-04-10 DIAGNOSIS — Z23 Encounter for immunization: Secondary | ICD-10-CM

## 2018-04-10 DIAGNOSIS — M2391 Unspecified internal derangement of right knee: Secondary | ICD-10-CM | POA: Insufficient documentation

## 2018-04-10 DIAGNOSIS — L2082 Flexural eczema: Secondary | ICD-10-CM

## 2018-04-10 DIAGNOSIS — Q858 Other phakomatoses, not elsewhere classified: Secondary | ICD-10-CM

## 2018-04-10 DIAGNOSIS — Q8589 Other phakomatoses, not elsewhere classified: Secondary | ICD-10-CM

## 2018-04-10 DIAGNOSIS — G8929 Other chronic pain: Secondary | ICD-10-CM

## 2018-04-10 DIAGNOSIS — M7989 Other specified soft tissue disorders: Secondary | ICD-10-CM | POA: Diagnosis not present

## 2018-04-10 MED ORDER — OMEPRAZOLE 20 MG PO CPDR: 20.0000 mg | DELAYED_RELEASE_CAPSULE | Freq: Every day | ORAL | 3 refills | Status: DC

## 2018-04-10 NOTE — Telephone Encounter (Signed)
Copied from [place] [id number]. Topic: Quick Communication - See Telephone Encounter >> Apr 10, 2018  1:21 PM Blase Mess A wrote: CRM for notification. See Telephone encounter for: 04/10/18. Patient is calling to request a refferal for dermatogy Patient does not want to go [facility]. Please advise thanks.

## 2018-04-10 NOTE — Patient Instructions (Signed)
Foot Pain Many things can cause foot pain. Some common causes are:  An injury.  A sprain.  Arthritis.  Blisters.  Bunions.  Follow these instructions at home: Pay attention to any changes in your symptoms. Take these actions to help with your discomfort:  If directed, put ice on the affected area: ? Put ice in a plastic bag. ? Place a towel between your skin and the bag. ? Leave the ice on for 15-20 minutes, 3?4 times a day for 2 days.  Take over-the-counter and prescription medicines only as told by your health care provider.  Wear comfortable, supportive shoes that fit you well. Do not wear high heels.  Do not stand or walk for long periods of time.  Do not lift a lot of weight. This can put added pressure on your feet.  Do stretches to relieve foot pain and stiffness as told by your health care provider.  Rub your foot gently.  Keep your feet clean and dry.  Contact a health care provider if:  Your pain does not get better after a few days of self-care.  Your pain gets worse.  You cannot stand on your foot. Get help right away if:  Your foot is numb or tingling.  Your foot or toes are swollen.  Your foot or toes turn white or blue.  You have warmth and redness along your foot. This information is not intended to replace advice given to you by your health care provider. Make sure you discuss any questions you have with your health care provider. Document Released: 07/04/2015 Document Revised: 11/13/2015 Document Reviewed: 07/03/2014 Elsevier Interactive Patient Education  2018 [employer]vier Inc.  

## 2018-04-10 NOTE — Assessment & Plan Note (Signed)
Degenerative changes on xray Refer to sport med
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
## 2018-04-10 NOTE — Telephone Encounter (Signed)
I put it in

## 2018-04-10 NOTE — Assessment & Plan Note (Signed)
Forms filled out for scat transportation

## 2018-04-10 NOTE — Assessment & Plan Note (Signed)
Xray normal Refer to sport med

## 2018-04-10 NOTE — Telephone Encounter (Signed)
Are you ok with this and associate with what diagnosis?

## 2018-04-11 NOTE — Telephone Encounter (Signed)
Left detailed message on machine that referral was placed.

## 2018-04-13 ENCOUNTER — Emergency Department (HOSPITAL_BASED_OUTPATIENT_CLINIC_OR_DEPARTMENT_OTHER)
Admission: EM | Admit: 2018-04-13 | Discharge: 2018-04-13 | Disposition: A | Discharge status: Home / Self Care | Payer: Medicare Other | Attending: Emergency Medicine | Admitting: Emergency Medicine

## 2018-04-13 ENCOUNTER — Encounter (HOSPITAL_BASED_OUTPATIENT_CLINIC_OR_DEPARTMENT_OTHER): Payer: Self-pay

## 2018-04-13 ENCOUNTER — Other Ambulatory Visit: Payer: Self-pay

## 2018-04-13 DIAGNOSIS — Z79899 Other long term (current) drug therapy: Secondary | ICD-10-CM | POA: Diagnosis not present

## 2018-04-13 DIAGNOSIS — R519 Headache, unspecified: Secondary | ICD-10-CM

## 2018-04-13 DIAGNOSIS — Z87891 Personal history of nicotine dependence: Secondary | ICD-10-CM | POA: Diagnosis not present

## 2018-04-13 DIAGNOSIS — R51 Headache: Secondary | ICD-10-CM | POA: Insufficient documentation

## 2018-04-13 DIAGNOSIS — Q858 Other phakomatoses, not elsewhere classified: Secondary | ICD-10-CM | POA: Insufficient documentation

## 2018-04-13 DIAGNOSIS — Z91040 Latex allergy status: Secondary | ICD-10-CM | POA: Insufficient documentation

## 2018-04-13 MED ORDER — SODIUM CHLORIDE 0.9 % IV BOLUS
1000.0000 mL | Freq: Once | INTRAVENOUS | Status: AC
  Administered 2018-04-13: 1000 mL via INTRAVENOUS

## 2018-04-13 MED ORDER — TOPIRAMATE 100 MG PO TABS: 100.0000 mg | ORAL_TABLET | Freq: Two times a day (BID) | ORAL | 0 refills | Status: DC

## 2018-04-13 MED ORDER — DEXAMETHASONE SODIUM PHOSPHATE 10 MG/ML IJ SOLN
10.0000 mg | Freq: Once | INTRAMUSCULAR | Status: AC
  Administered 2018-04-13: 10 mg via INTRAVENOUS
  Filled 2018-04-13: qty 1

## 2018-04-13 MED ORDER — METOCLOPRAMIDE HCL 5 MG/ML IJ SOLN
10.0000 mg | Freq: Once | INTRAMUSCULAR | Status: AC
  Administered 2018-04-13: 10 mg via INTRAVENOUS
  Filled 2018-04-13: qty 2

## 2018-04-13 MED FILL — TOPIRAMATE 100 MG TABLET: 100 | 14 days supply | Qty: 28 | Fill #0

## 2018-04-13 NOTE — Discharge Instructions (Signed)
Please read and follow all provided instructions.  Your diagnoses today include:  1. Bad headache     Tests performed today include:  Vital signs. See below for your results today.   Medications:  In the Emergency Department you received:  Reglan - antinausea/headache medication  Decadron - steroid medication for headache  Take any prescribed medications only as directed.  Additional information:  Follow any educational materials contained in this packet.  You are having a headache. No specific cause was found today for your headache. It may have been a migraine or other cause of headache. Stress, anxiety, fatigue, and depression are common triggers for headaches.   Your headache today does not appear to be life-threatening or require hospitalization, but often the exact cause of headaches is not determined in the emergency department. Therefore, follow-up with your doctor is very important to find out what may have caused your headache and whether or not you need any further diagnostic testing or treatment.   Sometimes headaches can appear benign (not harmful), but then more serious symptoms can develop which should prompt an immediate re-evaluation by your doctor or the emergency department.  BE VERY CAREFUL not to take multiple medicines containing Tylenol (also called acetaminophen). Doing so can lead to an overdose which can damage your liver and cause liver failure and possibly death.   Follow-up instructions: Please follow-up with your primary care provider in the next 3 days for further evaluation of your symptoms.   Return instructions:   Please return to the Emergency Department if you experience worsening symptoms.  Return if the medications do not resolve your headache, if it recurs, or if you have multiple episodes of vomiting or cannot keep down fluids.  Return if you have a change from the usual headache.  RETURN IMMEDIATELY IF you:  Develop a sudden, severe  headache  Develop confusion or become poorly responsive or faint  Develop a fever above 100.12F or problem breathing  Have a change in speech, vision, swallowing, or understanding  Develop new weakness, numbness, tingling, incoordination in your arms or legs  Have a seizure  Please return if you have any other emergent concerns.  Additional Information:  Your vital signs today were: BP 119/82 (BP Location: Left Arm)    Pulse 79    Temp 98.9 F (37.2 C) (Oral)    Resp 18    Ht 5\' 2"  (1.575 m)    Wt 101.2 kg    LMP 03/17/2018    SpO2 98%    BMI 40.79 kg/m  If your blood pressure (BP) was elevated above 135/85 this visit, please have this repeated by your doctor within one month. --------------

## 2018-04-13 NOTE — ED Triage Notes (Addendum)
C/o migraine x 4 days--NAD-steady gait-when asked what her PCP advised during office visit 3 days prior pt states she was advised to take excedrin and come to ED prn

## 2018-04-13 NOTE — ED Provider Notes (Signed)
[facility] EMERGENCY DEPARTMENT Provider Note   CSN: [id number] Arrival date & time: 04/13/18  1121     History   Chief Complaint Chief Complaint  Patient presents with  . Migraine    HPI Gina Cameron is a 32 y.o. female.  Patient with history of migraines presents the emergency department with persistent headache over the past 3 days.  Patient states that she was previously on Topamax but she has run out over the past week.  Since she has been off of this, she has developed frontal headache with pain that radiates down into her left face and into her left teeth.  She denies any facial or dental swelling.  She states that this radiation is is not unusual for her headaches.  She has associated light and sound sensitivity.  No fevers or confusion.  No neck pain.  No nausea, vomiting, chest or abdominal pain.  The onset of this condition was acute. The course is constant. Aggravating factors: none. Alleviating factors: none.       Past Medical History:  Diagnosis Date  . Anxiety   . Depression   . GERD (gastroesophageal reflux disease)   . Hives   . Migraine   . Reflux   . Seizures ([place])   . 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)     Patient Active Problem List   Diagnosis Date Noted  . Acute pain of right knee 04/10/2018  . Foot pain, right 04/10/2018  . Developmental delay 02/23/2018  . Seizures ([place]) 02/23/2018  . Hirsutism 02/23/2018  . Cyst of ovary 02/23/2018  . Flexural eczema 02/23/2018  . Abnormal uterine bleeding 06/29/2016  . History of sleep apnea 06/29/2016  . Complex tear of medial meniscus of right knee as current injury 03/01/2016  . 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 History of brain disorder 08/06/2014  . History of seizure 08/06/2014  . Migraine   . Vaginal discharge 01/12/2013  . Abdominal pain, generalized 01/12/2013  . Conjunctivitis 01/12/2013  . GERD 01/14/2010  . OBSTRUCTIVE SLEEP APNEA 10/30/2009  . SNORING 10/15/2009    . Migraine without aura 06/10/2009  . ECZEMA 03/05/2009  . SINUSITIS - ACUTE-NOS 10/30/2008  . DEPRESSION 07/24/2008  . HEADACHE 07/24/2008  . CHLAMYDIAL INFECTION 01/15/2008  . MILD MENTAL RETARDATION 01/15/2008  . 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 MORBID OBESITY 01/10/2008  . URTICARIA 01/10/2008  . ABDOMINAL PAIN 02/22/2007  . PHARYNGITIS, ACUTE 11/30/2006  . DERMATITIS, OTHER ATOPIC 11/30/2006  . BRONCHITIS NOS 09/26/2006    Past Surgical History:  Procedure Laterality Date  . CHOLECYSTECTOMY       OB History    Gravida  0   Para  0   Term  0   Preterm  0   AB  0   Living  0     SAB  0   TAB  0   Ectopic  0   Multiple  0   Live Births  0            Home Medications    Prior to Admission medications   Medication Sig Start Date End Date Taking? Authorizing Provider  albuterol (PROVENTIL HFA;VENTOLIN HFA) 108 (90 Base) MCG/ACT inhaler Inhale 2 puffs into the lungs every 4 (four) hours. 08/20/14   [provider]  betamethasone valerate ointment (VALISONE) 0.1 % Apply 1 application topically 2 (two) times daily.    [provider]  cetirizine (ZYRTEC) 10 MG  tablet Take 1 tablet by mouth daily.    [provider]  drospirenone-ethinyl estradiol (YASMIN,ZARAH,SYEDA) 3-0.03 MG tablet Take 1 tablet by mouth daily. 03/29/18 03/29/19  Lavonia Drafts, MD  ibuprofen (ADVIL,MOTRIN) 800 MG tablet Take 1 tablet by mouth every 6 (six) hours as needed for pain. 07/09/17   [provider]  levocetirizine (XYZAL) 5 MG tablet Take 1 tablet by mouth every morning.    [provider]  Multiple Vitamin (MULTIVITAMINS PO) Take by mouth.    [provider]  omeprazole (PRILOSEC) 20 MG capsule Take 1 capsule (20 mg total) by mouth daily. 04/10/18   Roma Schanz R, DO  rizatriptan (MAXALT-MLT) 10 MG disintegrating tablet Take 1  tablet by mouth as needed. 08/10/17   [provider]  topiramate (TOPAMAX) 100 MG tablet Take 1 tablet by mouth 2 (two) times daily. 12/28/17   [provider]    Family History Family History  Problem Relation Age of Onset  . Hypertension Father   . Hyperlipidemia Father   . Hypertension Maternal Grandmother   . Diabetes Maternal Grandmother   . Hyperlipidemia Maternal Grandmother     Social History Social History   Tobacco Use  . Smoking status: Former Smoker    Packs/day: 0.10    Years: 2.00    Pack years: 0.20    Types: Cigarettes    Last attempt to quit: 09/06/2012    Years since quitting: 5.6  . Smokeless tobacco: Never Used  Substance Use Topics  . Alcohol use: Yes    Comment: Socially  . Drug use: Yes    Types: Marijuana     Allergies   Lidocaine-epinephrine; Mepivacaine hcl; Diphenhydramine hcl; Penicillins; and Latex   Review of Systems Review of Systems  Constitutional: Negative for fever.  HENT: Negative for congestion, dental problem, rhinorrhea and sinus pressure.   Eyes: Positive for photophobia. Negative for discharge, redness and visual disturbance.  Respiratory: Negative for shortness of breath.   Cardiovascular: Negative for chest pain.  Gastrointestinal: Negative for nausea and vomiting.  Musculoskeletal: Negative for gait problem, neck pain and neck stiffness.  Skin: Negative for rash.  Neurological: Positive for headaches. Negative for syncope, speech difficulty, weakness, light-headedness and numbness.  Psychiatric/Behavioral: Negative for confusion.     Physical Exam Updated Vital Signs BP 133/81 (BP Location: Left Arm)   Pulse 85   Temp 98.9 F (37.2 C) (Oral)   Resp 18   Ht 5\' 2"  (1.575 m)   Wt 101.2 kg   LMP 03/17/2018   SpO2 100%   BMI 40.79 kg/m   Physical Exam  Constitutional: She is oriented to person, place, and time. She appears well-developed and well-nourished.  HENT:  Head: Normocephalic and  atraumatic.  Right Ear: Tympanic membrane, external ear and ear canal normal.  Left Ear: Tympanic membrane, external ear and ear canal normal.  Nose: Nose normal.  Mouth/Throat: Uvula is midline, oropharynx is clear and moist and mucous membranes are normal.  Eyes: Pupils are equal, round, and reactive to light. Conjunctivae, EOM and lids are normal. Right eye exhibits no nystagmus. Left eye exhibits no nystagmus.  Neck: Normal range of motion. Neck supple.  Cardiovascular: Normal rate and regular rhythm.  Pulmonary/Chest: Effort normal and breath sounds normal.  Abdominal: Soft. There is no tenderness.  Musculoskeletal:       Cervical back: She exhibits normal range of motion, no tenderness and no bony tenderness.  Neurological: She is alert and oriented to person, place, and  time. She has normal strength and normal reflexes. No cranial nerve deficit or sensory deficit. She displays a negative Romberg sign. Coordination and gait normal. GCS eye subscore is 4. GCS verbal subscore is 5. GCS motor subscore is 6.  Skin: Skin is warm and dry.  Psychiatric: She has a normal mood and affect.  Nursing note and vitals reviewed.    ED Treatments / Results  Labs (all labs ordered are listed, but only abnormal results are displayed) Labs Reviewed - No data to display  EKG None  Radiology No results found.  Procedures Procedures (including critical care time)  Medications Ordered in ED Medications  sodium chloride 0.9 % bolus 1,000 mL ( Intravenous Stopped 04/13/18 1413)  dexamethasone (DECADRON) injection 10 mg (10 mg Intravenous Given 04/13/18 1313)  metoCLOPramide (REGLAN) injection 10 mg (10 mg Intravenous Given 04/13/18 1315)     Initial Impression / Assessment and Plan / ED Course  I have reviewed the triage vital signs and the nursing notes.  Pertinent labs & imaging results that were available during my care of the patient were reviewed by me and considered in my medical  decision making (see chart for details).     Patient seen and examined.  Will give migraine cocktail and reassess.  Patient reports that symptoms are similar to previous headaches.  Vital signs reviewed and are as follows: BP 133/81 (BP Location: Left Arm)   Pulse 85   Temp 98.9 F (37.2 C) (Oral)   Resp 18   Ht 5\' 2"  (1.575 m)   Wt 101.2 kg   LMP 03/17/2018   SpO2 100%   BMI 40.79 kg/m   2:50 PM patient feeling much better after Reglan, Decadron, IV fluids.  She will be discharged home.  I will give her a 2-week course of her prophylactic Topamax.  She is encouraged to follow-up with her doctor to have this refilled.  Patient counseled to return if they have weakness in their arms or legs, slurred speech, trouble walking or talking, confusion, trouble with their balance, or if they have any other concerns. Patient verbalizes understanding and agrees with plan.    Final Clinical Impressions(s) / ED Diagnoses   Final diagnoses:  Bad headache   Patient without high-risk features of headache including: sudden onset/thunderclap HA, no similar headache in past, altered mental status, accompanying seizure, headache with exertion, age > 80, history of immunocompromise, neck or shoulder pain, fever, use of anticoagulation, family history of spontaneous SAH, concomitant drug use, toxic exposure.   Patient has a normal complete neurological exam, normal vital signs, normal level of consciousness, no signs of meningismus, is well-appearing/non-toxic appearing, no signs of trauma.   Imaging with CT/MRI not indicated given history and physical exam findings.   No dangerous or life-threatening conditions suspected or identified by history, physical exam, and by work-up. No indications for hospitalization identified.    ED Discharge Orders    None       Carlisle Cater, PA-C 04/13/18 1452    Fredia Sorrow, MD 04/14/18 [phone]

## 2018-04-20 ENCOUNTER — Ambulatory Visit (INDEPENDENT_AMBULATORY_CARE_PROVIDER_SITE_OTHER): Payer: Medicare Other | Admitting: Family Medicine

## 2018-04-20 ENCOUNTER — Ambulatory Visit (HOSPITAL_BASED_OUTPATIENT_CLINIC_OR_DEPARTMENT_OTHER)
Admission: RE | Admit: 2018-04-20 | Discharge: 2018-04-20 | Disposition: A | Discharge status: Home / Self Care | Payer: Medicare Other | Source: Ambulatory Visit | Attending: Family Medicine | Admitting: Family Medicine

## 2018-04-20 ENCOUNTER — Encounter: Payer: Self-pay | Admitting: Family Medicine

## 2018-04-20 VITALS — BP 147/68 | HR 93 | Ht 62.0 in | Wt 226.0 lb

## 2018-04-20 DIAGNOSIS — M79671 Pain in right foot: Secondary | ICD-10-CM | POA: Diagnosis not present

## 2018-04-20 DIAGNOSIS — S99921A Unspecified injury of right foot, initial encounter: Secondary | ICD-10-CM | POA: Diagnosis not present

## 2018-04-20 MED ORDER — MELOXICAM 15 MG PO TABS: 15.0000 mg | ORAL_TABLET | Freq: Every day | ORAL | 2 refills | Status: DC

## 2018-04-20 MED FILL — MELOXICAM 15 MG TABLET: 15 | 30 days supply | Qty: 30 | Fill #0

## 2018-04-20 NOTE — Patient Instructions (Addendum)
Your x-rays look great - no evidence of the ligamentous disruption we were worried about. Cam walker when up and walking around to help rest this contusion and sprain. Icing 15 minutes at a time 3-4 times a day. Tylenol 500mg  1-2 tabs three times a day as needed for pain. Meloxicam 15mg  daily with food for pain and inflammation. Follow up with me in 1 month to 6 weeks.  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 Avoid painful activities when possible (often deep squats, lunges bother this). Cross train with swimming, cycling with low resistance, elliptical if needed. Straight leg raise, hip side raises, straight leg raises with foot turned outwards 3 sets of 10 once a day. Add ankle weight if these become too easy. Consider formal physical therapy. Correct foot breakdown with something like dr. Zoe Lan active series, spencos, or our green sports insoles. Avoid flat shoes, barefoot walking as much as possible. Icing 15 minutes at a time 3-4 times a day as needed. Consider knee sleeve for support when up and walking around. Tylenol, meloxicam as noted above.
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
## 2018-04-21 ENCOUNTER — Encounter: Payer: Self-pay | Admitting: Family Medicine

## 2018-04-27 ENCOUNTER — Encounter (INDEPENDENT_AMBULATORY_CARE_PROVIDER_SITE_OTHER): Payer: Self-pay

## 2018-05-10 ENCOUNTER — Ambulatory Visit (INDEPENDENT_AMBULATORY_CARE_PROVIDER_SITE_OTHER): Payer: Medicare Other | Admitting: Family Medicine

## 2018-05-10 ENCOUNTER — Encounter (INDEPENDENT_AMBULATORY_CARE_PROVIDER_SITE_OTHER): Payer: Self-pay | Admitting: Family Medicine

## 2018-05-10 ENCOUNTER — Other Ambulatory Visit (INDEPENDENT_AMBULATORY_CARE_PROVIDER_SITE_OTHER): Payer: Self-pay | Admitting: Family Medicine

## 2018-05-10 VITALS — BP 110/75 | HR 78 | Temp 98.2°F | Ht 60.0 in | Wt 219.0 lb

## 2018-05-10 DIAGNOSIS — E7849 Other hyperlipidemia: Secondary | ICD-10-CM | POA: Diagnosis not present

## 2018-05-10 DIAGNOSIS — G4739 Other sleep apnea: Secondary | ICD-10-CM

## 2018-05-10 DIAGNOSIS — R0602 Shortness of breath: Secondary | ICD-10-CM | POA: Diagnosis not present

## 2018-05-10 DIAGNOSIS — F319 Bipolar disorder, unspecified: Secondary | ICD-10-CM

## 2018-05-10 DIAGNOSIS — R5383 Other fatigue: Secondary | ICD-10-CM

## 2018-05-10 DIAGNOSIS — Z6841 Body Mass Index (BMI) 40.0 and over, adult: Secondary | ICD-10-CM | POA: Diagnosis not present

## 2018-05-10 DIAGNOSIS — E639 Nutritional deficiency, unspecified: Secondary | ICD-10-CM | POA: Diagnosis not present

## 2018-05-10 DIAGNOSIS — R739 Hyperglycemia, unspecified: Secondary | ICD-10-CM | POA: Diagnosis not present

## 2018-05-10 DIAGNOSIS — E559 Vitamin D deficiency, unspecified: Secondary | ICD-10-CM | POA: Diagnosis not present

## 2018-05-10 DIAGNOSIS — Z1331 Encounter for screening for depression: Secondary | ICD-10-CM | POA: Diagnosis not present

## 2018-05-10 DIAGNOSIS — Z0289 Encounter for other administrative examinations: Secondary | ICD-10-CM

## 2018-05-11 LAB — COMPREHENSIVE METABOLIC PANEL
ALBUMIN: 4.1 g/dL (ref 3.5–5.5)
ALK PHOS: 67 IU/L (ref 39–117)
ALT: 20 IU/L (ref 0–32)
AST: 15 IU/L (ref 0–40)
Albumin/Globulin Ratio: 1.3 (ref 1.2–2.2)
BUN / CREAT RATIO: 14 (ref 9–23)
BUN: 12 mg/dL (ref 6–20)
CHLORIDE: 100 mmol/L (ref 96–106)
CO2: 19 mmol/L — ABNORMAL LOW (ref 20–29)
CREATININE: 0.86 mg/dL (ref 0.57–1.00)
Calcium: 10.1 mg/dL (ref 8.7–10.2)
GFR calc non Af Amer: 91 mL/min/{1.73_m2} (ref >59)
GFR, EST AFRICAN AMERICAN: 105 mL/min/{1.73_m2} (ref >59)
GLOBULIN, TOTAL: 3.1 g/dL (ref 1.5–4.5)
GLUCOSE: 79 mg/dL (ref 65–99)
Potassium: 4.4 mmol/L (ref 3.5–5.2)
SODIUM: 135 mmol/L (ref 134–144)
Total Protein: 7.2 g/dL (ref 6.0–8.5)

## 2018-05-11 LAB — LIPID PANEL
CHOL/HDL RATIO: 2 ratio (ref 0.0–4.4)
Cholesterol, Total: 142 mg/dL (ref 100–199)
HDL: 70 mg/dL (ref >39)
LDL CALC: 45 mg/dL (ref 0–99)
TRIGLYCERIDES: 136 mg/dL (ref 0–149)
VLDL CHOLESTEROL CAL: 27 mg/dL (ref 5–40)

## 2018-05-11 LAB — T4, FREE: Free T4: 1.02 ng/dL (ref 0.82–1.77)

## 2018-05-11 LAB — T3: T3, Total: 235 ng/dL — ABNORMAL HIGH (ref 71–180)

## 2018-05-11 LAB — CBC WITH DIFFERENTIAL/PLATELET
Basophils Absolute: 0.1 10*3/uL (ref 0.0–0.2)
Basos: 1 %
EOS (ABSOLUTE): 0.7 10*3/uL — ABNORMAL HIGH (ref 0.0–0.4)
EOS: 8 %
HEMATOCRIT: 42.9 % (ref 34.0–46.6)
HEMOGLOBIN: 14 g/dL (ref 11.1–15.9)
Immature Grans (Abs): 0 10*3/uL (ref 0.0–0.1)
Immature Granulocytes: 0 %
LYMPHS ABS: 3.6 10*3/uL — AB (ref 0.7–3.1)
Lymphs: 41 %
MCH: 28.4 pg (ref 26.6–33.0)
MCHC: 32.6 g/dL (ref 31.5–35.7)
MCV: 87 fL (ref 79–97)
MONOCYTES: 8 %
MONOS ABS: 0.7 10*3/uL (ref 0.1–0.9)
NEUTROS ABS: 3.6 10*3/uL (ref 1.4–7.0)
Neutrophils: 42 %
Platelets: 465 10*3/uL — ABNORMAL HIGH (ref 150–450)
RBC: 4.93 x10E6/uL (ref 3.77–5.28)
RDW: 13.5 % (ref 12.3–15.4)
WBC: 8.6 10*3/uL (ref 3.4–10.8)

## 2018-05-11 LAB — VITAMIN D 25 HYDROXY (VIT D DEFICIENCY, FRACTURES): Vit D, 25-Hydroxy: 19.2 ng/mL — ABNORMAL LOW (ref 30.0–100.0)

## 2018-05-11 LAB — HEMOGLOBIN A1C
Est. average glucose Bld gHb Est-mCnc: 111 mg/dL
Hgb A1c MFr Bld: 5.5 % (ref 4.8–5.6)

## 2018-05-11 LAB — TSH: TSH: 3.34 u[IU]/mL (ref 0.450–4.500)

## 2018-05-11 LAB — FOLATE: FOLATE: 11.4 ng/mL (ref >3.0)

## 2018-05-11 LAB — INSULIN, RANDOM: INSULIN: 31.3 u[IU]/mL — ABNORMAL HIGH (ref 2.6–24.9)

## 2018-05-11 LAB — VITAMIN B12: VITAMIN B 12: 373 pg/mL (ref 232–1245)

## 2018-05-17 NOTE — Progress Notes (Signed)
.  Office: [phone]  /  Fax: [phone]   HPI:   Chief Complaint: OBESITY  Gina Cameron (MR# [id number]) is a 32 y.o. female who presents on 05/17/2018 for obesity evaluation and treatment. Current BMI is Body mass index is 42.77 kg/m.Marland Kitchen Gina Cameron has struggled with obesity for years and has been unsuccessful in either losing weight or maintaining long term weight loss. Gina Cameron attended our information session and states she is currently in the action stage of change and ready to dedicate time achieving and maintaining a healthier weight.  Gina Cameron states her family eats meals together she thinks her family will eat healthier with  her her desired weight loss is 21 lbs she has been heavy most of  her life her heaviest weight ever was 226.3 lbs. she is frequently drinking liquids with calories she frequently eats larger portions than normal  she has binge eating behaviors she struggles with emotional eating    Fatigue Gina Cameron feels her energy is lower than it should be. This has worsened with weight gain and has not worsened recently. Gina Cameron admits to daytime somnolence and she admits to waking up still tired. Patient is at risk for obstructive sleep apnea. Gina Cameron has a history of symptoms of daytime fatigue, morning fatigue and morning headache. Patient generally gets 8 to 10 hours of sleep per night, and states they generally have restless sleep. Snoring is present. Apneic episodes are present. Epworth Sleepiness Score is 11  Dyspnea on exertion Jara notes increasing shortness of breath with exercising and seems to be worsening over time with weight gain. She notes getting out of breath sooner with activity than she used to. This has not gotten worse recently. Avo denies orthopnea.  Sleep Apnea Abria has a history of sleep apnea and she is not on CPAP. She admits to fatigue. Dorrine states she tried CPAP in the past, but her anxiety makes wearing CPAP impossible.   Hyperglycemia Gina Cameron  has a history of some elevated blood glucose readings without a diagnosis of diabetes. There are no recent labs. She admits to polyphagia.  Vitamin D deficiency Gina Cameron has a diagnosis of vitamin D deficiency. She is not currently taking vit D and denies nausea, vomiting or muscle weakness.  Hyperlipidemia Gina Cameron is at high risk of elevated cholesterol and she is attempting to improve her cholesterol levels with intensive lifestyle modification including a low saturated fat diet, exercise and weight loss. She denies any chest pain.  Bipolar Depression Gina Cameron has bipolar II depression and she states she does significant emotional eating. Gina Cameron is seeing a therapist every 2 weeks. She shows no sign of suicidal or homicidal ideations.  Depression Screen Gina Cameron's Food and Mood (modified PHQ-9) score was  Depression screen PHQ 2/9 05/10/2018  Decreased Interest 1  Down, Depressed, Hopeless 1  PHQ - 2 Score 2  Altered sleeping 1  Tired, decreased energy 1  Change in appetite 0  Feeling bad or failure about yourself  0  Trouble concentrating 0  Moving slowly or fidgety/restless 0  Suicidal thoughts 0  PHQ-9 Score 4  Difficult doing work/chores Not difficult at all    ALLERGIES: Allergies  Allergen Reactions  . Lidocaine-Epinephrine Shortness Of Breath    REACTION: wheezing  . Mepivacaine Hcl Shortness Of Breath    REACTION: wheezing  . Diphenhydramine Hcl Swelling  . Penicillins Other (See Comments)    Triggers seizures and coma  . Latex Rash    MEDICATIONS: Current Outpatient Medications on File Prior to Visit  Medication Sig Dispense Refill  . albuterol (PROVENTIL HFA;VENTOLIN HFA) 108 (90 Base) MCG/ACT inhaler Inhale 2 puffs into the lungs every 4 (four) hours.    . betamethasone valerate ointment (VALISONE) 0.1 % Apply 1 application topically 2 (two) times daily.    . cetirizine (ZYRTEC) 10 MG tablet Take 1 tablet by mouth daily.    . drospirenone-ethinyl estradiol  (YASMIN,ZARAH,SYEDA) 3-0.03 MG tablet Take 1 tablet by mouth daily. 1 Package 12  . ibuprofen (ADVIL,MOTRIN) 800 MG tablet Take 1 tablet by mouth every 6 (six) hours as needed for pain.    Marland Kitchen levocetirizine (XYZAL) 5 MG tablet Take 1 tablet by mouth every morning.    . meloxicam (MOBIC) 15 MG tablet Take 1 tablet (15 mg total) by mouth daily. 30 tablet 2  . Multiple Vitamin (MULTIVITAMINS PO) Take by mouth.    Marland Kitchen omeprazole (PRILOSEC) 20 MG capsule Take 1 capsule (20 mg total) by mouth daily. 30 capsule 3  . rizatriptan (MAXALT-MLT) 10 MG disintegrating tablet Take 1 tablet by mouth as needed.    . topiramate (TOPAMAX) 100 MG tablet Take 1 tablet (100 mg total) by mouth 2 (two) times daily. 28 tablet 0   No current facility-administered medications on file prior to visit.     PAST MEDICAL HISTORY: Past Medical History:  Diagnosis Date  . Anxiety   . Bipolar 2 disorder ([place])   . Constipation   . Depression   . GERD (gastroesophageal reflux disease)   . Hives   . Joint pain   . Leg edema   . Migraine   . PTSD (post-traumatic stress disorder)   . Reflux   . Seizures ([place])   . Sleep apnea   . 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 CHOLECYSTECTOMY    . OVARIAN CYST REMOVAL    . WISDOM TOOTH EXTRACTION      SOCIAL HISTORY: Social History   Tobacco Use  . Smoking status: Former Smoker    Packs/day: 0.10    Years: 2.00    Pack years: 0.20    Types: Cigarettes    Last attempt to quit: 09/06/2012    Years since quitting: 5.6  . Smokeless tobacco: Never Used  Substance Use Topics  . Alcohol use: Yes    Comment: Socially  . Drug use: Yes    Types: Marijuana    FAMILY HISTORY: Family History  Problem Relation Age of Onset  . Hypertension Father   . Hyperlipidemia Father   . Depression Father   . Hypertension Mother   . Heart disease Mother   . Hypertension Maternal Grandmother   . Diabetes Maternal  Grandmother   . Hyperlipidemia Maternal Grandmother     ROS: Review of Systems  Constitutional: Positive for malaise/fatigue.  HENT: Positive for congestion (nasal stuffiness) and sinus pain.   Eyes: Positive for redness.       + Wear Glasses or Contacts + Blurry or Double Vision  Respiratory: Positive for cough and shortness of breath.   Cardiovascular: Negative for chest pain and orthopnea.       + Shortness of Breath with Activity + Calf/Leg Pain with Walking + Leg Cramping + Very Cold Feet or Hands  Gastrointestinal: Positive for constipation, diarrhea, heartburn and nausea. Negative for vomiting.  Musculoskeletal: Positive for neck pain.       + Neck Stiffness Negative for muscle weakness  Skin:       +  Itching + Dryness  Neurological: Positive for headaches.  Endo/Heme/Allergies:       + Hyperglycemia + Polyphagia  Psychiatric/Behavioral: Positive for depression. Negative for suicidal ideas. The patient has insomnia.     PHYSICAL EXAM: Blood pressure 110/75, pulse 78, temperature 98.2 F (36.8 C), temperature source Oral, height 5' (1.524 m), weight 219 lb (99.3 kg), last menstrual period 03/07/2018, SpO2 99 %. Body mass index is 42.77 kg/m. Physical Exam  Constitutional: She is oriented to person, place, and time. She appears well-developed and well-nourished.  HENT:  Head: Normocephalic and atraumatic.  Nose: Nose normal.  Eyes: EOM are normal. No scleral icterus.  Neck: Normal range of motion. Neck supple. No thyromegaly present.  Cardiovascular: Normal rate and regular rhythm.  Pulmonary/Chest: Effort normal. No respiratory distress.  Abdominal: Soft. There is no tenderness.  + Obesity  Musculoskeletal: Normal range of motion.  Range of Motion normal in all 4 extremities  Neurological: She is alert and oriented to person, place, and time. Coordination normal.  Skin: Skin is warm and dry.  Psychiatric: She has a normal mood and affect. She expresses no  homicidal and no suicidal ideation.  Vitals reviewed.   RECENT LABS AND TESTS: BMET    Component Value Date/Time   NA 135 05/10/2018 1207   K 4.4 05/10/2018 1207   CL 100 05/10/2018 1207   CO2 19 (L) 05/10/2018 1207   GLUCOSE 79 05/10/2018 1207   GLUCOSE 82 02/23/2018 1150   BUN 12 05/10/2018 1207   CREATININE 0.86 05/10/2018 1207   CREATININE 0.83 01/12/2013 1627   CALCIUM 10.1 05/10/2018 1207   GFRNONAA 91 05/10/2018 1207   GFRAA 105 05/10/2018 1207   Lab Results  Component Value Date   HGBA1C 5.5 05/10/2018   Lab Results  Component Value Date   INSULIN 31.3 (H) 05/10/2018   CBC    Component Value Date/Time   WBC 8.6 05/10/2018 1207   WBC 5.7 02/23/2018 1150   RBC 4.93 05/10/2018 1207   RBC 4.67 02/23/2018 1150   HGB 14.0 05/10/2018 1207   HCT 42.9 05/10/2018 1207   PLT 465 (H) 05/10/2018 1207   MCV 87 05/10/2018 1207   MCH 28.4 05/10/2018 1207   MCH 27.2 01/12/2013 1627   MCHC 32.6 05/10/2018 1207   MCHC 33.5 02/23/2018 1150   RDW 13.5 05/10/2018 1207   LYMPHSABS 3.6 (H) 05/10/2018 1207   MONOABS 0.5 02/23/2018 1150   EOSABS 0.7 (H) 05/10/2018 1207   BASOSABS 0.1 05/10/2018 1207   Iron/TIBC/Ferritin/ %Sat No results found for: IRON, TIBC, FERRITIN, IRONPCTSAT Lipid Panel     Component Value Date/Time   CHOL 142 05/10/2018 1207   TRIG 136 05/10/2018 1207   HDL 70 05/10/2018 1207   CHOLHDL 2.0 05/10/2018 1207   CHOLHDL 2 02/23/2018 1150   VLDL 18.4 02/23/2018 1150   LDLCALC 45 05/10/2018 1207   Hepatic Function Panel     Component Value Date/Time   PROT 7.2 05/10/2018 1207   ALBUMIN 4.1 05/10/2018 1207   AST 15 05/10/2018 1207   ALT 20 05/10/2018 1207   ALKPHOS 67 05/10/2018 1207   BILITOT <0.2 05/10/2018 1207   BILIDIR 0.0 05/23/2013 1118   IBILI 0.2 01/12/2013 1627      Component Value Date/Time   TSH 3.340 05/10/2018 1207   Vitamin D There are no recent lab results  ECG  shows NSR with a rate of 88 BPM INDIRECT CALORIMETER done  today shows a VO2 of 262 and a REE  of 1822. Her calculated basal metabolic rate is 9811 thus her basal metabolic rate is better than expected.    ASSESSMENT AND PLAN: Other fatigue - Plan: EKG 12-Lead, Comprehensive metabolic panel, CBC with Differential/Platelet, T3, T4, free, TSH  Shortness of breath on exertion  Bipolar depression (HCC)  Other sleep apnea  Hyperglycemia - Plan: Hemoglobin A1c, Insulin, random  Vitamin D deficiency - Plan: VITAMIN D 25 Hydroxy (Vit-D Deficiency, Fractures)  Other hyperlipidemia - Plan: Lipid panel  Screening for depression  Class 3 severe obesity with serious comorbidity and body mass index (BMI) of 40.0 to 44.9 in adult, unspecified obesity type (HCC)  PLAN:  Fatigue Gina Cameron was informed that her fatigue may be related to obesity, depression or many other causes. Labs will be ordered, and in the meanwhile Gina Cameron has agreed to work on diet, exercise and weight loss to help with fatigue. Proper sleep hygiene was discussed including the need for 7-8 hours of quality sleep each night. A sleep study was not ordered based on symptoms and Epworth score.  Dyspnea on exertion Kailoni's shortness of breath appears to be obesity related and exercise induced. She has agreed to work on weight loss and gradually increase exercise to treat her exercise induced shortness of breath. If Samyia follows our instructions and loses weight without improvement of her shortness of breath, we will plan to refer to pulmonology. We will monitor this condition regularly. Zorianna agrees to this plan.  Sleep Apnea Rosalynd will start to work on weight loss to help treat sleep apnea and we will continue to follow.  Hyperglycemia Fasting labs will be obtained and results with be discussed with Gina Cameron in 2 weeks at her follow up visit. In the meanwhile Kaniah was started on a lower simple carbohydrate diet and will work on weight loss efforts.  Vitamin D Deficiency Gina Cameron was informed  that low vitamin D levels contributes to fatigue and are associated with obesity, breast, and colon cancer. She will follow up for routine testing of vitamin D, at least 2-3 times per year. We will check labs and Gina Cameron agrees to follow up with our clinic in 2 weeks.  Hyperlipidemia Gina Cameron was informed of the American Heart Association Guidelines emphasizing intensive lifestyle modifications as the first line treatment for hyperlipidemia. We discussed many lifestyle modifications today in depth, and Gina Cameron will start to work on decreasing saturated fats such as fatty red meat, butter and many fried foods. She will also increase vegetables and lean protein in her diet and start to work on exercise and weight loss efforts. We will check labs and results will be discussed with Gina Cameron in 2 weeks at her follow up visit.  Bipolar Depression We will continue to monitor her bipolar depression, as this may make following a structured plan difficult for this patient.  Depression Screen Gina Cameron had a negative depression screening. Depression is commonly associated with obesity and often results in emotional eating behaviors. We will monitor this closely and work on CBT to help improve the non-hunger eating patterns.   Obesity Gina Cameron is currently in the action stage of change and her goal is to continue with weight loss efforts She has agreed to follow the Category 2 plan +100 calories Gina Cameron has been instructed to work up to a goal of 150 minutes of combined cardio and strengthening exercise per week for weight loss and overall health benefits. We discussed the following Behavioral Modification Strategies today: increasing lean protein intake, decreasing simple carbohydrates , work on meal  planning and easy cooking plans and holiday eating strategies   Gina Cameron has agreed to follow up with our clinic in 2 weeks. She was informed of the importance of frequent follow up visits to maximize her success with intensive  lifestyle modifications for her multiple health conditions. She was informed we would discuss her lab results at her next visit unless there is a critical issue that needs to be addressed sooner. Gina Cameron agreed to keep her next visit at the agreed upon time to discuss these results.    OBESITY BEHAVIORAL INTERVENTION VISIT  Today's visit was # 1   Starting weight: 219 lbs Starting date: 05/10/2018 Today's weight : 219 lbs  Today's date: 05/10/2018 Total lbs lost to date: 0 At least 15 minutes were spent on discussing the following behavioral intervention visit.   ASK: We discussed the diagnosis of obesity with [name] today and Francesa agreed to give [place] permission to discuss obesity behavioral modification therapy today.  ASSESS: Chariti has the diagnosis of obesity and her BMI today is 42.77 Layani is in the action stage of change   ADVISE: Michaila was educated on the multiple health risks of obesity as well as the benefit of weight loss to improve her health. She was advised of the need for long term treatment and the importance of lifestyle modifications to improve her current health and to decrease her risk of future health problems.  AGREE: Multiple dietary modification options and treatment options were discussed and  Mariamawit agreed to follow the recommendations documented in the above note.  ARRANGE: Lenee was educated on the importance of frequent visits to treat obesity as outlined per CMS and USPSTF guidelines and agreed to schedule her next follow up appointment today.   I, Doreene Nest, am acting as transcriptionist for Dennard Nip, MD   I have reviewed the above documentation for accuracy and completeness, and I agree with the above. -Dennard Nip, MD

## 2018-05-22 ENCOUNTER — Ambulatory Visit (INDEPENDENT_AMBULATORY_CARE_PROVIDER_SITE_OTHER): Payer: Medicare Other | Admitting: Family Medicine

## 2018-05-22 ENCOUNTER — Encounter: Payer: Self-pay | Admitting: Family Medicine

## 2018-05-22 VITALS — BP 120/80 | HR 80 | Ht 60.0 in | Wt 220.0 lb

## 2018-05-22 DIAGNOSIS — M79671 Pain in right foot: Secondary | ICD-10-CM | POA: Diagnosis not present

## 2018-05-22 NOTE — Patient Instructions (Signed)
Wear the boot for the next 2-4 weeks but when comfortable try to switch to regular supportive shoes. Icing 15 minutes at a time 3-4 times a day if needed. Tylenol, meloxicam only if needed. Follow up with me in 4 weeks at the latest. If you're not improving as expected we will consider an MRI.
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
## 2018-05-23 ENCOUNTER — Encounter (INDEPENDENT_AMBULATORY_CARE_PROVIDER_SITE_OTHER): Payer: Self-pay | Admitting: Family Medicine

## 2018-05-24 ENCOUNTER — Ambulatory Visit (INDEPENDENT_AMBULATORY_CARE_PROVIDER_SITE_OTHER): Payer: Medicare Other | Admitting: Family Medicine

## 2018-05-24 VITALS — BP 113/77 | HR 91 | Temp 97.9°F | Ht 60.0 in | Wt 220.0 lb

## 2018-05-24 DIAGNOSIS — E8881 Metabolic syndrome: Secondary | ICD-10-CM | POA: Diagnosis not present

## 2018-05-24 DIAGNOSIS — Z6841 Body Mass Index (BMI) 40.0 and over, adult: Secondary | ICD-10-CM

## 2018-05-24 DIAGNOSIS — E559 Vitamin D deficiency, unspecified: Secondary | ICD-10-CM

## 2018-05-24 DIAGNOSIS — E66813 Obesity, class 3: Secondary | ICD-10-CM

## 2018-05-24 DIAGNOSIS — E88819 Insulin resistance, unspecified: Secondary | ICD-10-CM

## 2018-05-24 MED ORDER — VITAMIN D (ERGOCALCIFEROL) 1.25 MG (50000 UNIT) PO CAPS: 50000.0000 [IU] | ORAL_CAPSULE | ORAL | 0 refills | Status: DC

## 2018-05-29 ENCOUNTER — Telehealth: Payer: Self-pay | Admitting: Family Medicine

## 2018-05-29 NOTE — Progress Notes (Signed)
Office: [phone]  /  Fax: [phone]   HPI:   Chief Complaint: OBESITY Gina Cameron is here to discuss her progress with her obesity treatment plan. She is on the Category 2 plan and is following her eating plan approximately 40 % of the time. She states she is walking 5 minutes 3 to 4 times per week. Gina Cameron states that she tried to follow the plan, but she didn't do well with meal planning. She skipped meals frequently and snacked more. She states that she doesn't like eating, but then describes significant emotional snacking.  Her weight is 220 lb (99.8 kg) today and has had a weight gain of 1 pound over a period of 2 weeks since her last visit. She has lost 0 lbs since starting treatment with [place].  Vitamin D deficiency Gina Cameron has a new diagnosis of vitamin D deficiency. She is not currently taking vit D. She admits fatigue and denies nausea, vomiting, or muscle weakness.  Insulin Resistance Gina Cameron has a new diagnosis of insulin resistance based on her elevated fasting insulin level >5. Although Gina Cameron's blood glucose readings are still under good control, insulin resistance puts her at greater risk of metabolic syndrome and diabetes. She is not taking metformin currently and continues to work on diet and exercise to decrease risk of diabetes. She denies polyphagia, but I suspect she is doing a lot of mindless eating.  ALLERGIES: Allergies  Allergen Reactions  . Lidocaine-Epinephrine Shortness Of Breath    REACTION: wheezing  . Mepivacaine Hcl Shortness Of Breath    REACTION: wheezing  . Diphenhydramine Hcl Swelling  . Penicillins Other (See Comments)    Triggers seizures and coma  . Latex Rash    MEDICATIONS: Current Outpatient Medications on File Prior to Visit  Medication Sig Dispense Refill  . albuterol (PROVENTIL HFA;VENTOLIN HFA) 108 (90 Base) MCG/ACT inhaler Inhale 2 puffs into the lungs every 4 (four) hours.    . betamethasone valerate ointment (VALISONE) 0.1 % Apply 1  application topically 2 (two) times daily.    . cetirizine (ZYRTEC) 10 MG tablet Take 1 tablet by mouth daily.    . drospirenone-ethinyl estradiol (YASMIN,ZARAH,SYEDA) 3-0.03 MG tablet Take 1 tablet by mouth daily. 1 Package 12  . ibuprofen (ADVIL,MOTRIN) 800 MG tablet Take 1 tablet by mouth every 6 (six) hours as needed for pain.    Marland Kitchen levocetirizine (XYZAL) 5 MG tablet Take 1 tablet by mouth every morning.    . meloxicam (MOBIC) 15 MG tablet Take 1 tablet (15 mg total) by mouth daily. 30 tablet 2  . Multiple Vitamin (MULTIVITAMINS PO) Take by mouth.    Marland Kitchen omeprazole (PRILOSEC) 20 MG capsule Take 1 capsule (20 mg total) by mouth daily. 30 capsule 3  . rizatriptan (MAXALT-MLT) 10 MG disintegrating tablet Take 1 tablet by mouth as needed.    . topiramate (TOPAMAX) 100 MG tablet Take 1 tablet (100 mg total) by mouth 2 (two) times daily. 28 tablet 0   No current facility-administered medications on file prior to visit.     PAST MEDICAL HISTORY: Past Medical History:  Diagnosis Date  . Anxiety   . Bipolar 2 disorder ([place])   . Constipation   . Depression   . GERD (gastroesophageal reflux disease)   . Hives   . Joint pain   . Leg edema   . Migraine   . PTSD (post-traumatic stress disorder)   . Reflux   . Seizures ([place])   . Sleep apnea   . 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 CHOLECYSTECTOMY    . OVARIAN CYST REMOVAL    . WISDOM TOOTH EXTRACTION      SOCIAL HISTORY: Social History   Tobacco Use  . Smoking status: Former Smoker    Packs/day: 0.10    Years: 2.00    Pack years: 0.20    Types: Cigarettes    Last attempt to quit: 09/06/2012    Years since quitting: 5.7  . Smokeless tobacco: Never Used  Substance Use Topics  . Alcohol use: Yes    Comment: Socially  . Drug use: Yes    Types: Marijuana    FAMILY HISTORY: Family History  Problem Relation Age of Onset  . Hypertension Father   .  Hyperlipidemia Father   . Depression Father   . Hypertension Mother   . Heart disease Mother   . Hypertension Maternal Grandmother   . Diabetes Maternal Grandmother   . Hyperlipidemia Maternal Grandmother     ROS: Review of Systems  Constitutional: Positive for malaise/fatigue. Negative for weight loss.  Gastrointestinal: Negative for nausea and vomiting.  Musculoskeletal:       Negative for muscle weakness.  Endo/Heme/Allergies:       Negative for polyphagia.    PHYSICAL EXAM: Blood pressure 113/77, pulse 91, temperature 97.9 F (36.6 C), temperature source Oral, height 5' (1.524 m), weight 220 lb (99.8 kg), SpO2 100 %. Body mass index is 42.97 kg/m. Physical Exam  Constitutional: She is oriented to person, place, and time. She appears well-developed and well-nourished.  Cardiovascular: Normal rate.  Pulmonary/Chest: Effort normal.  Musculoskeletal: Normal range of motion.  Neurological: She is oriented to person, place, and time.  Skin: Skin is warm and dry.  Psychiatric: She has a normal mood and affect. Her behavior is normal.  Vitals reviewed.   RECENT LABS AND TESTS: BMET    Component Value Date/Time   NA 135 05/10/2018 1207   K 4.4 05/10/2018 1207   CL 100 05/10/2018 1207   CO2 19 (L) 05/10/2018 1207   GLUCOSE 79 05/10/2018 1207   GLUCOSE 82 02/23/2018 1150   BUN 12 05/10/2018 1207   CREATININE 0.86 05/10/2018 1207   CREATININE 0.83 01/12/2013 1627   CALCIUM 10.1 05/10/2018 1207   GFRNONAA 91 05/10/2018 1207   GFRAA 105 05/10/2018 1207   Lab Results  Component Value Date   HGBA1C 5.5 05/10/2018   Lab Results  Component Value Date   INSULIN 31.3 (H) 05/10/2018   CBC    Component Value Date/Time   WBC 8.6 05/10/2018 1207   WBC 5.7 02/23/2018 1150   RBC 4.93 05/10/2018 1207   RBC 4.67 02/23/2018 1150   HGB 14.0 05/10/2018 1207   HCT 42.9 05/10/2018 1207   PLT 465 (H) 05/10/2018 1207   MCV 87 05/10/2018 1207   MCH 28.4 05/10/2018 1207   MCH  27.2 01/12/2013 1627   MCHC 32.6 05/10/2018 1207   MCHC 33.5 02/23/2018 1150   RDW 13.5 05/10/2018 1207   LYMPHSABS 3.6 (H) 05/10/2018 1207   MONOABS 0.5 02/23/2018 1150   EOSABS 0.7 (H) 05/10/2018 1207   BASOSABS 0.1 05/10/2018 1207   Iron/TIBC/Ferritin/ %Sat No results found for: IRON, TIBC, FERRITIN, IRONPCTSAT Lipid Panel     Component Value Date/Time   CHOL 142 05/10/2018 1207   TRIG 136 05/10/2018 1207   HDL 70 05/10/2018 1207   CHOLHDL 2.0 05/10/2018 1207   CHOLHDL 2 02/23/2018 1150  VLDL 18.4 02/23/2018 1150   LDLCALC 45 05/10/2018 1207   Hepatic Function Panel     Component Value Date/Time   PROT 7.2 05/10/2018 1207   ALBUMIN 4.1 05/10/2018 1207   AST 15 05/10/2018 1207   ALT 20 05/10/2018 1207   ALKPHOS 67 05/10/2018 1207   BILITOT <0.2 05/10/2018 1207   BILIDIR 0.0 05/23/2013 1118   IBILI 0.2 01/12/2013 1627      Component Value Date/Time   TSH 3.340 05/10/2018 1207   TSH 1.62 02/23/2018 1150   TSH 1.44 09/09/2015 1525   Results for DAYLA, GASCA (MRN [id number]) as of 05/29/2018 08:02  Ref. Range 05/10/2018 12:07  Vitamin D, 25-Hydroxy Latest Ref Range: 30.0 - 100.0 ng/mL 19.2 (L)   ASSESSMENT AND PLAN: Vitamin D deficiency  Insulin resistance  Class 3 severe obesity with serious comorbidity and body mass index (BMI) of 40.0 to 44.9 in adult, unspecified obesity type (HCC)  PLAN:  Vitamin D Deficiency Gina Cameron was informed that low vitamin D levels contributes to fatigue and are associated with obesity, breast, and colon cancer. She agrees to start to take prescription Vit D @50 ,000 IU every week #4 with no refills and will follow up for routine testing of vitamin D, at least 2-3 times per year. She was informed of the risk of over-replacement of vitamin D and agrees to not increase her dose unless she discusses this with [place] first. Darriona agrees to follow up in 2 weeks.  Insulin Resistance Gina Cameron will continue to work on weight loss, exercise, and  decreasing simple carbohydrates in her diet to help decrease the risk of diabetes. We discussed metformin including benefits and risks. She was informed that eating too many simple carbohydrates or too many calories at one sitting increases the likelihood of GI side effects. Gina Cameron deferred metformin for now, due to patient denying polyphagia. Gina Cameron agreed to continue with diet and exercise and will follow up with [place] as directed to monitor her progress.  Obesity Gina Cameron is currently in the action stage of change. As such, her goal is to continue with weight loss efforts. She has agreed to follow the Category 2 plan. Gina Cameron has been instructed to work up to a goal of 150 minutes of combined cardio and strengthening exercise per week for weight loss and overall health benefits. We discussed the following Behavioral Modification Strategies today: increasing lean protein intake, decreasing simple carbohydrates, work on meal planning and easy cooking plans, and holiday eating strategies.   Gina Cameron has agreed to follow up with our clinic in 2 weeks. She was informed of the importance of frequent follow up visits to maximize her success with intensive lifestyle modifications for her multiple health conditions.   OBESITY BEHAVIORAL INTERVENTION VISIT  Today's visit was # 2   Starting weight: 219 lbs Starting date: 05/10/18 Today's weight : Weight: 220 lb (99.8 kg)  Today's date: 05/24/2018 Total lbs lost to date: 0 At least 15 minutes were spent on discussing the following behavioral intervention visit.  ASK: We discussed the diagnosis of obesity with Gina Cameron today and Gina Cameron agreed to give [place] permission to discuss obesity behavioral modification therapy today.  ASSESS: Gina Cameron has the diagnosis of obesity and her BMI today is 42.97. Gina Cameron is in the action stage of change.   ADVISE: Gina Cameron was educated on the multiple health risks of obesity as well as the benefit of weight loss to improve her  health. She was advised of the need for long term  treatment and the importance of lifestyle modifications to improve her current health and to decrease her risk of future health problems.  AGREE: Multiple dietary modification options and treatment options were discussed and Jaiyana agreed to follow the recommendations documented in the above note.  ARRANGE: Gina Cameron was educated on the importance of frequent visits to treat obesity as outlined per CMS and USPSTF guidelines and agreed to schedule her next follow up appointment today.  I, Marcille Blanco, am acting as transcriptionist for Starlyn Skeans, MD  I have reviewed the above documentation for accuracy and completeness, and I agree with the above. -Dennard Nip, MD

## 2018-05-29 NOTE — Telephone Encounter (Signed)
Copied from [place] [id number]. Topic: Quick Communication - Rx Refill/Question >> May 29, 2018  1:02 PM Margot Ables wrote: Medication: levocetirizine (XYZAL) 5 MG tablet - pt states she takes 2/day and requesting RX to be sent to pharmacy - she said Dr. Carollee Herter told her to increase from 1 to 2/day and it is too soon to refill without a new RX - she states the pharmacy told her to call MD   Has the patient contacted their pharmacy? yes Preferred Pharmacy (with phone number or street name): [id number] - [place], [place] - [address] AT [address] [place] [phone] (Phone) [phone] (Fax)

## 2018-05-30 ENCOUNTER — Telehealth: Payer: Self-pay | Admitting: *Deleted

## 2018-05-30 NOTE — Telephone Encounter (Signed)
Copied from [place] [id number]. Topic: Referral - Request for Referral >> May 30, 2018  1:23 PM Bea Graff, NT wrote: Has patient seen PCP for this complaint? Yes.   *If NO, is insurance requiring patient see PCP for this issue before PCP can refer them? Referral for which specialty: Psychiatrist Preferred provider/office: One that takes medicare and medicaid Reason for referral: for medications

## 2018-05-31 NOTE — Telephone Encounter (Signed)
Pt needs to call and make appointment ---  We can send her the list we use for referrals but I'm afraid it may be hard to find one that takes medicare/ medicaid [facility] Also try [facility]---- all 3 on our list

## 2018-06-01 ENCOUNTER — Telehealth: Payer: Self-pay | Admitting: *Deleted

## 2018-06-01 MED ORDER — LEVOCETIRIZINE DIHYDROCHLORIDE 5 MG PO TABS: 10.0000 mg | ORAL_TABLET | ORAL | 2 refills | Status: DC

## 2018-06-01 NOTE — Telephone Encounter (Signed)
Notified pt of below and that if insurance does not approve 2 tabs daily of levocetirizine that she will need to follow back up with dermatology. Rx sent. Pt states she has not established with dermatology here. States she was contacted by [facility] but she was never able to connect with them to schedule an appointment. Provided pt with contact number to reach back out to them for an appointment.

## 2018-06-01 NOTE — Telephone Encounter (Signed)
Correction to below documentation. Spoke with pt. She states her dermatologist told her she could take 2 levocetirizine a day and she is wanting to know if PCP will send in a refill of this with the increased dose?  Please advise?

## 2018-06-01 NOTE — Telephone Encounter (Signed)
Opened in error

## 2018-06-01 NOTE — Telephone Encounter (Signed)
The derm should have done that ---  She can get xyzal otc  Don't think ins will pay for 2 a day but we can send it in

## 2018-06-01 NOTE — Telephone Encounter (Signed)
Notified pt and provided her with contact #s for the facilities mentioned below. She requests that the remaining list be mailed to her. List has been mailed.

## 2018-06-07 ENCOUNTER — Encounter (INDEPENDENT_AMBULATORY_CARE_PROVIDER_SITE_OTHER): Payer: Self-pay

## 2018-06-07 ENCOUNTER — Ambulatory Visit (INDEPENDENT_AMBULATORY_CARE_PROVIDER_SITE_OTHER): Payer: Self-pay | Admitting: Family Medicine

## 2018-06-08 ENCOUNTER — Telehealth: Payer: Self-pay

## 2018-06-08 ENCOUNTER — Ambulatory Visit (INDEPENDENT_AMBULATORY_CARE_PROVIDER_SITE_OTHER): Payer: Medicare Other | Admitting: Obstetrics & Gynecology

## 2018-06-08 ENCOUNTER — Encounter: Payer: Self-pay | Admitting: Obstetrics & Gynecology

## 2018-06-08 VITALS — BP 119/80 | HR 80 | Ht 61.0 in | Wt 221.4 lb

## 2018-06-08 DIAGNOSIS — Z30430 Encounter for insertion of intrauterine contraceptive device: Secondary | ICD-10-CM

## 2018-06-08 DIAGNOSIS — Z30014 Encounter for initial prescription of intrauterine contraceptive device: Secondary | ICD-10-CM

## 2018-06-08 DIAGNOSIS — Z3040 Encounter for surveillance of contraceptives, unspecified: Secondary | ICD-10-CM

## 2018-06-08 LAB — POCT PREGNANCY, URINE: Preg Test, Ur: NEGATIVE

## 2018-06-08 NOTE — Telephone Encounter (Signed)
Tried initiating PA via Covermymeds; KEY: [id number]. Informed that this medication is already a covered benefit.   Prior authorization is not required for the requested medication under the patient's Part D prescription benefit. If you have questions or need further assistance, please call the number on the back of the patient's insurance card.

## 2018-06-08 NOTE — Progress Notes (Signed)
GYNECOLOGY CLINIC PROCEDURE NOTE  Gina Cameron is a 32 y.o. G0P0000 here for [place] IUD insertion. No GYN concerns.  Last pap smear was on 03/29/2018 and was normal.  IUD Insertion Procedure Note Patient identified, informed consent performed.  Discussed risks of irregular bleeding, cramping, infection, malpositioning or misplacement of the IUD outside the uterus which may require further procedures. Time out was performed.  Urine pregnancy test negative.  Speculum placed in the vagina.  Cervix visualized.  Cleaned with Betadine x 2.  Grasped anteriorly with a single tooth tenaculum.  Uterus sounded to 6cm.  Liletta IUD placed per manufacturer's recommendations.  Strings trimmed to 3 cm. Tenaculum was removed, good hemostasis noted.  Patient tolerated procedure well.   Patient was given post-procedure instructions.  Patient was asked to follow up in 4 weeks for IUD check.  Abhinav Mayorquin L. Harraway-Smith, M.D., Cherlynn June

## 2018-06-08 NOTE — Progress Notes (Signed)
Elevated PHQ-9 scale.  Pt declined integrated behavioral health.

## 2018-06-09 ENCOUNTER — Other Ambulatory Visit: Payer: Self-pay

## 2018-06-09 MED ORDER — LEVONORGESTREL 19.5 MCG/DAY IU IUD
INTRAUTERINE_SYSTEM | Freq: Once | INTRAUTERINE | Status: AC
  Administered 2018-06-08: 10:00:00 via INTRAUTERINE

## 2018-06-09 NOTE — Progress Notes (Signed)
Opened in error

## 2018-06-09 NOTE — Progress Notes (Deleted)
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  History supplemented by ED and referring provider notes.  Onset:  *** Location:  *** Quality:  *** Intensity:  ***.  *** denies new headache, thunderclap headache or severe headache that wakes *** from sleep. Aura:  *** Prodrome:  *** Postdrome:  *** Associated symptoms:  ***.  *** denies associated unilateral numbness or weakness. Duration:  *** Frequency:  *** Frequency of abortive medication: *** Triggers:  *** Exacerbating factors:  *** Relieving factors:  *** Activity:  ***  Current NSAIDS: Ibuprofen Current analgesics:  *** Current triptans: Maxalt-MLT 10 mg Current ergotamine: None Current anti-emetic: None Current muscle relaxants: None Current anti-anxiolytic: None Current sleep aide: None Current Antihypertensive medications: None Current Antidepressant medications: None Current Anticonvulsant medications: Topiramate 100 mg twice daily Current anti-CGRP: None Current Vitamins/Herbal/Supplements: Vitamin D, multivitamin Current Antihistamines/Decongestants:  *** Other therapy: None Hormone-birth control:***  Past NSAIDS: Naproxen 500 mg Past analgesics: Excedrin Migraine Past abortive triptans: Sumatriptan 100 mg Past abortive ergotamine:  *** Past muscle relaxants:  *** Past anti-emetic: Reglan Past antihypertensive medications:  *** Past antidepressant medications: Wellbutrin, Celexa Past anticonvulsant medications:  *** Past anti-CGRP:  *** Past vitamins/Herbal/Supplements:  *** Past antihistamines/decongestants:  *** Other past therapies:  ***  Caffeine:  *** Alcohol:  *** Smoker:   *** Diet:  *** Exercise:  *** Depression:  ***; Anxiety:  *** Other pain:  *** Sleep hygiene:  *** Family history of headache:  ***  MRI of the brain without contrast from 06/12/2009 and MRI brain with and without contrast from 08/18/2007 were personally reviewed and demonstrated atrophy of the left occipital and parietal lobes with gyriform calcification with 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   PAST MEDICAL HISTORY: Past Medical History:  Diagnosis Date  . Anxiety   . Bipolar 2 disorder ([place])   . Constipation   . Depression   . GERD (gastroesophageal reflux disease)   . Hives   . Joint pain   . Leg edema   . Migraine   . PTSD (post-traumatic stress disorder)   . Reflux   . Seizures ([place])   . Sleep apnea   . 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 CHOLECYSTECTOMY    . OVARIAN CYST REMOVAL    . WISDOM TOOTH EXTRACTION      MEDICATIONS: Current Outpatient Medications on File Prior to Visit  Medication Sig Dispense Refill  . albuterol (PROVENTIL HFA;VENTOLIN HFA) 108 (90 Base) MCG/ACT inhaler Inhale 2 puffs into the lungs every 4 (four) hours.    . betamethasone valerate ointment (VALISONE) 0.1 % Apply 1 application topically 2 (two) times daily.    . cetirizine (ZYRTEC) 10 MG tablet Take 1 tablet by mouth daily.    Marland Kitchen ibuprofen (ADVIL,MOTRIN) 800 MG tablet Take 1 tablet by mouth every 6 (six) hours as needed for pain.    Marland Kitchen levocetirizine (XYZAL) 5 MG tablet Take 2 tablets (10 mg total) by mouth every morning. 60 tablet 2  . meloxicam (MOBIC) 15 MG tablet Take 1 tablet (15 mg total) by mouth daily. 30 tablet 2  . Multiple Vitamin (MULTIVITAMINS  PO) Take by mouth.    Marland Kitchen omeprazole (PRILOSEC) 20 MG capsule Take 1 capsule (20 mg total) by mouth daily. 30 capsule 3  . rizatriptan (MAXALT-MLT) 10 MG disintegrating tablet  Take 1 tablet by mouth as needed.    . topiramate (TOPAMAX) 100 MG tablet Take 1 tablet (100 mg total) by mouth 2 (two) times daily. 28 tablet 0  . Vitamin D, Ergocalciferol, (DRISDOL) 1.25 MG (50000 UT) CAPS capsule Take 1 capsule (50,000 Units total) by mouth every 7 (seven) days. 4 capsule 0   No current facility-administered medications on file prior to visit.     ALLERGIES: Allergies  Allergen Reactions  . Lidocaine-Epinephrine Shortness Of Breath    REACTION: wheezing  . Mepivacaine Hcl Shortness Of Breath    REACTION: wheezing  . Diphenhydramine Hcl Swelling  . Penicillins Other (See Comments)    Triggers seizures and coma  . Latex Rash    FAMILY HISTORY: Family History  Problem Relation Age of Onset  . Hypertension Father   . Hyperlipidemia Father   . Depression Father   . Hypertension Mother   . Heart disease Mother   . Hypertension Maternal Grandmother   . Diabetes Maternal Grandmother   . Hyperlipidemia Maternal Grandmother     SOCIAL HISTORY: Social History   Socioeconomic History  . Marital status: Single    Spouse name: Not on file  . Number of children: Not on file  . Years of education: Not on file  . Highest education level: Not on file  Occupational History  . Occupation: disabled  Social Needs  . Financial resource strain: Not on file  . Food insecurity:    Worry: Not on file    Inability: Not on file  . Transportation needs:    Medical: Not on file    Non-medical: Not on file  Tobacco Use  . Smoking status: Former Smoker    Packs/day: 0.10    Years: 2.00    Pack years: 0.20    Types: Cigarettes    Last attempt to quit: 09/06/2012    Years since quitting: 5.7  . Smokeless tobacco: Never Used  Substance and Sexual Activity  . Alcohol use: Yes    Comment: Socially  . Drug use: Yes    Types: Marijuana  . Sexual activity: Yes    Birth control/protection: Pill  Lifestyle  . Physical activity:    Days per week: Not on file    Minutes  per session: Not on file  . Stress: Not on file  Relationships  . Social connections:    Talks on phone: Not on file    Gets together: Not on file    Attends religious service: Not on file    Active member of club or organization: Not on file    Attends meetings of clubs or organizations: Not on file    Relationship status: Not on file  . Intimate partner violence:    Fear of current or ex partner: Not on file    Emotionally abused: Not on file    Physically abused: Not on file    Forced sexual activity: Not on file  Other Topics Concern  . Not on file  Social History Narrative  . Not on file    REVIEW OF SYSTEMS: Constitutional: No fevers, chills, or sweats, no generalized fatigue, change in appetite Eyes: No visual changes, double vision, eye pain Ear, nose and throat: No hearing loss, ear pain, nasal congestion, sore throat Cardiovascular: No chest  pain, palpitations Respiratory:  No shortness of breath at rest or with exertion, wheezes GastrointestinaI: No nausea, vomiting, diarrhea, abdominal pain, fecal incontinence Genitourinary:  No dysuria, urinary retention or frequency Musculoskeletal:  No neck pain, back pain Integumentary: No rash, pruritus, skin lesions Neurological: as above Psychiatric: No depression, insomnia, anxiety Endocrine: No palpitations, fatigue, diaphoresis, mood swings, change in appetite, change in weight, increased thirst Hematologic/Lymphatic:  No purpura, petechiae. Allergic/Immunologic: no itchy/runny eyes, nasal congestion, recent allergic reactions, rashes  PHYSICAL EXAM: *** General: No acute distress.  Patient appears ***-groomed.  *** Head:  Normocephalic/atraumatic Eyes:  fundi examined but not visualized Neck: supple, no paraspinal tenderness, full range of motion Back: No paraspinal tenderness Heart: regular rate and rhythm Lungs: Clear to auscultation bilaterally. Vascular: No carotid bruits. Neurological Exam: Mental status:  alert and oriented to person, place, and time, recent and remote memory intact, fund of knowledge intact, attention and concentration intact, speech fluent and not dysarthric, language intact. Cranial nerves: CN I: not tested CN II: pupils equal, round and reactive to light, visual fields intact CN III, IV, VI:  full range of motion, no nystagmus, no ptosis CN V: facial sensation intact CN VII: upper and lower face symmetric CN VIII: hearing intact CN IX, X: gag intact, uvula midline CN XI: sternocleidomastoid and trapezius muscles intact CN XII: tongue midline Bulk & Tone: normal, no fasciculations. Motor:  5/5 throughout *** Sensation:  Pinprick *** temperature *** and vibration sensation intact.  ***. Deep Tendon Reflexes:  2+ throughout, *** toes downgoing.  *** Finger to nose testing:  Without dysmetria.  *** Heel to shin:  Without dysmetria.  *** Gait:  Normal station and stride.  Able to turn and tandem walk. [place] ***.  IMPRESSION: ***  PLAN: 1.  For preventative management, *** 2.  For abortive therapy, *** 3.  Limit use of pain relievers to no more than 2 days out of week to prevent risk of rebound or medication-overuse headache. 4.  Keep headache diary 5.  Exercise, hydration, caffeine cessation, sleep hygiene, monitor for and avoid triggers 6.  Consider:  magnesium citrate 400mg  daily, riboflavin 400mg  daily, and coenzyme Q10 100mg  three times daily 7.  Follow up ***  Thank you for allowing me to take part in the care of this patient.  Metta Clines, DO  CC: Roma Schanz, DO

## 2018-06-12 ENCOUNTER — Ambulatory Visit: Payer: Self-pay | Admitting: Neurology

## 2018-06-12 ENCOUNTER — Encounter: Payer: Self-pay | Admitting: Neurology

## 2018-06-12 DIAGNOSIS — Z029 Encounter for administrative examinations, unspecified: Secondary | ICD-10-CM

## 2018-06-13 NOTE — Telephone Encounter (Signed)
Advised patient to get in touch with ins.  It looks like she is trying to contact derm. Left detailed message on vm

## 2018-06-19 ENCOUNTER — Ambulatory Visit (INDEPENDENT_AMBULATORY_CARE_PROVIDER_SITE_OTHER): Payer: Medicare Other | Admitting: Family Medicine

## 2018-06-19 ENCOUNTER — Encounter: Payer: Self-pay | Admitting: Family Medicine

## 2018-06-19 VITALS — BP 109/68 | HR 112 | Ht 61.0 in | Wt 219.0 lb

## 2018-06-19 DIAGNOSIS — M79671 Pain in right foot: Secondary | ICD-10-CM | POA: Diagnosis not present
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
## 2018-06-19 NOTE — Patient Instructions (Signed)
Follow up with me as needed.

## 2018-06-29 ENCOUNTER — Encounter (INDEPENDENT_AMBULATORY_CARE_PROVIDER_SITE_OTHER): Payer: Self-pay | Admitting: Family Medicine

## 2018-07-07 ENCOUNTER — Encounter: Payer: Self-pay | Admitting: Family Medicine

## 2018-07-10 ENCOUNTER — Ambulatory Visit: Payer: Self-pay | Admitting: Obstetrics & Gynecology

## 2018-08-05 ENCOUNTER — Other Ambulatory Visit: Payer: Self-pay | Admitting: Family Medicine

## 2018-08-05 DIAGNOSIS — K219 Gastro-esophageal reflux disease without esophagitis: Secondary | ICD-10-CM

## 2018-08-24 ENCOUNTER — Encounter: Payer: Self-pay | Admitting: Family Medicine

## 2018-10-17 ENCOUNTER — Encounter: Payer: Self-pay | Admitting: Family Medicine

## 2018-11-01 DIAGNOSIS — L906 Striae atrophicae: Secondary | ICD-10-CM | POA: Diagnosis not present

## 2018-11-01 DIAGNOSIS — L858 Other specified epidermal thickening: Secondary | ICD-10-CM | POA: Diagnosis not present

## 2018-11-01 DIAGNOSIS — L2089 Other atopic dermatitis: Secondary | ICD-10-CM | POA: Diagnosis not present

## 2018-11-01 DIAGNOSIS — L83 Acanthosis nigricans: Secondary | ICD-10-CM | POA: Diagnosis not present

## 2018-11-09 ENCOUNTER — Other Ambulatory Visit: Payer: Self-pay | Admitting: Family Medicine

## 2018-11-11 ENCOUNTER — Encounter (INDEPENDENT_AMBULATORY_CARE_PROVIDER_SITE_OTHER): Payer: Self-pay | Admitting: Family Medicine

## 2018-11-14 ENCOUNTER — Other Ambulatory Visit: Payer: Self-pay | Admitting: *Deleted

## 2018-11-14 MED ORDER — LEVOCETIRIZINE DIHYDROCHLORIDE 5 MG PO TABS: ORAL_TABLET | ORAL | 0 refills | Status: DC

## 2018-11-29 DIAGNOSIS — Z20828 Contact with and (suspected) exposure to other viral communicable diseases: Secondary | ICD-10-CM | POA: Diagnosis not present

## 2019-01-09 ENCOUNTER — Other Ambulatory Visit: Payer: Self-pay

## 2019-01-09 ENCOUNTER — Ambulatory Visit (INDEPENDENT_AMBULATORY_CARE_PROVIDER_SITE_OTHER): Payer: Medicare Other | Admitting: Family Medicine

## 2019-01-09 ENCOUNTER — Encounter: Payer: Self-pay | Admitting: Family Medicine

## 2019-01-09 VITALS — BP 124/75 | HR 66 | Temp 98.1°F | Resp 18 | Ht 61.0 in | Wt 226.4 lb

## 2019-01-09 DIAGNOSIS — K219 Gastro-esophageal reflux disease without esophagitis: Secondary | ICD-10-CM

## 2019-01-09 DIAGNOSIS — Z0000 Encounter for general adult medical examination without abnormal findings: Secondary | ICD-10-CM

## 2019-01-09 DIAGNOSIS — N946 Dysmenorrhea, unspecified: Secondary | ICD-10-CM | POA: Diagnosis not present

## 2019-01-09 DIAGNOSIS — N926 Irregular menstruation, unspecified: Secondary | ICD-10-CM | POA: Diagnosis not present

## 2019-01-09 LAB — CBC WITH DIFFERENTIAL/PLATELET
Basophils Absolute: 0 10*3/uL (ref 0.0–0.1)
Basophils Relative: 0.7 % (ref 0.0–3.0)
Eosinophils Absolute: 0.1 10*3/uL (ref 0.0–0.7)
Eosinophils Relative: 2.2 % (ref 0.0–5.0)
HCT: 41.9 % (ref 36.0–46.0)
Hemoglobin: 13.7 g/dL (ref 12.0–15.0)
Lymphocytes Relative: 54.5 % — ABNORMAL HIGH (ref 12.0–46.0)
Lymphs Abs: 3.4 10*3/uL (ref 0.7–4.0)
MCHC: 32.7 g/dL (ref 30.0–36.0)
MCV: 87.1 fl (ref 78.0–100.0)
Monocytes Absolute: 0.5 10*3/uL (ref 0.1–1.0)
Monocytes Relative: 8 % (ref 3.0–12.0)
Neutro Abs: 2.2 10*3/uL (ref 1.4–7.7)
Neutrophils Relative %: 34.6 % — ABNORMAL LOW (ref 43.0–77.0)
Platelets: 402 10*3/uL — ABNORMAL HIGH (ref 150.0–400.0)
RBC: 4.81 Mil/uL (ref 3.87–5.11)
RDW: 14.9 % (ref 11.5–15.5)
WBC: 6.2 10*3/uL (ref 4.0–10.5)

## 2019-01-09 LAB — COMPREHENSIVE METABOLIC PANEL
ALT: 27 U/L (ref 0–35)
AST: 18 U/L (ref 0–37)
Albumin: 3.8 g/dL (ref 3.5–5.2)
Alkaline Phosphatase: 81 U/L (ref 39–117)
BUN: 8 mg/dL (ref 6–23)
CO2: 22 mEq/L (ref 19–32)
Calcium: 8.9 mg/dL (ref 8.4–10.5)
Chloride: 109 mEq/L (ref 96–112)
Creatinine, Ser: 0.79 mg/dL (ref 0.40–1.20)
GFR: 102.84 mL/min (ref >60.00)
Glucose, Bld: 84 mg/dL (ref 70–99)
Potassium: 4 mEq/L (ref 3.5–5.1)
Sodium: 141 mEq/L (ref 135–145)
Total Bilirubin: 0.2 mg/dL (ref 0.2–1.2)
Total Protein: 7 g/dL (ref 6.0–8.3)

## 2019-01-09 LAB — LIPID PANEL
Cholesterol: 150 mg/dL (ref 0–200)
HDL: 56.9 mg/dL (ref >39.00)
LDL Cholesterol: 68 mg/dL (ref 0–99)
NonHDL: 93.35
Total CHOL/HDL Ratio: 3
Triglycerides: 126 mg/dL (ref 0.0–149.0)
VLDL: 25.2 mg/dL (ref 0.0–40.0)

## 2019-01-09 LAB — TSH: TSH: 2.75 u[IU]/mL (ref 0.35–4.50)

## 2019-01-09 MED ORDER — OMEPRAZOLE 20 MG PO CPDR: DELAYED_RELEASE_CAPSULE | ORAL | 1 refills | Status: DC

## 2019-01-09 MED ORDER — IBUPROFEN 800 MG PO TABS: 800.0000 mg | ORAL_TABLET | Freq: Four times a day (QID) | ORAL | 2 refills | Status: DC | PRN

## 2019-01-09 NOTE — Patient Instructions (Signed)
Preventive Care 21-31 Years Old, Female Preventive care refers to visits with your health care provider and lifestyle choices that can promote health and wellness. This includes:  A yearly physical exam. This may also be called an annual well check.  Regular dental visits and eye exams.  Immunizations.  Screening for certain conditions.  Healthy lifestyle choices, such as eating a healthy diet, getting regular exercise, not using drugs or products that contain nicotine and tobacco, and limiting alcohol use. What can I expect for my preventive care visit? Physical exam Your health care provider will check your:  Height and weight. This may be used to calculate body mass index (BMI), which tells if you are at a healthy weight.  Heart rate and blood pressure.  Skin for abnormal spots. Counseling Your health care provider may ask you questions about your:  Alcohol, tobacco, and drug use.  Emotional well-being.  Home and relationship well-being.  Sexual activity.  Eating habits.  Work and work environment.  Method of birth control.  Menstrual cycle.  Pregnancy history. What immunizations do I need?  Influenza (flu) vaccine  This is recommended every year. Tetanus, diphtheria, and pertussis (Tdap) vaccine  You may need a Td booster every 10 years. Varicella (chickenpox) vaccine  You may need this if you have not been vaccinated. Human papillomavirus (HPV) vaccine  If recommended by your health care provider, you may need three doses over 6 months. Measles, mumps, and rubella (MMR) vaccine  You may need at least one dose of MMR. You may also need a second dose. Meningococcal conjugate (MenACWY) vaccine  One dose is recommended if you are age 19-21 years and a first-year college student living in a residence hall, or if you have one of several medical conditions. You may also need additional booster doses. Pneumococcal conjugate (PCV13) vaccine  You may need  this if you have certain conditions and were not previously vaccinated. Pneumococcal polysaccharide (PPSV23) vaccine  You may need one or two doses if you smoke cigarettes or if you have certain conditions. Hepatitis A vaccine  You may need this if you have certain conditions or if you travel or work in places where you may be exposed to hepatitis A. Hepatitis B vaccine  You may need this if you have certain conditions or if you travel or work in places where you may be exposed to hepatitis B. Haemophilus influenzae type b (Hib) vaccine  You may need this if you have certain conditions. You may receive vaccines as individual doses or as more than one vaccine together in one shot (combination vaccines). Talk with your health care provider about the risks and benefits of combination vaccines. What tests do I need?  Blood tests  Lipid and cholesterol levels. These may be checked every 5 years starting at age 20.  Hepatitis C test.  Hepatitis B test. Screening  Diabetes screening. This is done by checking your blood sugar (glucose) after you have not eaten for a while (fasting).  Sexually transmitted disease (STD) testing.  BRCA-related cancer screening. This may be done if you have a family history of breast, ovarian, tubal, or peritoneal cancers.  Pelvic exam and Pap test. This may be done every 3 years starting at age 21. Starting at age 30, this may be done every 5 years if you have a Pap test in combination with an HPV test. Talk with your health care provider about your test results, treatment options, and if necessary, the need for more tests.   Follow these instructions at home: Eating and drinking   Eat a diet that includes fresh fruits and vegetables, whole grains, lean protein, and low-fat dairy.  Take vitamin and mineral supplements as recommended by your health care provider.  Do not drink alcohol if: ? Your health care provider tells you not to drink. ? You are  pregnant, may be pregnant, or are planning to become pregnant.  If you drink alcohol: ? Limit how much you have to 0-1 drink a day. ? Be aware of how much alcohol is in your drink. In the [place] one drink equals one 12 oz bottle of beer (355 mL), one 5 oz glass of wine (148 mL), or one 1 oz glass of hard liquor (44 mL). Lifestyle  Take daily care of your teeth and gums.  Stay active. Exercise for at least 30 minutes on 5 or more days each week.  Do not use any products that contain nicotine or tobacco, such as cigarettes, e-cigarettes, and chewing tobacco. If you need help quitting, ask your health care provider.  If you are sexually active, practice safe sex. Use a condom or other form of birth control (contraception) in order to prevent pregnancy and STIs (sexually transmitted infections). If you plan to become pregnant, see your health care provider for a preconception visit. What's next?  Visit your health care provider once a year for a well check visit.  Ask your health care provider how often you should have your eyes and teeth checked.  Stay up to date on all vaccines. This information is not intended to replace advice given to you by your health care provider. Make sure you discuss any questions you have with your health care provider. Document Released: 08/03/2001 Document Revised: 02/16/2018 Document Reviewed: 02/16/2018 Elsevier Patient Education  2020 Elsevier Inc.  

## 2019-01-09 NOTE — Progress Notes (Signed)
Subjective:     Gina Cameron is a 33 y.o. female and is here for a comprehensive physical exam. The patient reports no problems. She will need refills   Social History   Socioeconomic History  . Marital status: Single    Spouse name: Not on file  . Number of children: Not on file  . Years of education: Not on file  . Highest education level: Not on file  Occupational History  . Occupation: disabled  Social Needs  . Financial resource strain: Not on file  . Food insecurity    Worry: Not on file    Inability: Not on file  . Transportation needs    Medical: Not on file    Non-medical: Not on file  Tobacco Use  . Smoking status: Former Smoker    Packs/day: 0.10    Years: 2.00    Pack years: 0.20    Types: Cigarettes    Quit date: 09/06/2012    Years since quitting: 6.3  . Smokeless tobacco: Never Used  Substance and Sexual Activity  . Alcohol use: Yes    Comment: Socially  . Drug use: Yes    Types: Marijuana  . Sexual activity: Yes    Birth control/protection: Pill  Lifestyle  . Physical activity    Days per week: Not on file    Minutes per session: Not on file  . Stress: Not on file  Relationships  . Social Herbalist on phone: Not on file    Gets together: Not on file    Attends religious service: Not on file    Active member of club or organization: Not on file    Attends meetings of clubs or organizations: Not on file    Relationship status: Not on file  . Intimate partner violence    Fear of current or ex partner: Not on file    Emotionally abused: Not on file    Physically abused: Not on file    Forced sexual activity: Not on file  Other Topics Concern  . Not on file  Social History Narrative  . Not on file   Health Maintenance  Topic Date Due  . INFLUENZA VACCINE  01/20/2019  . PAP SMEAR-Modifier  03/29/2021  . TETANUS/TDAP  07/02/2027  . 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 She does not have any pertinent problems on file. She  has a past surgical history that includes Cholecystectomy; Wisdom tooth extraction; and Ovarian cyst removal. Her family history includes Depression in her father; Diabetes in her maternal grandmother; Heart disease in her mother; Hyperlipidemia in her father and maternal grandmother; Hypertension in her father, maternal grandmother, and mother. She  reports that she quit smoking about 6 years ago. Her smoking use included cigarettes. She has a 0.20 pack-year smoking history. She has never used smokeless tobacco. She reports current alcohol use. She reports current drug use. Drug: Marijuana. She has a current medication list which includes the following prescription(s): albuterol, betamethasone valerate ointment, cetirizine, fluoxetine, ibuprofen, levocetirizine, meloxicam, multiple vitamin, omeprazole, rizatriptan, topiramate, and vitamin d (ergocalciferol). Current Outpatient Medications on File Prior to Visit  Medication Sig Dispense Refill  . albuterol (  PROVENTIL HFA;VENTOLIN HFA) 108 (90 Base) MCG/ACT inhaler Inhale 2 puffs into the lungs every 4 (four) hours.    . betamethasone valerate ointment (VALISONE) 0.1 % Apply 1 application topically 2 (two) times daily.    . cetirizine (ZYRTEC) 10 MG tablet Take 1 tablet by mouth daily.    Marland Kitchen FLUoxetine (PROZAC) 20 MG capsule Take 20 mg by mouth daily.     Marland Kitchen ibuprofen (ADVIL,MOTRIN) 800 MG tablet Take 1 tablet by mouth every 6 (six) hours as needed for pain.    Marland Kitchen levocetirizine (XYZAL) 5 MG tablet TAKE 2 TABLETS(10 MG) BY MOUTH EVERY MORNING 180 tablet 0  . meloxicam (MOBIC) 15 MG tablet Take 1  tablet (15 mg total) by mouth daily. 30 tablet 2  . Multiple Vitamin (MULTIVITAMINS PO) Take by mouth.    . rizatriptan (MAXALT-MLT) 10 MG disintegrating tablet Take 1 tablet by mouth as needed.    . topiramate (TOPAMAX) 100 MG tablet Take 1 tablet (100 mg total) by mouth 2 (two) times daily. 28 tablet 0  . Vitamin D, Ergocalciferol, (DRISDOL) 1.25 MG (50000 UT) CAPS capsule Take 1 capsule (50,000 Units total) by mouth every 7 (seven) days. 4 capsule 0   No current facility-administered medications on file prior to visit.    She is allergic to lidocaine-epinephrine; mepivacaine hcl; diphenhydramine hcl; penicillins; and latex..  Review of Systems Review of Systems  Constitutional: Negative for activity change, appetite change and fatigue.  HENT: Negative for hearing loss, congestion, tinnitus and ear discharge.  dentist q48m Eyes: Negative for visual disturbance (see optho q1y -- vision corrected to 20/20 with glasses).  Respiratory: Negative for cough, chest tightness and shortness of breath.   Cardiovascular: Negative for chest pain, palpitations and leg swelling.  Gastrointestinal: Negative for abdominal pain, diarrhea, constipation and abdominal distention.  Genitourinary: Negative for urgency, frequency, decreased urine volume and difficulty urinating.  Musculoskeletal: Negative for back pain, arthralgias and gait problem.  Skin: Negative for color change, pallor and rash.  Neurological: Negative for dizziness, light-headedness, numbness and headaches.  Hematological: Negative for adenopathy. Does not bruise/bleed easily.  Psychiatric/Behavioral: Negative for suicidal ideas, confusion, sleep disturbance, self-injury, dysphoric mood, decreased concentration and agitation.       Objective:    BP 124/75 (BP Location: Left Arm, Patient Position: Sitting, Cuff Size: Normal)   Pulse 66   Temp 98.1 F (36.7 C) (Oral)   Resp 18   Ht 5\' 1"  (1.549 m)   Wt 226 lb 6.4 oz (102.7 kg)    SpO2 100%   BMI 42.78 kg/m  General appearance: alert, cooperative, appears stated age and no distress Head: Normocephalic, without obvious abnormality, atraumatic Eyes: conjunctivae/corneas clear. PERRL, EOM's intact. Fundi benign. Ears: normal TM's and external ear canals both ears Nose: Nares normal. Septum midline. Mucosa normal. No drainage or sinus tenderness. Throat: lips, mucosa, and tongue normal; teeth and gums normal Neck: no adenopathy, no carotid bruit, no JVD, supple, symmetrical, trachea midline and thyroid not enlarged, symmetric, no tenderness/mass/nodules Back: symmetric, no curvature. ROM normal. No CVA tenderness. Lungs: clear to auscultation bilaterally Breasts: gyn Heart: S1, S2 normal Abdomen: soft, non-tender; bowel sounds normal; no masses,  no organomegaly Pelvic: deferred Extremities: extremities normal, atraumatic, no cyanosis or edema Pulses: 2+ and symmetric Skin: Skin color, texture, turgor normal. No rashes or lesions Lymph nodes: Cervical, supraclavicular, and axillary nodes normal. Neurologic: Alert and oriented X 3, normal strength and tone. Normal symmetric reflexes. Normal coordination and gait    Assessment:  Healthy female exam.      Plan:    ghm utd Check labs  See After Visit Summary for Counseling Recommendations    1. Gastroesophageal reflux disease, esophagitis presence not specified Stable con't meds  - omeprazole (PRILOSEC) 20 MG capsule; TAKE 1 CAPSULE(20 MG) BY MOUTH DAILY  Dispense: 90 capsule; Refill: 1  2. Preventative health care See above  - CBC with Differential/Platelet - Lipid panel - TSH - Comprehensive metabolic panel  3. Irregular periods  - CBC with Differential/Platelet  4. Dysmenorrhea Stable   - ibuprofen (ADVIL) 800 MG tablet; Take 1 tablet (800 mg total) by mouth every 6 (six) hours as needed.  Dispense: 30 tablet; Refill: 2

## 2019-02-01 ENCOUNTER — Other Ambulatory Visit: Payer: Self-pay

## 2019-02-01 ENCOUNTER — Telehealth: Payer: Medicare Other

## 2019-02-01 ENCOUNTER — Encounter (INDEPENDENT_AMBULATORY_CARE_PROVIDER_SITE_OTHER): Payer: Self-pay | Admitting: Family Medicine

## 2019-02-01 DIAGNOSIS — Z20822 Contact with and (suspected) exposure to covid-19: Secondary | ICD-10-CM

## 2019-02-02 LAB — NOVEL CORONAVIRUS, NAA: SARS-CoV-2, NAA: NOT DETECTED

## 2019-02-04 ENCOUNTER — Encounter (INDEPENDENT_AMBULATORY_CARE_PROVIDER_SITE_OTHER): Payer: Self-pay | Admitting: Family Medicine

## 2019-02-07 ENCOUNTER — Other Ambulatory Visit: Payer: Self-pay | Admitting: *Deleted

## 2019-02-07 DIAGNOSIS — N946 Dysmenorrhea, unspecified: Secondary | ICD-10-CM

## 2019-02-07 MED ORDER — IBUPROFEN 800 MG PO TABS: 800.0000 mg | ORAL_TABLET | Freq: Four times a day (QID) | ORAL | 2 refills | Status: DC | PRN

## 2019-03-05 DIAGNOSIS — Z1159 Encounter for screening for other viral diseases: Secondary | ICD-10-CM | POA: Diagnosis not present

## 2019-03-06 ENCOUNTER — Ambulatory Visit (INDEPENDENT_AMBULATORY_CARE_PROVIDER_SITE_OTHER): Payer: Medicare Other | Admitting: *Deleted

## 2019-03-06 ENCOUNTER — Other Ambulatory Visit: Payer: Self-pay

## 2019-03-06 DIAGNOSIS — Z23 Encounter for immunization: Secondary | ICD-10-CM | POA: Diagnosis not present

## 2019-03-06 NOTE — Progress Notes (Signed)
Patient here for flu shot.   Vaccine given and patient tolerated well.

## 2019-03-19 ENCOUNTER — Encounter: Payer: Self-pay | Admitting: Family Medicine

## 2019-03-20 ENCOUNTER — Ambulatory Visit: Payer: Self-pay

## 2019-03-20 NOTE — Telephone Encounter (Signed)
Outgoing call to Patient who complains of Sx of UT. Reports frequeccy an burning.  Denies fever an flank pain.  Has a standing appointment  Monday with Dr. Lockie Mola. Complains of pain when Urinating.  And burning.   And frequenccy.  Reports pain every time she urinates. Sx for 2days  Vaginal discharge is present.  Suggested to drinkk plenty of fluids . Use of AZo Urinary suppliment. With cranberry Juice.  Until visit with Dr.  [name].                [name], 33 y.o., 04-Oct-1985 MRN:  [id number] Phone:  [phone] (M) ... PCP:  Ann Held, DO Primary Cvg:  Medicare/Medicare Part A And B Next Appt With Family Medicine 03/26/2019 at 11:00 AM Message from Mathis Bud sent at 03/20/2019 3:43 PM EDT  Summary: possible UTI   Patient believes she has a bladder infection or possible UTI.  Patient cannot see PCP until Monday 10/5 and would like to know if she could get anything over the counter  Call back [phone]         Call History   Type Contact Phone User  03/20/2019 03:42 PM EDT Phone ([address] Zannie, Eurich R (Self) [phone] Lemmie Evens) Mathis Bud    Reason for Disposition . Patient is worried about sexually transmitted disease (STD)  Answer Assessment - Initial Assessment Questions 1. SEVERITY: "How bad is the pain?"  (e.g., Scale 1-10; mild, moderate, or severe)   - MILD (1-3): complains slightly about urination hurting   - MODERATE (4-7): interferes with normal activities     - SEVERE (8-10): excruciating, unwilling or unable to urinate because of the pain   hurts mild took advil  drinking a lot of water  cranderry juice 2. FREQUENCY: "How many times have you had painful urination today?"      Yes  3. PATTERN: "Is pain present every time you urinate or just sometimes?"      yes 4. ONSET: "When did the painful urination start?"      2 days 5. FEVER: "Do you have a fever?" If so, ask: "What is your temperature,  how was it measured, and when did it start?"     denies 6. PAST UTI: "Have you had a urine infection before?" If so, ask: "When was the last time?" and "What happened that time?"      Antibiotics  7. CAUSE: "What do you think is causing the painful urination?"  (e.g., UTI, scratch, Herpes sore)     Drank a lot of coffee 8. OTHER SYMPTOMS: "Do you have any other symptoms?" (e.g., flank pain, vaginal discharge, genital sores, urgency, blood in urine)    vaginal dischare 9. PREGNANCY: "Is there any chance you are pregnant?" "When was your last menstrual period?"     Just ended yesteday  Protocols used: [place]

## 2019-03-21 NOTE — Telephone Encounter (Signed)
Lvm for patient to call back about this, she may be able to do a virtual visit.

## 2019-03-26 ENCOUNTER — Ambulatory Visit: Payer: Medicare Other | Admitting: Family Medicine

## 2019-04-06 ENCOUNTER — Ambulatory Visit (HOSPITAL_COMMUNITY)
Admission: EM | Admit: 2019-04-06 | Discharge: 2019-04-06 | Disposition: A | Discharge status: Home / Self Care | Payer: Medicare Other | Attending: Family Medicine | Admitting: Family Medicine

## 2019-04-06 ENCOUNTER — Encounter (HOSPITAL_COMMUNITY): Payer: Self-pay

## 2019-04-06 DIAGNOSIS — U071 COVID-19: Secondary | ICD-10-CM | POA: Insufficient documentation

## 2019-04-06 DIAGNOSIS — Z20828 Contact with and (suspected) exposure to other viral communicable diseases: Secondary | ICD-10-CM

## 2019-04-06 DIAGNOSIS — J069 Acute upper respiratory infection, unspecified: Secondary | ICD-10-CM | POA: Diagnosis not present

## 2019-04-06 DIAGNOSIS — Z20822 Contact with and (suspected) exposure to covid-19: Secondary | ICD-10-CM

## 2019-04-06 NOTE — Discharge Instructions (Addendum)
Rest.  Push fluids.  Take Tylenol as needed for any pain or fever You may take over-the-counter cough and cold medicines like Robitussin-DM Quarantine at home until your test result is available You will be called for any positive test results.     Person Under Monitoring Name: Gina Cameron  Location: [place] [address]   Infection Prevention Recommendations for Individuals Confirmed to have, or Being Evaluated for, 2019 Novel Coronavirus (COVID-19) Infection Who Receive Care at Home  Individuals who are confirmed to have, or are being evaluated for, COVID-19 should follow the prevention steps below until a healthcare provider or local or state health department says they can return to normal activities.  Stay home except to get medical care You should restrict activities outside your home, except for getting medical care. Do not go to work, school, or public areas, and do not use public transportation or taxis.  Call ahead before visiting your doctor Before your medical appointment, call the healthcare provider and tell them that you have, or are being evaluated for, COVID-19 infection. This will help the healthcare providers office take steps to keep other people from getting infected. Ask your healthcare provider to call the local or state health department.  Monitor your symptoms Seek prompt medical attention if your illness is worsening (e.g., difficulty breathing). Before going to your medical appointment, call the healthcare provider and tell them that you have, or are being evaluated for, COVID-19 infection. Ask your healthcare provider to call the local or state health department.  Wear a facemask You should wear a facemask that covers your nose and mouth when you are in the same room with other people and when you visit a healthcare provider. People who live with or visit you should also wear a facemask while they are in the same room  with you.  Separate yourself from other people in your home As much as possible, you should stay in a different room from other people in your home. Also, you should use a separate bathroom, if available.  Avoid sharing household items You should not share dishes, drinking glasses, cups, eating utensils, towels, bedding, or other items with other people in your home. After using these items, you should wash them thoroughly with soap and water.  Cover your coughs and sneezes Cover your mouth and nose with a tissue when you cough or sneeze, or you can cough or sneeze into your sleeve. Throw used tissues in a lined trash can, and immediately wash your hands with soap and water for at least 20 seconds or use an alcohol-based hand rub.  Wash your [facility] your hands often and thoroughly with soap and water for at least 20 seconds. You can use an alcohol-based hand sanitizer if soap and water are not available and if your hands are not visibly dirty. Avoid touching your eyes, nose, and mouth with unwashed hands.   Prevention Steps for Caregivers and Household Members of Individuals Confirmed to have, or Being Evaluated for, COVID-19 Infection Being Cared for in the Home  If you live with, or provide care at home for, a person confirmed to have, or being evaluated for, COVID-19 infection please follow these guidelines to prevent infection:  Follow healthcare providers instructions Make sure that you understand and can help the patient follow any healthcare provider instructions for all care.  Provide for the patients basic needs You should help the patient with basic needs in the home and provide  support for getting groceries, prescriptions, and other personal needs.  Monitor the patients symptoms If they are getting sicker, call his or her medical provider and tell them that the patient has, or is being evaluated for, COVID-19 infection. This will help the healthcare providers  office take steps to keep other people from getting infected. Ask the healthcare provider to call the local or state health department.  Limit the number of people who have contact with the patient If possible, have only one caregiver for the patient. Other household members should stay in another home or place of residence. If this is not possible, they should stay in another room, or be separated from the patient as much as possible. Use a separate bathroom, if available. Restrict visitors who do not have an essential need to be in the home.  Keep older adults, very young children, and other sick people away from the patient Keep older adults, very young children, and those who have compromised immune systems or chronic health conditions away from the patient. This includes people with chronic heart, lung, or kidney conditions, diabetes, and cancer.  Ensure good ventilation Make sure that shared spaces in the home have good air flow, such as from an air conditioner or an opened window, weather permitting.  Wash your hands often Wash your hands often and thoroughly with soap and water for at least 20 seconds. You can use an alcohol based hand sanitizer if soap and water are not available and if your hands are not visibly dirty. Avoid touching your eyes, nose, and mouth with unwashed hands. Use disposable paper towels to dry your hands. If not available, use dedicated cloth towels and replace them when they become wet.  Wear a facemask and gloves Wear a disposable facemask at all times in the room and gloves when you touch or have contact with the patients blood, body fluids, and/or secretions or excretions, such as sweat, saliva, sputum, nasal mucus, vomit, urine, or feces.  Ensure the mask fits over your nose and mouth tightly, and do not touch it during use. Throw out disposable facemasks and gloves after using them. Do not reuse. Wash your hands immediately after removing your facemask  and gloves. If your personal clothing becomes contaminated, carefully remove clothing and launder. Wash your hands after handling contaminated clothing. Place all used disposable facemasks, gloves, and other waste in a lined container before disposing them with other household waste. Remove gloves and wash your hands immediately after handling these items.  Do not share dishes, glasses, or other household items with the patient Avoid sharing household items. You should not share dishes, drinking glasses, cups, eating utensils, towels, bedding, or other items with a patient who is confirmed to have, or being evaluated for, COVID-19 infection. After the person uses these items, you should wash them thoroughly with soap and water.  Wash laundry thoroughly Immediately remove and wash clothes or bedding that have blood, body fluids, and/or secretions or excretions, such as sweat, saliva, sputum, nasal mucus, vomit, urine, or feces, on them. Wear gloves when handling laundry from the patient. Read and follow directions on labels of laundry or clothing items and detergent. In general, wash and dry with the warmest temperatures recommended on the label.  Clean all areas the individual has used often Clean all touchable surfaces, such as counters, tabletops, doorknobs, bathroom fixtures, toilets, phones, keyboards, tablets, and bedside tables, every day. Also, clean any surfaces that may have blood, body fluids, and/or secretions or excretions  on them. Wear gloves when cleaning surfaces the patient has come in contact with. Use a diluted bleach solution (e.g., dilute bleach with 1 part bleach and 10 parts water) or a household disinfectant with a label that says EPA-registered for coronaviruses. To make a bleach solution at home, add 1 tablespoon of bleach to 1 quart (4 cups) of water. For a larger supply, add  cup of bleach to 1 gallon (16 cups) of water. Read labels of cleaning products and follow  recommendations provided on product labels. Labels contain instructions for safe and effective use of the cleaning product including precautions you should take when applying the product, such as wearing gloves or eye protection and making sure you have good ventilation during use of the product. Remove gloves and wash hands immediately after cleaning.  Monitor yourself for signs and symptoms of illness Caregivers and household members are considered close contacts, should monitor their health, and will be asked to limit movement outside of the home to the extent possible. Follow the monitoring steps for close contacts listed on the symptom monitoring form.   ? If you have additional questions, contact your local health department or call the epidemiologist on call at [phone] (available 24/7). ? This guidance is subject to change. For the most up-to-date guidance from [facility], please refer to their website: [email or web address]

## 2019-04-06 NOTE — ED Triage Notes (Signed)
Pt states she is having sinus pressure, headache and cough x 3 days. Pt states she is taking Robitussin and OTC cold and sinus relieve medication, relieve her symptoms.

## 2019-04-06 NOTE — ED Provider Notes (Signed)
[place]    CSN: [id number] Arrival date & time: 04/06/19  1049      History   Chief Complaint Chief Complaint  Patient presents with  . Sinus Problem  . Headache  . Cough    HPI Gina Cameron is a 33 y.o. female.   HPI  Patient is here with sinus congestion.  Runny nose.  Sinus pressure.  Mild sore throat.  Mild headache.  Cough from the postnasal drip.  Fatigue.  No fever or chills.  Patient states she is been sick for 2 to 3 days.  She is here with her mother.  Mother is sick as well.  Father is currently in the hospital with a positive Covid.  He is on oxygen.  She has had close exposure to father.  Past Medical History:  Diagnosis Date  . Anxiety   . Bipolar 2 disorder ([place])   . Constipation   . Depression   . GERD (gastroesophageal reflux disease)   . Hives   . Joint pain   . Leg edema   . Migraine   . PTSD (post-traumatic stress disorder)   . Reflux   . Seizures ([place])   . Sleep apnea   . 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 Developmental delay 02/23/2018  . Seizures ([place]) 02/23/2018  . Hirsutism 02/23/2018  . Cyst of ovary 02/23/2018  . Complex tear of medial meniscus of right knee as current injury 03/01/2016  . 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 History of brain disorder 08/06/2014  . Migraine   . GERD 01/14/2010  . OBSTRUCTIVE SLEEP APNEA 10/30/2009  . Migraine without aura 06/10/2009  . ECZEMA 03/05/2009  . DEPRESSION 07/24/2008  . MILD MENTAL RETARDATION 01/15/2008  . 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 MORBID OBESITY 01/10/2008    Past Surgical History:  Procedure Laterality Date  . CHOLECYSTECTOMY    . OVARIAN CYST REMOVAL    . WISDOM TOOTH EXTRACTION      OB History    Gravida  0   Para  0   Term  0   Preterm  0   AB  0   Living  0     SAB  0   TAB  0   Ectopic  0   Multiple  0   Live  Births  0            Home Medications    Prior to Admission medications   Medication Sig Start Date End Date Taking? Authorizing Provider  Dextromethorphan HBr (ROBITUSSIN COUGHGELS PO) Take by mouth.   Yes [provider]  amitriptyline (ELAVIL) 10 MG tablet TK 1 T PO QHS 03/02/19   [provider]  betamethasone valerate ointment (VALISONE) 0.1 % Apply 1 application topically 2 (two) times daily.    [provider]  FLUoxetine (PROZAC) 20 MG capsule Take 20 mg by mouth daily.  01/06/19   [provider]  ibuprofen (ADVIL) 800 MG tablet Take 1 tablet (800 mg total) by mouth every 6 (six) hours as needed. 02/07/19   Ann Held, DO  levocetirizine (XYZAL) 5 MG tablet TAKE 2 TABLETS(10 MG) BY MOUTH EVERY MORNING 11/14/18   Carollee Herter, Alferd Apa, DO  meloxicam (MOBIC) 15 MG tablet Take 1 tablet (15 mg total) by mouth daily. 04/20/18   Dene Gentry, MD  Multiple Vitamin (MULTIVITAMINS PO) Take  by mouth.    [provider]  omeprazole (PRILOSEC) 20 MG capsule TAKE 1 CAPSULE(20 MG) BY MOUTH DAILY 01/09/19   Carollee Herter, Kendrick Fries R, DO  rizatriptan (MAXALT-MLT) 10 MG disintegrating tablet Take 1 tablet by mouth as needed. 08/10/17   [provider]  topiramate (TOPAMAX) 100 MG tablet Take 1 tablet (100 mg total) by mouth 2 (two) times daily. 04/13/18   Carlisle Cater, PA-C  Vitamin D, Ergocalciferol, (DRISDOL) 1.25 MG (50000 UT) CAPS capsule Take 1 capsule (50,000 Units total) by mouth every 7 (seven) days. 05/24/18   Starlyn Skeans, MD    Family History Family History  Problem Relation Age of Onset  . Hypertension Father   . Hyperlipidemia Father   . Depression Father   . Hypertension Mother   . Heart disease Mother   . Hypertension Maternal Grandmother   . Diabetes Maternal Grandmother   . Hyperlipidemia Maternal Grandmother     Social History Social History   Tobacco Use  . Smoking status: Former Smoker     Packs/day: 0.10    Years: 2.00    Pack years: 0.20    Types: Cigarettes    Quit date: 09/06/2012    Years since quitting: 6.5  . Smokeless tobacco: Never Used  Substance Use Topics  . Alcohol use: Yes    Comment: Socially  . Drug use: Yes    Types: Marijuana     Allergies   Lidocaine-epinephrine, Mepivacaine hcl, Diphenhydramine hcl, Penicillins, and Latex   Review of Systems Review of Systems  Constitutional: Negative for chills, fatigue and fever.  HENT: Positive for congestion, postnasal drip, rhinorrhea, sinus pressure and sinus pain. Negative for ear pain and sore throat.   Eyes: Negative for pain and visual disturbance.  Respiratory: Negative for cough and shortness of breath.   Cardiovascular: Negative for chest pain and palpitations.  Gastrointestinal: Negative for abdominal pain and vomiting.  Genitourinary: Negative for dysuria and hematuria.  Musculoskeletal: Negative for arthralgias, back pain and myalgias.  Skin: Negative for color change and rash.  Neurological: Positive for headaches. Negative for seizures and syncope.  All other systems reviewed and are negative.    Physical Exam Triage Vital Signs ED Triage Vitals  Enc Vitals Group     BP 04/06/19 1118 131/90     Pulse Rate 04/06/19 1118 89     Resp 04/06/19 1118 17     Temp 04/06/19 1118 100 F (37.8 C)     Temp Source 04/06/19 1118 Temporal     SpO2 04/06/19 1118 96 %     Weight --      Height --      Head Circumference --      Peak Flow --      Pain Score 04/06/19 1114 5     Pain Loc --      Pain Edu? --      Excl. in [place]? --    No data found.  Updated Vital Signs BP 131/90 (BP Location: Left Arm)   Pulse 89   Temp 100 F (37.8 C) (Temporal)   Resp 17   SpO2 96%      Physical Exam Constitutional:      General: She is not in acute distress.    Appearance: She is well-developed. She is obese.  HENT:     Head: Normocephalic and atraumatic.     Right Ear: Tympanic membrane and ear  canal normal.     Left Ear: Tympanic membrane and  ear canal normal.     Nose: Rhinorrhea present.     Mouth/Throat:     Mouth: Mucous membranes are moist.     Pharynx: Posterior oropharyngeal erythema present.  Eyes:     Conjunctiva/sclera: Conjunctivae normal.     Pupils: Pupils are equal, round, and reactive to light.  Neck:     Musculoskeletal: Normal range of motion.  Cardiovascular:     Rate and Rhythm: Normal rate and regular rhythm.     Heart sounds: Normal heart sounds.  Pulmonary:     Effort: Pulmonary effort is normal. No respiratory distress.     Breath sounds: Normal breath sounds.  Abdominal:     General: There is no distension.     Palpations: Abdomen is soft.  Musculoskeletal: Normal range of motion.  Lymphadenopathy:     Cervical: Cervical adenopathy present.  Skin:    General: Skin is warm and dry.  Neurological:     General: No focal deficit present.     Mental Status: She is alert.  Psychiatric:        Behavior: Behavior normal.     Comments: Mildly slow speech      UC Treatments / Results  Labs (all labs ordered are listed, but only abnormal results are displayed) Labs Reviewed  NOVEL CORONAVIRUS, NAA (HOSP ORDER, SEND-OUT TO REF LAB; TAT 18-24 HRS)    EKG   Radiology No results found.  Procedures Procedures (including critical care time)  Medications Ordered in UC Medications - No data to display  Initial Impression / Assessment and Plan / UC Course  I have reviewed the triage vital signs and the nursing notes.  Pertinent labs & imaging results that were available during my care of the patient were reviewed by me and considered in my medical decision making (see chart for details).     Since patient has some mental issues, I discussed discharge instructions with both she and mother.  Covid quarantine is recommended. Final Clinical Impressions(s) / UC Diagnoses   Final diagnoses:  Close exposure to COVID-19 virus  Viral upper  respiratory tract infection     Discharge Instructions     Rest.  Push fluids.  Take Tylenol as needed for any pain or fever You may take over-the-counter cough and cold medicines like Robitussin-DM Quarantine at home until your test result is available You will be called for any positive test results.     Person Under Monitoring Name: ROSALAND ALTIMARI  Location: [place] [address]   Infection Prevention Recommendations for Individuals Confirmed to have, or Being Evaluated for, 2019 Novel Coronavirus (COVID-19) Infection Who Receive Care at Home  Individuals who are confirmed to have, or are being evaluated for, COVID-19 should follow the prevention steps below until a healthcare provider or local or state health department says they can return to normal activities.  Stay home except to get medical care You should restrict activities outside your home, except for getting medical care. Do not go to work, school, or public areas, and do not use public transportation or taxis.  Call ahead before visiting your doctor Before your medical appointment, call the healthcare provider and tell them that you have, or are being evaluated for, COVID-19 infection. This will help the healthcare provider's office take steps to keep other people from getting infected. Ask your healthcare provider to call the local or state health department.  Monitor your symptoms Seek prompt medical attention if your illness is  worsening (e.g., difficulty breathing). Before going to your medical appointment, call the healthcare provider and tell them that you have, or are being evaluated for, COVID-19 infection. Ask your healthcare provider to call the local or state health department.  Wear a facemask You should wear a facemask that covers your nose and mouth when you are in the same room with other people and when you visit a healthcare provider. People who live with or visit  you should also wear a facemask while they are in the same room with you.  Separate yourself from other people in your home As much as possible, you should stay in a different room from other people in your home. Also, you should use a separate bathroom, if available.  Avoid sharing household items You should not share dishes, drinking glasses, cups, eating utensils, towels, bedding, or other items with other people in your home. After using these items, you should wash them thoroughly with soap and water.  Cover your coughs and sneezes Cover your mouth and nose with a tissue when you cough or sneeze, or you can cough or sneeze into your sleeve. Throw used tissues in a lined trash can, and immediately wash your hands with soap and water for at least 20 seconds or use an alcohol-based hand rub.  Wash your [facility] your hands often and thoroughly with soap and water for at least 20 seconds. You can use an alcohol-based hand sanitizer if soap and water are not available and if your hands are not visibly dirty. Avoid touching your eyes, nose, and mouth with unwashed hands.   Prevention Steps for Caregivers and Household Members of Individuals Confirmed to have, or Being Evaluated for, COVID-19 Infection Being Cared for in the Home  If you live with, or provide care at home for, a person confirmed to have, or being evaluated for, COVID-19 infection please follow these guidelines to prevent infection:  Follow healthcare provider's instructions Make sure that you understand and can help the patient follow any healthcare provider instructions for all care.  Provide for the patient's basic needs You should help the patient with basic needs in the home and provide support for getting groceries, prescriptions, and other personal needs.  Monitor the patient's symptoms If they are getting sicker, call his or her medical provider and tell them that the patient has, or is being evaluated for,  COVID-19 infection. This will help the healthcare provider's office take steps to keep other people from getting infected. Ask the healthcare provider to call the local or state health department.  Limit the number of people who have contact with the patient  If possible, have only one caregiver for the patient.  Other household members should stay in another home or place of residence. If this is not possible, they should stay  in another room, or be separated from the patient as much as possible. Use a separate bathroom, if available.  Restrict visitors who do not have an essential need to be in the home.  Keep older adults, very young children, and other sick people away from the patient Keep older adults, very young children, and those who have compromised immune systems or chronic health conditions away from the patient. This includes people with chronic heart, lung, or kidney conditions, diabetes, and cancer.  Ensure good ventilation Make sure that shared spaces in the home have good air flow, such as from an air conditioner or an opened window, weather permitting.  Wash your hands often  Wash your hands often and thoroughly with soap and water for at least 20 seconds. You can use an alcohol based hand sanitizer if soap and water are not available and if your hands are not visibly dirty.  Avoid touching your eyes, nose, and mouth with unwashed hands.  Use disposable paper towels to dry your hands. If not available, use dedicated cloth towels and replace them when they become wet.  Wear a facemask and gloves  Wear a disposable facemask at all times in the room and gloves when you touch or have contact with the patient's blood, body fluids, and/or secretions or excretions, such as sweat, saliva, sputum, nasal mucus, vomit, urine, or feces.  Ensure the mask fits over your nose and mouth tightly, and do not touch it during use.  Throw out disposable facemasks and gloves after using  them. Do not reuse.  Wash your hands immediately after removing your facemask and gloves.  If your personal clothing becomes contaminated, carefully remove clothing and launder. Wash your hands after handling contaminated clothing.  Place all used disposable facemasks, gloves, and other waste in a lined container before disposing them with other household waste.  Remove gloves and wash your hands immediately after handling these items.  Do not share dishes, glasses, or other household items with the patient  Avoid sharing household items. You should not share dishes, drinking glasses, cups, eating utensils, towels, bedding, or other items with a patient who is confirmed to have, or being evaluated for, COVID-19 infection.  After the person uses these items, you should wash them thoroughly with soap and water.  Wash laundry thoroughly  Immediately remove and wash clothes or bedding that have blood, body fluids, and/or secretions or excretions, such as sweat, saliva, sputum, nasal mucus, vomit, urine, or feces, on them.  Wear gloves when handling laundry from the patient.  Read and follow directions on labels of laundry or clothing items and detergent. In general, wash and dry with the warmest temperatures recommended on the label.  Clean all areas the individual has used often  Clean all touchable surfaces, such as counters, tabletops, doorknobs, bathroom fixtures, toilets, phones, keyboards, tablets, and bedside tables, every day. Also, clean any surfaces that may have blood, body fluids, and/or secretions or excretions on them.  Wear gloves when cleaning surfaces the patient has come in contact with.  Use a diluted bleach solution (e.g., dilute bleach with 1 part bleach and 10 parts water) or a household disinfectant with a label that says EPA-registered for coronaviruses. To make a bleach solution at home, add 1 tablespoon of bleach to 1 quart (4 cups) of water. For a larger supply,  add  cup of bleach to 1 gallon (16 cups) of water.  Read labels of cleaning products and follow recommendations provided on product labels. Labels contain instructions for safe and effective use of the cleaning product including precautions you should take when applying the product, such as wearing gloves or eye protection and making sure you have good ventilation during use of the product.  Remove gloves and wash hands immediately after cleaning.  Monitor yourself for signs and symptoms of illness Caregivers and household members are considered close contacts, should monitor their health, and will be asked to limit movement outside of the home to the extent possible. Follow the monitoring steps for close contacts listed on the symptom monitoring form.   ? If you have additional questions, contact your local health department or call the epidemiologist on call  at [phone] (available 24/7). ? This guidance is subject to change. For the most up-to-date guidance from [facility], please refer to their website: [email or web address].pl    ED Prescriptions    None     PDMP not reviewed this encounter.   Raylene Everts, MD 04/06/19 [phone]

## 2019-04-08 ENCOUNTER — Other Ambulatory Visit: Payer: Self-pay

## 2019-04-08 ENCOUNTER — Emergency Department (HOSPITAL_COMMUNITY)
Admission: EM | Admit: 2019-04-08 | Discharge: 2019-04-09 | Disposition: A | Discharge status: Home / Self Care | Payer: Medicare Other | Attending: Emergency Medicine | Admitting: Emergency Medicine

## 2019-04-08 ENCOUNTER — Emergency Department (HOSPITAL_COMMUNITY): Payer: Medicare Other

## 2019-04-08 DIAGNOSIS — Z79899 Other long term (current) drug therapy: Secondary | ICD-10-CM | POA: Diagnosis not present

## 2019-04-08 DIAGNOSIS — U071 COVID-19: Secondary | ICD-10-CM | POA: Diagnosis not present

## 2019-04-08 DIAGNOSIS — Z87891 Personal history of nicotine dependence: Secondary | ICD-10-CM | POA: Diagnosis not present

## 2019-04-08 DIAGNOSIS — R112 Nausea with vomiting, unspecified: Secondary | ICD-10-CM | POA: Diagnosis not present

## 2019-04-08 DIAGNOSIS — Z91040 Latex allergy status: Secondary | ICD-10-CM | POA: Insufficient documentation

## 2019-04-08 DIAGNOSIS — R05 Cough: Secondary | ICD-10-CM | POA: Diagnosis not present

## 2019-04-08 DIAGNOSIS — Z20828 Contact with and (suspected) exposure to other viral communicable diseases: Secondary | ICD-10-CM | POA: Diagnosis not present

## 2019-04-08 DIAGNOSIS — R197 Diarrhea, unspecified: Secondary | ICD-10-CM

## 2019-04-08 LAB — CBC
HCT: 44.3 % (ref 36.0–46.0)
Hemoglobin: 14.2 g/dL (ref 12.0–15.0)
MCH: 28.5 pg (ref 26.0–34.0)
MCHC: 32.1 g/dL (ref 30.0–36.0)
MCV: 89 fL (ref 80.0–100.0)
Platelets: 359 10*3/uL (ref 150–400)
RBC: 4.98 MIL/uL (ref 3.87–5.11)
RDW: 14.2 % (ref 11.5–15.5)
WBC: 4.5 10*3/uL (ref 4.0–10.5)
nRBC: 0 % (ref 0.0–0.2)

## 2019-04-08 LAB — COMPREHENSIVE METABOLIC PANEL
ALT: 39 U/L (ref 0–44)
AST: 29 U/L (ref 15–41)
Albumin: 3 g/dL — ABNORMAL LOW (ref 3.5–5.0)
Alkaline Phosphatase: 63 U/L (ref 38–126)
Anion gap: 8 (ref 5–15)
BUN: <5 mg/dL — ABNORMAL LOW (ref 6–20)
CO2: 26 mmol/L (ref 22–32)
Calcium: 8.6 mg/dL — ABNORMAL LOW (ref 8.9–10.3)
Chloride: 106 mmol/L (ref 98–111)
Creatinine, Ser: 0.66 mg/dL (ref 0.44–1.00)
GFR calc Af Amer: >60 mL/min (ref >60)
GFR calc non Af Amer: >60 mL/min (ref >60)
Glucose, Bld: 104 mg/dL — ABNORMAL HIGH (ref 70–99)
Potassium: 3.9 mmol/L (ref 3.5–5.1)
Sodium: 140 mmol/L (ref 135–145)
Total Bilirubin: 0.4 mg/dL (ref 0.3–1.2)
Total Protein: 6.8 g/dL (ref 6.5–8.1)

## 2019-04-08 LAB — LIPASE, BLOOD: Lipase: 31 U/L (ref 11–51)

## 2019-04-08 LAB — I-STAT BETA HCG BLOOD, ED (MC, WL, AP ONLY): I-stat hCG, quantitative: <5 m[IU]/mL (ref <5)

## 2019-04-08 MED ORDER — SODIUM CHLORIDE 0.9% FLUSH: 3.0000 mL | Freq: Once | INTRAVENOUS | Status: DC

## 2019-04-08 NOTE — ED Triage Notes (Signed)
Per pt she was dx with covid x 3 days and is having diarrhea, cough, congestion, nausea. Pt says she is not sob, no chest pain. Pt is taking Robitussin, tylenol sinus. No fevers,

## 2019-04-09 DIAGNOSIS — U071 COVID-19: Secondary | ICD-10-CM | POA: Diagnosis not present

## 2019-04-09 MED ORDER — DEXAMETHASONE SODIUM PHOSPHATE 10 MG/ML IJ SOLN
10.0000 mg | Freq: Once | INTRAMUSCULAR | Status: AC
  Administered 2019-04-09: 10 mg via INTRAMUSCULAR
  Filled 2019-04-09: qty 1

## 2019-04-09 MED ORDER — PROMETHAZINE-DM 6.25-15 MG/5ML PO SYRP: 5.0000 mL | ORAL_SOLUTION | Freq: Four times a day (QID) | ORAL | 0 refills | Status: DC | PRN

## 2019-04-09 MED ORDER — ONDANSETRON 4 MG PO TBDP: 4.0000 mg | ORAL_TABLET | Freq: Three times a day (TID) | ORAL | 0 refills | Status: DC | PRN

## 2019-04-09 NOTE — ED Provider Notes (Addendum)
[facility] EMERGENCY DEPARTMENT Provider Note   CSN: [id number] Arrival date & time: 04/08/19  [id number]     History   Chief Complaint Chief Complaint  Patient presents with  . Nausea    + covid 3 days ago  . Cough  . Diarrhea    HPI Gina Cameron is a 33 y.o. female with a history of Sturge-Weber syndrome, seizures, PTSD, GERD, bipolar 2 disorder, recurrent sinus infections, and anxiety who presents to the emergency department with a chief complaint of nausea, vomiting, diarrhea.  The patient reports that she has been having cough, nasal congestion, and headache for the last 5 days.  She tested positive for COVID-19 on October 16 after she went to urgent care because she thought she was having a sinus infection.  She reports that over the last 24 hours that she has developed nausea, had 2 episodes of nonbloody, nonbilious vomiting, and several episodes of nonbloody loose stools.  She reports that she has no nausea at this time.  Her last episode of vomiting was much earlier today.  She has been able to drink ginger ale without vomiting.  Reports that she has not had any fevers.  She has no complaints of chest pain, shortness of breath, abdominal pain, visual changes, dizziness, lightheadedness.  She has been taking over-the-counter Robitussin and Tylenol Cold and sinus.   She reports that her last seizure was in 2009.  Spoke with the patient's mother who reports that she has a pulse oximeter, thermometer, blood pressure cuff at home.  SHELL ADAMS was evaluated in Emergency Department on 04/09/2019 for the symptoms described in the history of present illness. She was evaluated in the context of the global COVID-19 pandemic, which necessitated consideration that the patient might be at risk for infection with the SARS-CoV-2 virus that causes COVID-19. Institutional protocols and algorithms that pertain to the evaluation of patients at risk for COVID-19 are in a state  of rapid change based on information released by regulatory bodies including the CDC and federal and state organizations. These policies and algorithms were followed during the patient's care in the ED.      The history is provided by the patient. No language interpreter was used.  Diarrhea Associated symptoms: headaches and vomiting   Associated symptoms: no abdominal pain, no chills and no fever     Past Medical History:  Diagnosis Date  . Anxiety   . Bipolar 2 disorder ([place])   . Constipation   . Depression   . GERD (gastroesophageal reflux disease)   . Hives   . Joint pain   . Leg edema   . Migraine   . PTSD (post-traumatic stress disorder)   . Reflux   . Seizures ([place])   . Sleep apnea   . Sturge-Weber syndrome [facility])     Patient Active Problem List   Diagnosis Date Noted  . Developmental delay 02/23/2018  . Seizures ([place]) 02/23/2018  . Hirsutism 02/23/2018  . Cyst of ovary 02/23/2018  . Complex tear of medial meniscus of right knee as current injury 03/01/2016  . 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 History of brain disorder 08/06/2014  . Migraine   . GERD 01/14/2010  . OBSTRUCTIVE SLEEP APNEA 10/30/2009  . Migraine without aura 06/10/2009  . ECZEMA 03/05/2009  . DEPRESSION 07/24/2008  . MILD MENTAL RETARDATION 01/15/2008  . Sturge-Weber syndrome ([place]) 01/15/2008  . Congenital hamartoma ([place]) 01/15/2008  . MORBID OBESITY 01/10/2008    Past Surgical History:  Procedure Laterality Date  . CHOLECYSTECTOMY    . OVARIAN CYST REMOVAL    . WISDOM TOOTH EXTRACTION       OB History    Gravida  0   Para  0   Term  0   Preterm  0   AB  0   Living  0     SAB  0   TAB  0   Ectopic  0   Multiple  0   Live Births  0            Home Medications    Prior to Admission medications   Medication Sig Start Date End Date Taking? Authorizing Provider  amitriptyline (ELAVIL) 10 MG tablet TK 1 T PO QHS 03/02/19   [provider]   betamethasone valerate ointment (VALISONE) 0.1 % Apply 1 application topically 2 (two) times daily.    [provider]  Dextromethorphan HBr (ROBITUSSIN COUGHGELS PO) Take by mouth.    [provider]  FLUoxetine (PROZAC) 20 MG capsule Take 20 mg by mouth daily.  01/06/19   [provider]  ibuprofen (ADVIL) 800 MG tablet Take 1 tablet (800 mg total) by mouth every 6 (six) hours as needed. 02/07/19   Ann Held, DO  levocetirizine (XYZAL) 5 MG tablet TAKE 2 TABLETS(10 MG) BY MOUTH EVERY MORNING 11/14/18   Carollee Herter, Alferd Apa, DO  meloxicam (MOBIC) 15 MG tablet Take 1 tablet (15 mg total) by mouth daily. 04/20/18   Dene Gentry, MD  Multiple Vitamin (MULTIVITAMINS PO) Take by mouth.    [provider]  omeprazole (PRILOSEC) 20 MG capsule TAKE 1 CAPSULE(20 MG) BY MOUTH DAILY 01/09/19   Carollee Herter, Yvonne R, DO  ondansetron (ZOFRAN ODT) 4 MG disintegrating tablet Take 1 tablet (4 mg total) by mouth every 8 (eight) hours as needed. 04/09/19   Kianah Harries A, PA-C  promethazine-dextromethorphan (PROMETHAZINE-DM) 6.25-15 MG/5ML syrup Take 5 mLs by mouth 4 (four) times daily as needed for cough. 04/09/19   Esperanza Madrazo A, PA-C  rizatriptan (MAXALT-MLT) 10 MG disintegrating tablet Take 1 tablet by mouth as needed. 08/10/17   [provider]  topiramate (TOPAMAX) 100 MG tablet Take 1 tablet (100 mg total) by mouth 2 (two) times daily. 04/13/18   Carlisle Cater, PA-C  Vitamin D, Ergocalciferol, (DRISDOL) 1.25 MG (50000 UT) CAPS capsule Take 1 capsule (50,000 Units total) by mouth every 7 (seven) days. 05/24/18   Starlyn Skeans, MD    Family History Family History  Problem Relation Age of Onset  . Hypertension Father   . Hyperlipidemia Father   . Depression Father   . Hypertension Mother   . Heart disease Mother   . Hypertension Maternal Grandmother   . Diabetes Maternal Grandmother   . Hyperlipidemia Maternal Grandmother     Social  History Social History   Tobacco Use  . Smoking status: Former Smoker    Packs/day: 0.10    Years: 2.00    Pack years: 0.20    Types: Cigarettes    Quit date: 09/06/2012    Years since quitting: 6.5  . Smokeless tobacco: Never Used  Substance Use Topics  . Alcohol use: Yes    Comment: Socially  . Drug use: Yes    Types: Marijuana     Allergies   Lidocaine-epinephrine, Mepivacaine hcl, Diphenhydramine hcl, Penicillins, and Latex   Review of Systems Review of Systems  Constitutional: Negative for activity change, chills and fever.  HENT:  Positive for congestion, sinus pressure and sinus pain. Negative for ear pain, facial swelling, sore throat and voice change.   Eyes: Negative for visual disturbance.  Respiratory: Positive for cough. Negative for shortness of breath.   Cardiovascular: Negative for chest pain.  Gastrointestinal: Positive for diarrhea, nausea and vomiting. Negative for abdominal pain.  Genitourinary: Negative for decreased urine volume, dysuria, enuresis, flank pain, frequency, hematuria and pelvic pain.  Musculoskeletal: Negative for back pain, neck pain and neck stiffness.  Skin: Negative for rash.  Allergic/Immunologic: Negative for immunocompromised state.  Neurological: Positive for headaches. Negative for dizziness, seizures, weakness and numbness.  Psychiatric/Behavioral: Negative for confusion.     Physical Exam Updated Vital Signs BP 119/83   Pulse 77   Temp 98.2 F (36.8 C) (Oral)   Resp 18   SpO2 97%   Physical Exam Vitals signs and nursing note reviewed.  Constitutional:      General: She is not in acute distress.    Appearance: She is obese. She is not ill-appearing, toxic-appearing or diaphoretic.  HENT:     Head: Normocephalic.     Right Ear: External ear normal.     Left Ear: External ear normal.     Nose: Congestion present.     Comments: No focal tenderness over the bilateral maxillary or frontal sinuses.    Mouth/Throat:      Mouth: Mucous membranes are moist.  Eyes:     General: No scleral icterus.    Conjunctiva/sclera: Conjunctivae normal.  Neck:     Musculoskeletal: Normal range of motion and neck supple.  Cardiovascular:     Rate and Rhythm: Normal rate and regular rhythm.     Pulses: Normal pulses.     Heart sounds: Normal heart sounds. No murmur. No friction rub. No gallop.   Pulmonary:     Effort: Pulmonary effort is normal. No respiratory distress.     Breath sounds: Normal breath sounds. No stridor. No rhonchi or rales.     Comments: Lungs are clear to auscultation bilaterally. Chest:     Chest wall: No tenderness.  Abdominal:     General: There is no distension.     Palpations: Abdomen is soft. There is no mass.     Tenderness: There is no abdominal tenderness. There is no right CVA tenderness, left CVA tenderness, guarding or rebound.     Hernia: No hernia is present.  Skin:    General: Skin is warm.     Capillary Refill: Capillary refill takes less than 2 seconds.     Findings: No rash.  Neurological:     Mental Status: She is alert.  Psychiatric:        Behavior: Behavior normal.      ED Treatments / Results  Labs (all labs ordered are listed, but only abnormal results are displayed) Labs Reviewed  COMPREHENSIVE METABOLIC PANEL - Abnormal; Notable for the following components:      Result Value   Glucose, Bld 104 (*)    BUN <5 (*)    Calcium 8.6 (*)    Albumin 3.0 (*)    All other components within normal limits  LIPASE, BLOOD  CBC  I-STAT BETA HCG BLOOD, ED ([facility], [facility], [facility] ONLY)    EKG None  Radiology Dg Chest Port 1 View  Result Date: 04/08/2019 CLINICAL DATA:  33 year old female with cough.  Positive COVID-19. EXAM: PORTABLE CHEST 1 VIEW COMPARISON:  Chest radiograph dated 08/20/2014 FINDINGS: No focal consolidation, pleural effusion, or pneumothorax. Borderline  cardiomegaly. No acute osseous pathology. IMPRESSION: No active disease. Electronically Signed   By: Anner Crete M.D.   On: 04/08/2019 23:14    Procedures Procedures (including critical care time)  Medications Ordered in ED Medications  dexamethasone (DECADRON) injection 10 mg (10 mg Intramuscular Given 04/09/19 0015)     Initial Impression / Assessment and Plan / ED Course  I have reviewed the triage vital signs and the nursing notes.  Pertinent labs & imaging results that were available during my care of the patient were reviewed by me and considered in my medical decision making (see chart for details).        33 year old female history of Sturge-Weber syndrome, seizures, PTSD, GERD, bipolar 2 disorder, recurrent sinus infections, and anxiety who was diagnosed with COVID-19 on October 16 after having 5 days of cough, headache, sinus pain and pressure, and congestion.  Her primary concern today is that she has developed vomiting and diarrhea over the last 24 hours.  She has been tolerating fluids by mouth since her last episode of vomiting.  Nausea has currently resolved.  Clinically, she does not appear dehydrated.  She has no electrolyte derangements.  She is afebrile with normal vital signs in the ER.  At rest, pulse ox is 99% with good waveform on the monitor.  Chest x-ray is unremarkable.  Spoke with the patient's mother via phone while at bedside and answered all questions.  Will ambulate the patient in room on pulse ox and give Decadron.  She declines Zofran for nausea at this time.  Patient ambulated in the room without difficulty and SaO2 remained above 98%.  We will discharge the patient home with supportive care.  She has an albuterol inhaler at home to use as needed.  ER return precautions given.  She is hemodynamically stable and in no acute distress.  Safe for discharge to home at this time.  Final Clinical Impressions(s) / ED Diagnoses   Final diagnoses:  COVID-19  Nausea vomiting and diarrhea    ED Discharge Orders         Ordered    ondansetron (ZOFRAN ODT) 4 MG  disintegrating tablet  Every 8 hours PRN     04/09/19 0042    promethazine-dextromethorphan (PROMETHAZINE-DM) 6.25-15 MG/5ML syrup  4 times daily PRN     04/09/19 0042           Joline Maxcy A, PA-C 04/09/19 0117    Fatima Blank, MD 04/09/19 0234    Joanne Gavel, PA-C 04/09/19 0420    Fatima Blank, MD 04/09/19 [phone]

## 2019-04-09 NOTE — Discharge Instructions (Addendum)
Thank you for allowing me to care for you today in the Emergency Department.   Number 1 tablet of Zofran dissolve in your tongue every 6 hours as needed for nausea or vomiting.  You can take 5 MLS of Promethazine DM as needed for cough and nasal congestion.  Number given a shot of Decadron, a steroid injection, in the ER today.  Take 650 mg of Tylenol or 600 mg of ibuprofen with food every 6 hours for pain.  You can alternate between these 2 medications every 3 hours if your pain returns.  For instance, you can take Tylenol at noon, followed by a dose of ibuprofen at 3, followed by second dose of Tylenol and 6.  Recommend is available over-the-counter if you continue to have diarrhea.  History of COVID-19, you should follow CDC guidelines and try to quarantine yourself for at least 10 days after your symptoms began.  Restrictions with warm water and soap frequently.  Wear a mask if you are unable to avoid interaction with others to avoid spreading infection.  Additional CDC guidelines are attached with your discharge paperwork.  Return to the emergency department if you develop respiratory distress, if you stop making urine, persistent vomiting despite taking Zofran, high fevers despite using Tylenol and ibuprofen as described above, or other new, concerning symptoms.

## 2019-04-09 NOTE — ED Notes (Signed)
Pt ambulated in room without difficulty, oxygen remained above 98% on RA

## 2019-04-10 LAB — NOVEL CORONAVIRUS, NAA (HOSP ORDER, SEND-OUT TO REF LAB; TAT 18-24 HRS): SARS-CoV-2, NAA: DETECTED — AB

## 2019-04-17 ENCOUNTER — Other Ambulatory Visit: Payer: Self-pay

## 2019-04-17 DIAGNOSIS — Z20822 Contact with and (suspected) exposure to covid-19: Secondary | ICD-10-CM

## 2019-04-19 LAB — NOVEL CORONAVIRUS, NAA: SARS-CoV-2, NAA: NOT DETECTED

## 2019-04-25 ENCOUNTER — Other Ambulatory Visit: Payer: Self-pay

## 2019-04-25 DIAGNOSIS — Z20822 Contact with and (suspected) exposure to covid-19: Secondary | ICD-10-CM

## 2019-04-25 DIAGNOSIS — Z20828 Contact with and (suspected) exposure to other viral communicable diseases: Secondary | ICD-10-CM | POA: Diagnosis not present

## 2019-04-26 LAB — NOVEL CORONAVIRUS, NAA: SARS-CoV-2, NAA: NOT DETECTED

## 2019-05-03 DIAGNOSIS — L2089 Other atopic dermatitis: Secondary | ICD-10-CM | POA: Diagnosis not present

## 2019-05-03 DIAGNOSIS — L906 Striae atrophicae: Secondary | ICD-10-CM | POA: Diagnosis not present

## 2019-05-03 DIAGNOSIS — L83 Acanthosis nigricans: Secondary | ICD-10-CM | POA: Diagnosis not present

## 2019-05-04 ENCOUNTER — Other Ambulatory Visit: Payer: Self-pay

## 2019-05-04 DIAGNOSIS — U071 COVID-19: Secondary | ICD-10-CM

## 2019-05-07 ENCOUNTER — Other Ambulatory Visit: Payer: Self-pay

## 2019-05-07 ENCOUNTER — Other Ambulatory Visit (INDEPENDENT_AMBULATORY_CARE_PROVIDER_SITE_OTHER): Payer: Medicare Other

## 2019-05-07 DIAGNOSIS — U071 COVID-19: Secondary | ICD-10-CM

## 2019-05-09 LAB — SARS-COV-2 ANTIBODY, IGM: SARS-CoV-2 Antibody, IgM: NEGATIVE

## 2019-05-22 ENCOUNTER — Other Ambulatory Visit: Payer: Self-pay

## 2019-05-22 MED ORDER — LEVOCETIRIZINE DIHYDROCHLORIDE 5 MG PO TABS: ORAL_TABLET | ORAL | 0 refills | Status: DC

## 2019-05-29 NOTE — Progress Notes (Signed)
Received fax from [facility] on [address] requesting refill of Xyzal. Chart review shows refill sent 05/22/19 to [facility] on [address] in [place]. Faxed response asking [facility] to use the refill from 05/22/19 from [facility] location.

## 2019-06-11 ENCOUNTER — Ambulatory Visit: Payer: Medicare Other | Attending: Internal Medicine

## 2019-06-11 DIAGNOSIS — Z20822 Contact with and (suspected) exposure to covid-19: Secondary | ICD-10-CM

## 2019-06-12 ENCOUNTER — Other Ambulatory Visit: Payer: Medicare Other

## 2019-06-13 ENCOUNTER — Telehealth: Payer: Self-pay

## 2019-06-13 LAB — NOVEL CORONAVIRUS, NAA: SARS-CoV-2, NAA: NOT DETECTED

## 2019-06-13 NOTE — Telephone Encounter (Signed)
Copied from [place] [phone]. Topic: General - Other >> Jun 11, 2019  1:43 PM [name] wrote: Reason for CRM:  pt called wanting to know if she can come in today and get the lab done for the Covid antibodies test done.  She said there was an order but she does not think she ever got the test done. CB#  [phone]

## 2019-06-13 NOTE — Telephone Encounter (Signed)
Responded via Mychart.

## 2019-07-26 ENCOUNTER — Other Ambulatory Visit: Payer: Self-pay

## 2019-07-27 ENCOUNTER — Ambulatory Visit: Payer: Medicare Other | Admitting: Family Medicine

## 2019-09-30 DIAGNOSIS — Z9049 Acquired absence of other specified parts of digestive tract: Secondary | ICD-10-CM | POA: Diagnosis not present

## 2019-09-30 DIAGNOSIS — R229 Localized swelling, mass and lump, unspecified: Secondary | ICD-10-CM | POA: Diagnosis not present

## 2019-09-30 DIAGNOSIS — M7989 Other specified soft tissue disorders: Secondary | ICD-10-CM | POA: Diagnosis not present

## 2019-09-30 DIAGNOSIS — Z79899 Other long term (current) drug therapy: Secondary | ICD-10-CM | POA: Diagnosis not present

## 2019-09-30 DIAGNOSIS — E669 Obesity, unspecified: Secondary | ICD-10-CM | POA: Diagnosis not present

## 2019-09-30 DIAGNOSIS — F329 Major depressive disorder, single episode, unspecified: Secondary | ICD-10-CM | POA: Diagnosis not present

## 2019-09-30 DIAGNOSIS — F419 Anxiety disorder, unspecified: Secondary | ICD-10-CM | POA: Diagnosis not present

## 2019-09-30 DIAGNOSIS — K219 Gastro-esophageal reflux disease without esophagitis: Secondary | ICD-10-CM | POA: Diagnosis not present

## 2019-10-02 ENCOUNTER — Telehealth (INDEPENDENT_AMBULATORY_CARE_PROVIDER_SITE_OTHER): Payer: Medicare Other | Admitting: Family Medicine

## 2019-10-02 ENCOUNTER — Encounter: Payer: Self-pay | Admitting: Family Medicine

## 2019-10-02 ENCOUNTER — Other Ambulatory Visit: Payer: Self-pay

## 2019-10-02 VITALS — BP 130/90 | HR 84 | Ht 61.0 in | Wt 223.0 lb

## 2019-10-02 DIAGNOSIS — R600 Localized edema: Secondary | ICD-10-CM | POA: Diagnosis not present

## 2019-10-02 MED ORDER — FUROSEMIDE 40 MG PO TABS: 40.0000 mg | ORAL_TABLET | Freq: Every day | ORAL | 3 refills | Status: AC

## 2019-10-02 NOTE — Progress Notes (Signed)
Virtual Visit via Video Note  I connected with Gina Cameron on 10/02/19 at  3:20 PM EDT by a video enabled telemedicine application and verified that I am speaking with the correct person using two identifiers.  Location: Patient: home in boyfriends apt  Provider: office   I discussed the limitations of evaluation and management by telemedicine and the availability of in person appointments. The patient expressed understanding and agreed to proceed.  History of Present Illness: Pt is in [place] with boyfriend c/o low ext edema----she went to er last night and they gave her lasix 20 mg ---she has taken two.  It has helped slightly  Pt denies sob, cp or calf pain Er visit reviewed and labs -- all normal    Observations/Objective: Vitals:   10/02/19 1530  BP: 130/90  Pulse: 84   Pt is in  NAD Assessment and Plan: 1. Lower extremity edema - furosemide (LASIX) 40 MG tablet; Take 1 tablet (40 mg total) by mouth daily.  Dispense: 30 tablet; Refill: 3 F/u 2 weeks or sooner prn  Make sure to eat/ drink foods with k in it daily  Follow Up Instructions:    I discussed the assessment and treatment plan with the patient. The patient was provided an opportunity to ask questions and all were answered. The patient agreed with the plan and demonstrated an understanding of the instructions.   The patient was advised to call back or seek an in-person evaluation if the symptoms worsen or if the condition fails to improve as anticipated.  I provided 25 minutes of non-face-to-face time during this encounter.   Ann Held, DO

## 2019-10-02 NOTE — Patient Instructions (Signed)
DASH Eating Plan DASH stands for "Dietary Approaches to Stop Hypertension." The DASH eating plan is a healthy eating plan that has been shown to reduce high blood pressure (hypertension). It may also reduce your risk for type 2 diabetes, heart disease, and stroke. The DASH eating plan may also help with weight loss. What are tips for following this plan?  General guidelines  Avoid eating more than 2,300 mg (milligrams) of salt (sodium) a day. If you have hypertension, you may need to reduce your sodium intake to 1,500 mg a day.  Limit alcohol intake to no more than 1 drink a day for nonpregnant women and 2 drinks a day for men. One drink equals 12 oz of beer, 5 oz of wine, or 1 oz of hard liquor.  Work with your health care provider to maintain a healthy body weight or to lose weight. Ask what an ideal weight is for you.  Get at least 30 minutes of exercise that causes your heart to beat faster (aerobic exercise) most days of the week. Activities may include walking, swimming, or biking.  Work with your health care provider or diet and nutrition specialist (dietitian) to adjust your eating plan to your individual calorie needs. Reading food labels   Check food labels for the amount of sodium per serving. Choose foods with less than 5 percent of the Daily Value of sodium. Generally, foods with less than 300 mg of sodium per serving fit into this eating plan.  To find whole grains, look for the word "whole" as the first word in the ingredient list. Shopping  Buy products labeled as "low-sodium" or "no salt added."  Buy fresh foods. Avoid canned foods and premade or frozen meals. Cooking  Avoid adding salt when cooking. Use salt-free seasonings or herbs instead of table salt or sea salt. Check with your health care provider or pharmacist before using salt substitutes.  Do not fry foods. Cook foods using healthy methods such as baking, boiling, grilling, and broiling instead.  Cook with  heart-healthy oils, such as olive, canola, soybean, or sunflower oil. Meal planning  Eat a balanced diet that includes: ? 5 or more servings of fruits and vegetables each day. At each meal, try to fill half of your plate with fruits and vegetables. ? Up to 6-8 servings of whole grains each day. ? Less than 6 oz of lean meat, poultry, or fish each day. A 3-oz serving of meat is about the same size as a deck of cards. One egg equals 1 oz. ? 2 servings of low-fat dairy each day. ? A serving of nuts, seeds, or beans 5 times each week. ? Heart-healthy fats. Healthy fats called Omega-3 fatty acids are found in foods such as flaxseeds and coldwater fish, like sardines, salmon, and mackerel.  Limit how much you eat of the following: ? Canned or prepackaged foods. ? Food that is high in trans fat, such as fried foods. ? Food that is high in saturated fat, such as fatty meat. ? Sweets, desserts, sugary drinks, and other foods with added sugar. ? Full-fat dairy products.  Do not salt foods before eating.  Try to eat at least 2 vegetarian meals each week.  Eat more home-cooked food and less restaurant, buffet, and fast food.  When eating at a restaurant, ask that your food be prepared with less salt or no salt, if possible. What foods are recommended? The items listed may not be a complete list. Talk with your dietitian about   what dietary choices are best for you. Grains Whole-grain or whole-wheat bread. Whole-grain or whole-wheat pasta. Brown rice. Oatmeal. Quinoa. Bulgur. Whole-grain and low-sodium cereals. Pita bread. Low-fat, low-sodium crackers. Whole-wheat flour tortillas. Vegetables Fresh or frozen vegetables (raw, steamed, roasted, or grilled). Low-sodium or reduced-sodium tomato and vegetable juice. Low-sodium or reduced-sodium tomato sauce and tomato paste. Low-sodium or reduced-sodium canned vegetables. Fruits All fresh, dried, or frozen fruit. Canned fruit in natural juice (without  added sugar). Meat and other protein foods Skinless chicken or turkey. Ground chicken or turkey. Pork with fat trimmed off. Fish and seafood. Egg whites. Dried beans, peas, or lentils. Unsalted nuts, nut butters, and seeds. Unsalted canned beans. Lean cuts of beef with fat trimmed off. Low-sodium, lean deli meat. Dairy Low-fat (1%) or fat-free (skim) milk. Fat-free, low-fat, or reduced-fat cheeses. Nonfat, low-sodium ricotta or cottage cheese. Low-fat or nonfat yogurt. Low-fat, low-sodium cheese. Fats and oils Soft margarine without trans fats. Vegetable oil. Low-fat, reduced-fat, or light mayonnaise and salad dressings (reduced-sodium). Canola, safflower, olive, soybean, and sunflower oils. Avocado. Seasoning and other foods Herbs. Spices. Seasoning mixes without salt. Unsalted popcorn and pretzels. Fat-free sweets. What foods are not recommended? The items listed may not be a complete list. Talk with your dietitian about what dietary choices are best for you. Grains Baked goods made with fat, such as croissants, muffins, or some breads. Dry pasta or rice meal packs. Vegetables Creamed or fried vegetables. Vegetables in a cheese sauce. Regular canned vegetables (not low-sodium or reduced-sodium). Regular canned tomato sauce and paste (not low-sodium or reduced-sodium). Regular tomato and vegetable juice (not low-sodium or reduced-sodium). Pickles. Olives. Fruits Canned fruit in a light or heavy syrup. Fried fruit. Fruit in cream or butter sauce. Meat and other protein foods Fatty cuts of meat. Ribs. Fried meat. Bacon. Sausage. Bologna and other processed lunch meats. Salami. Fatback. Hotdogs. Bratwurst. Salted nuts and seeds. Canned beans with added salt. Canned or smoked fish. Whole eggs or egg yolks. Chicken or turkey with skin. Dairy Whole or 2% milk, cream, and half-and-half. Whole or full-fat cream cheese. Whole-fat or sweetened yogurt. Full-fat cheese. Nondairy creamers. Whipped toppings.  Processed cheese and cheese spreads. Fats and oils Butter. Stick margarine. Lard. Shortening. Ghee. Bacon fat. Tropical oils, such as coconut, palm kernel, or palm oil. Seasoning and other foods Salted popcorn and pretzels. Onion salt, garlic salt, seasoned salt, table salt, and sea salt. Worcestershire sauce. Tartar sauce. Barbecue sauce. Teriyaki sauce. Soy sauce, including reduced-sodium. Steak sauce. Canned and packaged gravies. Fish sauce. Oyster sauce. Cocktail sauce. Horseradish that you find on the shelf. Ketchup. Mustard. Meat flavorings and tenderizers. Bouillon cubes. Hot sauce and Tabasco sauce. Premade or packaged marinades. Premade or packaged taco seasonings. Relishes. Regular salad dressings. Where to find more information:  National Heart, Lung, and Blood Institute: www.nhlbi.nih.gov  American Heart Association: www.heart.org Summary  The DASH eating plan is a healthy eating plan that has been shown to reduce high blood pressure (hypertension). It may also reduce your risk for type 2 diabetes, heart disease, and stroke.  With the DASH eating plan, you should limit salt (sodium) intake to 2,300 mg a day. If you have hypertension, you may need to reduce your sodium intake to 1,500 mg a day.  When on the DASH eating plan, aim to eat more fresh fruits and vegetables, whole grains, lean proteins, low-fat dairy, and heart-healthy fats.  Work with your health care provider or diet and nutrition specialist (dietitian) to adjust your eating plan to your   individual calorie needs. This information is not intended to replace advice given to you by your health care provider. Make sure you discuss any questions you have with your health care provider. Document Revised: 05/20/2017 Document Reviewed: 05/31/2016 Elsevier Patient Education  2020 Elsevier Inc.  

## 2019-10-15 ENCOUNTER — Other Ambulatory Visit: Payer: Self-pay

## 2019-10-16 ENCOUNTER — Ambulatory Visit (INDEPENDENT_AMBULATORY_CARE_PROVIDER_SITE_OTHER): Payer: Medicare Other | Admitting: Family Medicine

## 2019-10-16 VITALS — BP 120/90 | HR 84 | Temp 98.0°F | Resp 18 | Ht 61.0 in | Wt 237.0 lb

## 2019-10-16 DIAGNOSIS — F319 Bipolar disorder, unspecified: Secondary | ICD-10-CM

## 2019-10-16 DIAGNOSIS — R569 Unspecified convulsions: Secondary | ICD-10-CM

## 2019-10-16 DIAGNOSIS — T7840XA Allergy, unspecified, initial encounter: Secondary | ICD-10-CM | POA: Diagnosis not present

## 2019-10-16 DIAGNOSIS — R600 Localized edema: Secondary | ICD-10-CM

## 2019-10-16 DIAGNOSIS — Z6841 Body Mass Index (BMI) 40.0 and over, adult: Secondary | ICD-10-CM

## 2019-10-16 DIAGNOSIS — Q858 Other phakomatoses, not elsewhere classified: Secondary | ICD-10-CM | POA: Diagnosis not present

## 2019-10-16 DIAGNOSIS — Q8589 Other phakomatoses, not elsewhere classified: Secondary | ICD-10-CM

## 2019-10-16 DIAGNOSIS — F419 Anxiety disorder, unspecified: Secondary | ICD-10-CM

## 2019-10-16 DIAGNOSIS — F418 Other specified anxiety disorders: Secondary | ICD-10-CM

## 2019-10-16 MED ORDER — MONTELUKAST SODIUM 10 MG PO TABS: 10.0000 mg | ORAL_TABLET | Freq: Every day | ORAL | 3 refills | Status: DC

## 2019-10-16 MED ORDER — FLUOXETINE HCL 20 MG PO CAPS: 20.0000 mg | ORAL_CAPSULE | Freq: Every day | ORAL | 2 refills | Status: DC

## 2019-10-16 NOTE — Patient Instructions (Addendum)
Edema  Edema is an abnormal buildup of fluids in the body tissues and under the skin. Swelling of the legs, feet, and ankles is a common symptom that becomes more likely as you get older. Swelling is also common in looser tissues, like around the eyes. When the affected area is squeezed, the fluid may move out of that spot and leave a dent for a few moments. This dent is called pitting edema. There are many possible causes of edema. Eating too much salt (sodium) and being on your feet or sitting for a long time can cause edema in your legs, feet, and ankles. Hot weather may make edema worse. Common causes of edema include:  Heart failure.  Liver or kidney disease.  Weak leg blood vessels.  Cancer.  An injury.  Pregnancy.  Medicines.  Being obese.  Low protein levels in the blood. Edema is usually painless. Your skin may look swollen or shiny. Follow these instructions at home:  Keep the affected body part raised (elevated) above the level of your heart when you are sitting or lying down.  Do not sit still or stand for long periods of time.  Do not wear tight clothing. Do not wear garters on your upper legs.  Exercise your legs to get your circulation going. This helps to move the fluid back into your blood vessels, and it may help the swelling go down.  Wear elastic bandages or support stockings to reduce swelling as told by your health care provider.  Eat a low-salt (low-sodium) diet to reduce fluid as told by your health care provider.  Depending on the cause of your swelling, you may need to limit how much fluid you drink (fluid restriction).  Take over-the-counter and prescription medicines only as told by your health care provider. Contact a health care provider if:  Your edema does not get better with treatment.  You have heart, liver, or kidney disease and have symptoms of edema.  You have sudden and unexplained weight gain. Get help right away if:  You develop  shortness of breath or chest pain.  You cannot breathe when you lie down.  You develop pain, redness, or warmth in the swollen areas.  You have heart, liver, or kidney disease and suddenly get edema.  You have a fever and your symptoms suddenly get worse. Summary  Edema is an abnormal buildup of fluids in the body tissues and under the skin.  Eating too much salt (sodium) and being on your feet or sitting for a long time can cause edema in your legs, feet, and ankles.  Keep the affected body part raised (elevated) above the level of your heart when you are sitting or lying down. This information is not intended to replace advice given to you by your health care provider. Make sure you discuss any questions you have with your health care provider. Document Revised: 10/25/2018 Document Reviewed: 07/10/2016 Elsevier Patient Education  [employer] DASH stands for "Dietary Approaches to Stop Hypertension." The DASH eating plan is a healthy eating plan that has been shown to reduce high blood pressure (hypertension). It may also reduce your risk for type 2 diabetes, heart disease, and stroke. The DASH eating plan may also help with weight loss. What are tips for following this plan?  General guidelines  Avoid eating more than 2,300 mg (milligrams) of salt (sodium) a day. If you have hypertension, you may need to reduce your sodium intake to 1,500 mg  a day.  Limit alcohol intake to no more than 1 drink a day for nonpregnant women and 2 drinks a day for men. One drink equals 12 oz of beer, 5 oz of wine, or 1 oz of hard liquor.  Work with your health care provider to maintain a healthy body weight or to lose weight. Ask what an ideal weight is for you.  Get at least 30 minutes of exercise that causes your heart to beat faster (aerobic exercise) most days of the week. Activities may include walking, swimming, or biking.  Work with your health care provider or diet  and nutrition specialist (dietitian) to adjust your eating plan to your individual calorie needs. Reading food labels   Check food labels for the amount of sodium per serving. Choose foods with less than 5 percent of the Daily Value of sodium. Generally, foods with less than 300 mg of sodium per serving fit into this eating plan.  To find whole grains, look for the word "whole" as the first word in the ingredient list. Shopping  Buy products labeled as "low-sodium" or "no salt added."  Buy fresh foods. Avoid canned foods and premade or frozen meals. Cooking  Avoid adding salt when cooking. Use salt-free seasonings or herbs instead of table salt or sea salt. Check with your health care provider or pharmacist before using salt substitutes.  Do not fry foods. Cook foods using healthy methods such as baking, boiling, grilling, and broiling instead.  Cook with heart-healthy oils, such as olive, canola, soybean, or sunflower oil. Meal planning  Eat a balanced diet that includes: ? 5 or more servings of fruits and vegetables each day. At each meal, try to fill half of your plate with fruits and vegetables. ? Up to 6-8 servings of whole grains each day. ? Less than 6 oz of lean meat, poultry, or fish each day. A 3-oz serving of meat is about the same size as a deck of cards. One egg equals 1 oz. ? 2 servings of low-fat dairy each day. ? A serving of nuts, seeds, or beans 5 times each week. ? Heart-healthy fats. Healthy fats called Omega-3 fatty acids are found in foods such as flaxseeds and coldwater fish, like sardines, salmon, and mackerel.  Limit how much you eat of the following: ? Canned or prepackaged foods. ? Food that is high in trans fat, such as fried foods. ? Food that is high in saturated fat, such as fatty meat. ? Sweets, desserts, sugary drinks, and other foods with added sugar. ? Full-fat dairy products.  Do not salt foods before eating.  Try to eat at least 2 vegetarian  meals each week.  Eat more home-cooked food and less restaurant, buffet, and fast food.  When eating at a restaurant, ask that your food be prepared with less salt or no salt, if possible. What foods are recommended? The items listed may not be a complete list. Talk with your dietitian about what dietary choices are best for you. Grains Whole-grain or whole-wheat bread. Whole-grain or whole-wheat pasta. Brown rice. [place]. Bulgur. Whole-grain and low-sodium cereals. Pita bread. Low-fat, low-sodium crackers. Whole-wheat flour tortillas. Vegetables Fresh or frozen vegetables (raw, steamed, roasted, or grilled). Low-sodium or reduced-sodium tomato and vegetable juice. Low-sodium or reduced-sodium tomato sauce and tomato paste. Low-sodium or reduced-sodium canned vegetables. Fruits All fresh, dried, or frozen fruit. Canned fruit in natural juice (without added sugar). Meat and other protein foods Skinless chicken or [place]. Ground chicken or [place].  Pork with fat trimmed off. Fish and seafood. Egg whites. Dried beans, peas, or lentils. Unsalted nuts, nut butters, and seeds. Unsalted canned beans. Lean cuts of beef with fat trimmed off. Low-sodium, lean deli meat. Dairy Low-fat (1%) or fat-free (skim) milk. Fat-free, low-fat, or reduced-fat cheeses. Nonfat, low-sodium ricotta or cottage cheese. Low-fat or nonfat yogurt. Low-fat, low-sodium cheese. Fats and oils Soft margarine without trans fats. Vegetable oil. Low-fat, reduced-fat, or light mayonnaise and salad dressings (reduced-sodium). Canola, safflower, olive, soybean, and sunflower oils. Avocado. Seasoning and other foods Herbs. Spices. Seasoning mixes without salt. Unsalted popcorn and pretzels. Fat-free sweets. What foods are not recommended? The items listed may not be a complete list. Talk with your dietitian about what dietary choices are best for you. Grains Baked goods made with fat, such as croissants, muffins, or some  breads. Dry pasta or rice meal packs. Vegetables Creamed or fried vegetables. Vegetables in a cheese sauce. Regular canned vegetables (not low-sodium or reduced-sodium). Regular canned tomato sauce and paste (not low-sodium or reduced-sodium). Regular tomato and vegetable juice (not low-sodium or reduced-sodium). Angie Fava. Olives. Fruits Canned fruit in a light or heavy syrup. Fried fruit. Fruit in cream or butter sauce. Meat and other protein foods Fatty cuts of meat. Ribs. Fried meat. Berniece Salines. Sausage. Bologna and other processed lunch meats. Salami. Fatback. Hotdogs. Bratwurst. Salted nuts and seeds. Canned beans with added salt. Canned or smoked fish. Whole eggs or egg yolks. Chicken or [place] with skin. Dairy Whole or 2% milk, cream, and half-and-half. Whole or full-fat cream cheese. Whole-fat or sweetened yogurt. Full-fat cheese. Nondairy creamers. Whipped toppings. Processed cheese and cheese spreads. Fats and oils Butter. Stick margarine. Lard. Shortening. Ghee. Bacon fat. Tropical oils, such as coconut, palm kernel, or palm oil. Seasoning and other foods Salted popcorn and pretzels. Onion salt, garlic salt, seasoned salt, table salt, and sea salt. Worcestershire sauce. Tartar sauce. Barbecue sauce. Teriyaki sauce. Soy sauce, including reduced-sodium. Steak sauce. Canned and packaged gravies. Fish sauce. Oyster sauce. Cocktail sauce. Horseradish that you find on the shelf. Ketchup. Mustard. Meat flavorings and tenderizers. Bouillon cubes. Hot sauce and Tabasco sauce. Premade or packaged marinades. Premade or packaged taco seasonings. Relishes. Regular salad dressings. Where to find more information:  National Heart, Lung, and [place]: https://wilson-eaton.com/  American Heart Association: www.heart.org Summary  The DASH eating plan is a healthy eating plan that has been shown to reduce high blood pressure (hypertension). It may also reduce your risk for type 2 diabetes, heart disease, and  stroke.  With the DASH eating plan, you should limit salt (sodium) intake to 2,300 mg a day. If you have hypertension, you may need to reduce your sodium intake to 1,500 mg a day.  When on the DASH eating plan, aim to eat more fresh fruits and vegetables, whole grains, lean proteins, low-fat dairy, and heart-healthy fats.  Work with your health care provider or diet and nutrition specialist (dietitian) to adjust your eating plan to your individual calorie needs. This information is not intended to replace advice given to you by your health care provider. Make sure you discuss any questions you have with your health care provider. Document Revised: 05/20/2017 Document Reviewed: 05/31/2016 Elsevier Patient Education  2020 [employer].

## 2019-10-16 NOTE — Progress Notes (Signed)
Patient ID: Gina Cameron, female    DOB: Mar 19, 1986  Age: 34 y.o. MRN: [id number]    Subjective:  Subjective  HPI Gina Cameron presents for f/u edema and c/o allergies the last few weeks    No fever She has had cough and voice comes and goes      Review of Systems  Constitutional: Negative for appetite change, diaphoresis, fatigue and unexpected weight change.  Eyes: Negative for pain, redness and visual disturbance.  Respiratory: Negative for cough, chest tightness, shortness of breath and wheezing.   Cardiovascular: Positive for leg swelling. Negative for chest pain and palpitations.  Endocrine: Negative for cold intolerance, heat intolerance, polydipsia, polyphagia and polyuria.  Genitourinary: Negative for difficulty urinating, dysuria and frequency.  Neurological: Negative for dizziness, light-headedness, numbness and headaches.    History Past Medical History:  Diagnosis Date  . Anxiety   . Bipolar 2 disorder ([place])   . Constipation   . Depression   . GERD (gastroesophageal reflux disease)   . Hives   . Joint pain   . Leg edema   . Migraine   . PTSD (post-traumatic stress disorder)   . Reflux   . Seizures ([place])   . Sleep apnea   . 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   Her family history includes Depression in her father; Diabetes in her maternal grandmother; Heart disease in her mother; Hyperlipidemia in her father and maternal grandmother; Hypertension in her father, maternal grandmother, and mother.She reports that she quit smoking about 7 years ago. Her smoking use included cigarettes. She has a 0.20 pack-year smoking history. She has never used smokeless tobacco. She reports current alcohol use. She reports current drug use. Drug: Marijuana.  Current Outpatient Medications on File Prior to Visit  Medication Sig Dispense Refill  . amitriptyline  (ELAVIL) 10 MG tablet TK 1 T PO QHS    . Dextromethorphan HBr (ROBITUSSIN COUGHGELS PO) Take by mouth.    . furosemide (LASIX) 40 MG tablet Take 1 tablet (40 mg total) by mouth daily. 30 tablet 3  . ibuprofen (ADVIL) 800 MG tablet Take 1 tablet (800 mg total) by mouth every 6 (six) hours as needed. 30 tablet 2  . levocetirizine (XYZAL) 5 MG tablet TAKE 2 TABLETS(10 MG) BY MOUTH EVERY MORNING 180 tablet 0  . meloxicam (MOBIC) 15 MG tablet Take 1 tablet (15 mg total) by mouth daily. 30 tablet 2  . omeprazole (PRILOSEC) 20 MG capsule TAKE 1 CAPSULE(20 MG) BY MOUTH DAILY 90 capsule 1  . ondansetron (ZOFRAN ODT) 4 MG disintegrating tablet Take 1 tablet (4 mg total) by mouth every 8 (eight) hours as needed. 20 tablet 0  . topiramate (TOPAMAX) 100 MG tablet Take 1 tablet (100 mg total) by mouth 2 (two) times daily. 28 tablet 0  . Vitamin D, Ergocalciferol, (DRISDOL) 1.25 MG (50000 UT) CAPS capsule Take 1 capsule (50,000 Units total) by mouth every 7 (seven) days. 4 capsule 0  . promethazine-dextromethorphan (PROMETHAZINE-DM) 6.25-15 MG/5ML syrup Take 5 mLs by mouth 4 (four) times daily as needed for cough. (Patient not taking: Reported on 10/16/2019) 118 mL 0   No current facility-administered medications on file prior to visit.     Objective:  Objective  Physical Exam Vitals and nursing note reviewed.  Constitutional:      Appearance: She is well-developed.  HENT:     Right Ear: External ear  normal.     Left Ear: External ear normal.  Eyes:     General:        Right eye: No discharge.        Left eye: No discharge.     Conjunctiva/sclera: Conjunctivae normal.  Cardiovascular:     Rate and Rhythm: Normal rate and regular rhythm.     Heart sounds: Normal heart sounds. No murmur.  Pulmonary:     Effort: Pulmonary effort is normal. No respiratory distress.     Breath sounds: Normal breath sounds. No wheezing or rales.  Chest:     Chest wall: No tenderness.  Musculoskeletal:     Right lower  leg: No edema.     Left lower leg: No edema.  Lymphadenopathy:     Cervical: Cervical adenopathy present.  Neurological:     Mental Status: She is alert and oriented to person, place, and time.    BP 120/90 (BP Location: Left Arm, Patient Position: Sitting, Cuff Size: Large)   Pulse 84   Temp 98 F (36.7 C) (Temporal)   Resp 18   Ht 5\' 1"  (1.549 m)   Wt 237 lb (107.5 kg)   SpO2 96%   BMI 44.78 kg/m  Wt Readings from Last 3 Encounters:  10/16/19 237 lb (107.5 kg)  10/02/19 223 lb (101.2 kg)  01/09/19 226 lb 6.4 oz (102.7 kg)     Lab Results  Component Value Date   WBC 4.5 04/08/2019   HGB 14.2 04/08/2019   HCT 44.3 04/08/2019   PLT 359 04/08/2019   GLUCOSE 104 (H) 04/08/2019   CHOL 150 01/09/2019   TRIG 126.0 01/09/2019   HDL 56.90 01/09/2019   LDLCALC 68 01/09/2019   ALT 39 04/08/2019   AST 29 04/08/2019   NA 140 04/08/2019   K 3.9 04/08/2019   CL 106 04/08/2019   CREATININE 0.66 04/08/2019   BUN <5 (L) 04/08/2019   CO2 26 04/08/2019   TSH 2.75 01/09/2019   HGBA1C 5.5 05/10/2018    DG Chest Port 1 View  Result Date: 04/08/2019 CLINICAL DATA:  34 year old female with cough.  Positive COVID-19. EXAM: PORTABLE CHEST 1 VIEW COMPARISON:  Chest radiograph dated 08/20/2014 FINDINGS: No focal consolidation, pleural effusion, or pneumothorax. Borderline cardiomegaly. No acute osseous pathology. IMPRESSION: No active disease. Electronically Signed   By: Anner Crete M.D.   On: 04/08/2019 23:14     Assessment & Plan:  Plan  I have changed Gina Cameron's FLUoxetine. I am also having her start on montelukast. Additionally, I am having her maintain her topiramate, meloxicam, Vitamin D (Ergocalciferol), omeprazole, ibuprofen, Dextromethorphan HBr (ROBITUSSIN COUGHGELS PO), amitriptyline, ondansetron, promethazine-dextromethorphan, levocetirizine, and furosemide.  Meds ordered this encounter  Medications  . FLUoxetine (PROZAC) 20 MG capsule    Sig: Take 1 capsule  (20 mg total) by mouth daily.    Dispense:  30 capsule    Refill:  2  . montelukast (SINGULAIR) 10 MG tablet    Sig: Take 1 tablet (10 mg total) by mouth at bedtime.    Dispense:  30 tablet    Refill:  3    Problem List Items Addressed This Visit      Unprioritized   Allergies    Pt unable to tolerated nasal steroids Add singulair  Cont' nasal saline  Consider allergist       Relevant Medications   montelukast (SINGULAIR) 10 MG tablet   Bipolar depression (HCC)   Class 3 severe obesity with serious comorbidity  and body mass index (BMI) of 40.0 to 44.9 in adult ([place])    Pt working on diet and exercise      Depression with anxiety    Stable con't prozac       Relevant Medications   FLUoxetine (PROZAC) 20 MG capsule   Seizures (HCC)    Stable  No recent seizures       Sturge-Weber syndrome (HCC)    Stable  No changes       Other Visit Diagnoses    Anxiety    -  Primary   Relevant Medications   FLUoxetine (PROZAC) 20 MG capsule   Lower extremity edema       Relevant Orders   Basic metabolic panel      Follow-up: Return in about 3 months (around 01/15/2020), or if symptoms worsen or fail to improve.  Ann Held, DO

## 2019-10-17 ENCOUNTER — Encounter: Payer: Self-pay | Admitting: Family Medicine

## 2019-10-17 DIAGNOSIS — Z6841 Body Mass Index (BMI) 40.0 and over, adult: Secondary | ICD-10-CM | POA: Insufficient documentation

## 2019-10-17 DIAGNOSIS — T7840XA Allergy, unspecified, initial encounter: Secondary | ICD-10-CM | POA: Insufficient documentation

## 2019-10-17 DIAGNOSIS — F319 Bipolar disorder, unspecified: Secondary | ICD-10-CM | POA: Insufficient documentation

## 2019-10-17 LAB — BASIC METABOLIC PANEL
BUN: 7 mg/dL (ref 6–23)
CO2: 27 mEq/L (ref 19–32)
Calcium: 8.9 mg/dL (ref 8.4–10.5)
Chloride: 103 mEq/L (ref 96–112)
Creatinine, Ser: 0.72 mg/dL (ref 0.40–1.20)
GFR: 113.89 mL/min (ref >60.00)
Glucose, Bld: 82 mg/dL (ref 70–99)
Potassium: 4.1 mEq/L (ref 3.5–5.1)
Sodium: 137 mEq/L (ref 135–145)

## 2019-10-17 NOTE — Assessment & Plan Note (Signed)
Stable  No recent seizures

## 2019-10-17 NOTE — Assessment & Plan Note (Signed)
Pt working on diet and exercise

## 2019-10-17 NOTE — Assessment & Plan Note (Signed)
Stable--- con't prozac   

## 2019-10-17 NOTE — Assessment & Plan Note (Signed)
Stable No changes 

## 2019-10-17 NOTE — Assessment & Plan Note (Signed)
Pt unable to tolerated nasal steroids Add singulair  Cont' nasal saline  Consider allergist

## 2020-01-15 ENCOUNTER — Other Ambulatory Visit: Payer: Self-pay

## 2020-01-15 ENCOUNTER — Encounter: Payer: Self-pay | Admitting: Family Medicine

## 2020-01-15 ENCOUNTER — Ambulatory Visit (INDEPENDENT_AMBULATORY_CARE_PROVIDER_SITE_OTHER): Payer: Medicare Other | Admitting: Family Medicine

## 2020-01-15 VITALS — BP 142/86 | HR 82 | Resp 17 | Ht 61.0 in | Wt 256.0 lb

## 2020-01-15 DIAGNOSIS — R140 Abdominal distension (gaseous): Secondary | ICD-10-CM | POA: Diagnosis not present

## 2020-01-15 DIAGNOSIS — G4700 Insomnia, unspecified: Secondary | ICD-10-CM | POA: Diagnosis not present

## 2020-01-15 DIAGNOSIS — F419 Anxiety disorder, unspecified: Secondary | ICD-10-CM | POA: Diagnosis not present

## 2020-01-15 DIAGNOSIS — K219 Gastro-esophageal reflux disease without esophagitis: Secondary | ICD-10-CM | POA: Diagnosis not present

## 2020-01-15 DIAGNOSIS — R102 Pelvic and perineal pain: Secondary | ICD-10-CM

## 2020-01-15 DIAGNOSIS — T7840XA Allergy, unspecified, initial encounter: Secondary | ICD-10-CM

## 2020-01-15 DIAGNOSIS — D509 Iron deficiency anemia, unspecified: Secondary | ICD-10-CM | POA: Diagnosis not present

## 2020-01-15 DIAGNOSIS — J302 Other seasonal allergic rhinitis: Secondary | ICD-10-CM | POA: Diagnosis not present

## 2020-01-15 DIAGNOSIS — R609 Edema, unspecified: Secondary | ICD-10-CM | POA: Diagnosis not present

## 2020-01-15 MED ORDER — LEVOCETIRIZINE DIHYDROCHLORIDE 5 MG PO TABS: ORAL_TABLET | ORAL | 1 refills | Status: DC

## 2020-01-15 MED ORDER — OMEPRAZOLE 20 MG PO CPDR: DELAYED_RELEASE_CAPSULE | ORAL | 1 refills | Status: DC

## 2020-01-15 MED ORDER — FLUOXETINE HCL 20 MG PO CAPS: 20.0000 mg | ORAL_CAPSULE | Freq: Every day | ORAL | 3 refills | Status: AC

## 2020-01-15 MED ORDER — MONTELUKAST SODIUM 10 MG PO TABS: 10.0000 mg | ORAL_TABLET | Freq: Every day | ORAL | 3 refills | Status: AC

## 2020-01-15 MED ORDER — AMITRIPTYLINE HCL 10 MG PO TABS: ORAL_TABLET | ORAL | 1 refills | Status: DC

## 2020-01-15 NOTE — Progress Notes (Signed)
Patient ID: Gina Cameron, female    DOB: 1985/08/31  Age: 34 y.o. MRN: [id number]    Subjective:  Subjective  HPI Gina Cameron presents for f/u anxiety and swelling ----  The diuretic helped but it comes right back per pt. She is unable to wear rings and she wears compression at work The anxiety is stable -- she feels the meds are working   Review of Systems  Constitutional: Negative for appetite change, diaphoresis, fatigue and unexpected weight change.  Eyes: Negative for pain, redness and visual disturbance.  Respiratory: Negative for cough, chest tightness, shortness of breath and wheezing.   Cardiovascular: Positive for leg swelling. Negative for chest pain and palpitations.  Endocrine: Negative for cold intolerance, heat intolerance, polydipsia, polyphagia and polyuria.  Genitourinary: Negative for difficulty urinating, dysuria and frequency.  Neurological: Negative for dizziness, light-headedness, numbness and headaches.    History Past Medical History:  Diagnosis Date  . Anxiety   . Bipolar 2 disorder ([place])   . Constipation   . Depression   . GERD (gastroesophageal reflux disease)   . Hives   . Joint pain   . Leg edema   . Migraine   . PTSD (post-traumatic stress disorder)   . Reflux   . Seizures ([place])   . Sleep apnea   . 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   Her family history includes Depression in her father; Diabetes in her maternal grandmother; Heart disease in her mother; Hyperlipidemia in her father and maternal grandmother; Hypertension in her father, maternal grandmother, and mother.She reports that she quit smoking about 7 years ago. Her smoking use included cigarettes. She has a 0.20 pack-year smoking history. She has never used smokeless tobacco. She reports current alcohol use. She reports current drug use. Drug: Marijuana.  Current  Outpatient Medications on File Prior to Visit  Medication Sig Dispense Refill  . cetirizine (ZYRTEC) 10 MG tablet Take 10 mg by mouth daily.    . furosemide (LASIX) 40 MG tablet Take 1 tablet (40 mg total) by mouth daily. 30 tablet 3  . ibuprofen (ADVIL) 800 MG tablet Take 800 mg by mouth every 8 (eight) hours as needed.     No current facility-administered medications on file prior to visit.     Objective:  Objective  Physical Exam Vitals and nursing note reviewed.  Constitutional:      Appearance: She is well-developed.  HENT:     Head: Normocephalic and atraumatic.  Eyes:     Conjunctiva/sclera: Conjunctivae normal.  Neck:     Thyroid: No thyromegaly.     Vascular: No carotid bruit or JVD.  Cardiovascular:     Rate and Rhythm: Normal rate and regular rhythm.     Heart sounds: Normal heart sounds. No murmur heard.   Pulmonary:     Effort: Pulmonary effort is normal. No respiratory distress.     Breath sounds: Normal breath sounds. No wheezing or rales.  Chest:     Chest wall: No tenderness.  Musculoskeletal:        General: Swelling present.     Cervical back: Normal range of motion and neck supple.     Right lower leg: Edema present.     Left lower leg: Edema present.  Neurological:     Mental Status: She is alert and oriented to person, place, and time.    BP (!) 142/86 (BP  Location: Right Arm, Patient Position: Sitting, Cuff Size: Normal)   Pulse 82   Resp 17   Ht 5\' 1"  (1.549 m)   Wt (!) 256 lb (116.1 kg)   SpO2 95%   BMI 48.37 kg/m  Wt Readings from Last 3 Encounters:  01/15/20 (!) 256 lb (116.1 kg)  10/16/19 237 lb (107.5 kg)  10/02/19 223 lb (101.2 kg)     Lab Results  Component Value Date   WBC 4.5 04/08/2019   HGB 14.2 04/08/2019   HCT 44.3 04/08/2019   PLT 359 04/08/2019   GLUCOSE 82 10/16/2019   CHOL 150 01/09/2019   TRIG 126.0 01/09/2019   HDL 56.90 01/09/2019   LDLCALC 68 01/09/2019   ALT 39 04/08/2019   AST 29 04/08/2019   NA 137  10/16/2019   K 4.1 10/16/2019   CL 103 10/16/2019   CREATININE 0.72 10/16/2019   BUN 7 10/16/2019   CO2 27 10/16/2019   TSH 2.75 01/09/2019   HGBA1C 5.5 05/10/2018    DG Chest Port 1 View  Result Date: 04/08/2019 CLINICAL DATA:  34 year old female with cough.  Positive COVID-19. EXAM: PORTABLE CHEST 1 VIEW COMPARISON:  Chest radiograph dated 08/20/2014 FINDINGS: No focal consolidation, pleural effusion, or pneumothorax. Borderline cardiomegaly. No acute osseous pathology. IMPRESSION: No active disease. Electronically Signed   By: Anner Crete M.D.   On: 04/08/2019 23:14  +   Assessment & Plan:  Plan  I have discontinued Gina Cameron's topiramate, meloxicam, Vitamin D (Ergocalciferol), Dextromethorphan HBr (ROBITUSSIN COUGHGELS PO), ondansetron, and promethazine-dextromethorphan. I am also having her maintain her furosemide, cetirizine, ibuprofen, FLUoxetine, omeprazole, amitriptyline, levocetirizine, and montelukast.  Meds ordered this encounter  Medications  . FLUoxetine (PROZAC) 20 MG capsule    Sig: Take 1 capsule (20 mg total) by mouth daily.    Dispense:  90 capsule    Refill:  3  . omeprazole (PRILOSEC) 20 MG capsule    Sig: TAKE 1 CAPSULE(20 MG) BY MOUTH DAILY    Dispense:  90 capsule    Refill:  1    **Patient requests 90 days supply**  . amitriptyline (ELAVIL) 10 MG tablet    Sig: TK 1 T PO QHS    Dispense:  90 tablet    Refill:  1  . levocetirizine (XYZAL) 5 MG tablet    Sig: TAKE 2 TABLETS(10 MG) BY MOUTH EVERY MORNING    Dispense:  180 tablet    Refill:  1  . montelukast (SINGULAIR) 10 MG tablet    Sig: Take 1 tablet (10 mg total) by mouth at bedtime.    Dispense:  30 tablet    Refill:  3    Problem List Items Addressed This Visit      Unprioritized   Abdominal bloating    Check [place]  Check lab s       Relevant Orders   US Abdomen Complete   US Pelvic Complete With Transvaginal   Allergies   Relevant Medications   montelukast (SINGULAIR) 10  MG tablet   MORBID OBESITY    D/w pt diet and exercise  She was going to healthy weight and wellness but was dropped because she missed an app She still has the info and is trying to follow the diet        Pelvic pain    Pt has f/u gyn Check [place]  She is expecting her period soon       Relevant Orders   US Abdomen Complete  US Pelvic Complete With Transvaginal   Seasonal allergies - Primary    xyzal and flonase Not working as well She used to see allergist --- will refer back       Relevant Medications   levocetirizine (XYZAL) 5 MG tablet   Other Relevant Orders   Ambulatory referral to Allergy   Swelling    Keep elevated  Compression socks      Relevant Orders   CBC with Differential/Platelet   Comprehensive metabolic panel   Thyroid Panel With TSH   Antinuclear Antib (ANA)   Sedimentation rate    Other Visit Diagnoses    Anxiety       Relevant Medications   FLUoxetine (PROZAC) 20 MG capsule   amitriptyline (ELAVIL) 10 MG tablet   Gastroesophageal reflux disease       Relevant Medications   omeprazole (PRILOSEC) 20 MG capsule   Insomnia, unspecified type       Relevant Medications   amitriptyline (ELAVIL) 10 MG tablet   Iron deficiency anemia, unspecified iron deficiency anemia type       Relevant Orders   IBC + Ferritin      Follow-up: Return in about 3 months (around 04/16/2020), or if symptoms worsen or fail to improve.  Ann Held, DO

## 2020-01-15 NOTE — Patient Instructions (Signed)

## 2020-01-15 NOTE — Assessment & Plan Note (Signed)
D/w pt diet and exercise  She was going to healthy weight and wellness but was dropped because she missed an app She still has the info and is trying to follow the diet

## 2020-01-15 NOTE — Assessment & Plan Note (Signed)
Keep elevated  Compression socks

## 2020-01-15 NOTE — Assessment & Plan Note (Signed)
Check [place]  Check lab s

## 2020-01-15 NOTE — Assessment & Plan Note (Signed)
xyzal and flonase Not working as well She used to see allergist --- will refer back

## 2020-01-15 NOTE — Assessment & Plan Note (Signed)
Pt has f/u gyn Check [place]  She is expecting her period soon

## 2020-01-16 LAB — CBC WITH DIFFERENTIAL/PLATELET
Basophils Absolute: 0.2 10*3/uL — ABNORMAL HIGH (ref 0.0–0.1)
Basophils Relative: 2.1 % (ref 0.0–3.0)
Eosinophils Absolute: 0.8 10*3/uL — ABNORMAL HIGH (ref 0.0–0.7)
Eosinophils Relative: 9.8 % — ABNORMAL HIGH (ref 0.0–5.0)
HCT: 40.8 % (ref 36.0–46.0)
Hemoglobin: 13.3 g/dL (ref 12.0–15.0)
Lymphocytes Relative: 50.2 % — ABNORMAL HIGH (ref 12.0–46.0)
Lymphs Abs: 3.9 10*3/uL (ref 0.7–4.0)
MCHC: 32.6 g/dL (ref 30.0–36.0)
MCV: 87.5 fl (ref 78.0–100.0)
Monocytes Absolute: 0.5 10*3/uL (ref 0.1–1.0)
Monocytes Relative: 7 % (ref 3.0–12.0)
Neutro Abs: 2.4 10*3/uL (ref 1.4–7.7)
Neutrophils Relative %: 30.9 % — ABNORMAL LOW (ref 43.0–77.0)
Platelets: 385 10*3/uL (ref 150.0–400.0)
RBC: 4.67 Mil/uL (ref 3.87–5.11)
RDW: 15.7 % — ABNORMAL HIGH (ref 11.5–15.5)
WBC: 7.7 10*3/uL (ref 4.0–10.5)

## 2020-01-16 LAB — COMPREHENSIVE METABOLIC PANEL
ALT: 51 U/L — ABNORMAL HIGH (ref 0–35)
AST: 26 U/L (ref 0–37)
Albumin: 3.5 g/dL (ref 3.5–5.2)
Alkaline Phosphatase: 66 U/L (ref 39–117)
BUN: 9 mg/dL (ref 6–23)
CO2: 31 mEq/L (ref 19–32)
Calcium: 9.2 mg/dL (ref 8.4–10.5)
Chloride: 105 mEq/L (ref 96–112)
Creatinine, Ser: 0.81 mg/dL (ref 0.40–1.20)
GFR: 99.25 mL/min (ref >60.00)
Glucose, Bld: 86 mg/dL (ref 70–99)
Potassium: 4.4 mEq/L (ref 3.5–5.1)
Sodium: 140 mEq/L (ref 135–145)
Total Bilirubin: 0.2 mg/dL (ref 0.2–1.2)
Total Protein: 6.6 g/dL (ref 6.0–8.3)

## 2020-01-16 LAB — SEDIMENTATION RATE: Sed Rate: 22 mm/hr — ABNORMAL HIGH (ref 0–20)

## 2020-01-16 LAB — IBC + FERRITIN
Ferritin: 124.1 ng/mL (ref 10.0–291.0)
Iron: 117 ug/dL (ref 42–145)
Saturation Ratios: 25.5 % (ref 20.0–50.0)
Transferrin: 328 mg/dL (ref 212.0–360.0)

## 2020-01-16 LAB — THYROID PANEL WITH TSH
Free Thyroxine Index: 1.8 (ref 1.4–3.8)
T3 Uptake: 28 % (ref 22–35)
T4, Total: 6.3 ug/dL (ref 5.1–11.9)
TSH: 2.15 mIU/L

## 2020-01-16 LAB — ANA: Anti Nuclear Antibody (ANA): NEGATIVE

## 2020-01-17 ENCOUNTER — Other Ambulatory Visit: Payer: Medicare Other

## 2020-01-18 ENCOUNTER — Ambulatory Visit (INDEPENDENT_AMBULATORY_CARE_PROVIDER_SITE_OTHER): Payer: Medicare Other

## 2020-01-18 ENCOUNTER — Other Ambulatory Visit: Payer: Self-pay

## 2020-01-18 ENCOUNTER — Telehealth: Payer: Self-pay

## 2020-01-18 DIAGNOSIS — T8332XA Displacement of intrauterine contraceptive device, initial encounter: Secondary | ICD-10-CM | POA: Diagnosis not present

## 2020-01-18 DIAGNOSIS — R102 Pelvic and perineal pain unspecified side: Secondary | ICD-10-CM

## 2020-01-18 DIAGNOSIS — R109 Unspecified abdominal pain: Secondary | ICD-10-CM | POA: Diagnosis not present

## 2020-01-18 DIAGNOSIS — K7689 Other specified diseases of liver: Secondary | ICD-10-CM | POA: Diagnosis not present

## 2020-01-18 DIAGNOSIS — Z9049 Acquired absence of other specified parts of digestive tract: Secondary | ICD-10-CM | POA: Diagnosis not present

## 2020-01-18 DIAGNOSIS — N858 Other specified noninflammatory disorders of uterus: Secondary | ICD-10-CM | POA: Diagnosis not present

## 2020-01-18 DIAGNOSIS — R140 Abdominal distension (gaseous): Secondary | ICD-10-CM | POA: Diagnosis not present

## 2020-01-18 NOTE — Progress Notes (Signed)
Spoke with patient and explained results and provider's comments and recommendations. She verbalized understanding and will get in contact wit GYN.  GYN was closed but I was able to leave a detailed message with the after hour answering service.

## 2020-01-18 NOTE — Telephone Encounter (Signed)
 Imaging- call report-  Malpositioned IUD in the lower uterine segment extending into the cervix and likely upper vagina. Correlation with pelvic exam and gyn consultation is suggested

## 2020-01-18 NOTE — Telephone Encounter (Signed)
LM requesting call back to discuss results.  GYN office is closed on Friday afternoons, will attempt to contact Monday.

## 2020-01-18 NOTE — Telephone Encounter (Signed)
Pt needs to see gyn -- she sees Dr Ihor Dow across the hall Her IUD is in the wrong place---  she needs to use condoms if she is sexually active  Please call across the hall and let them know and let pt know too

## 2020-01-18 NOTE — Telephone Encounter (Signed)
Spoke to patient and advised of results and provider's advise. She verbalized understanding.   Called GYN but closed for the day, left message for Dr. Ihor Dow with patient's information.

## 2020-01-21 ENCOUNTER — Other Ambulatory Visit (HOSPITAL_BASED_OUTPATIENT_CLINIC_OR_DEPARTMENT_OTHER): Payer: Medicare Other

## 2020-01-21 NOTE — Telephone Encounter (Signed)
Spoke with GYN office, they have attempted to contact patient for appointment. Awaiting pts return call.

## 2020-01-23 ENCOUNTER — Ambulatory Visit (INDEPENDENT_AMBULATORY_CARE_PROVIDER_SITE_OTHER): Payer: Medicare Other | Admitting: Obstetrics & Gynecology

## 2020-01-23 ENCOUNTER — Encounter: Payer: Self-pay | Admitting: Obstetrics & Gynecology

## 2020-01-23 ENCOUNTER — Other Ambulatory Visit: Payer: Self-pay

## 2020-01-23 VITALS — BP 128/84 | HR 82 | Ht 61.0 in | Wt 249.0 lb

## 2020-01-23 DIAGNOSIS — R102 Pelvic and perineal pain unspecified side: Secondary | ICD-10-CM

## 2020-01-23 DIAGNOSIS — T8332XD Displacement of intrauterine contraceptive device, subsequent encounter: Secondary | ICD-10-CM

## 2020-01-23 DIAGNOSIS — G8929 Other chronic pain: Secondary | ICD-10-CM

## 2020-01-23 DIAGNOSIS — Z30433 Encounter for removal and reinsertion of intrauterine contraceptive device: Secondary | ICD-10-CM | POA: Diagnosis not present

## 2020-01-23 MED ORDER — LEVONORGESTREL 20 MCG/24HR IU IUD
INTRAUTERINE_SYSTEM | Freq: Once | INTRAUTERINE | Status: AC
  Administered 2020-01-23: 1 via INTRAUTERINE

## 2020-01-23 NOTE — Progress Notes (Signed)
History:  34 y.o. G0P0000 here today for eval of pelvic pain and [place] finding that shows malposition of the IUD. Pt has a Liletta IUD placed 06/08/2018. She has started having pain and had a few very heavy cycles. She reports that since her last visit, she is engaged to be married.  She wants her IUD removed and replaced.   The following portions of the patient's history were reviewed and updated as appropriate: allergies, current medications, past family history, past medical history, past social history, past surgical history and problem list.  Review of Systems:  Pertinent items are noted in HPI.    Objective:  Physical Exam Blood pressure 128/84, pulse 82, height 5\' 1"  (1.549 m), weight 249 lb (112.9 kg), last menstrual period 01/16/2020.  CONSTITUTIONAL: Well-developed, well-nourished female in no acute distress.  HENT:  Normocephalic, atraumatic EYES: Conjunctivae and EOM are normal. No scleral icterus.  NECK: Normal range of motion SKIN: Skin is warm and dry. No rash noted. Not diaphoretic.No pallor. [place]: Alert and oriented to person, place, and time. Normal coordination.  Abd: Soft, nontender and nondistended Pelvic: Normal appearing external genitalia; normal appearing vaginal mucosa and cervix.  The tip of the IUD is seen coming out of hte cervix. Normal discharge.  Small uterus, no other palpable masses, no uterine or adnexal tenderness  GYN procedure:  Patient identified, informed consent performed.  Discussed risks of irregular bleeding, cramping, infection, malpositioning or misplacement of the IUD outside the uterus which may require further procedures. Time out was performed.  Urine pregnancy test negative.  Patient was in the dorsal lithotomy position, normal external genitalia was noted.  A speculum was placed in the patient's vagina, normal discharge was noted, no lesions. The multiparous cervix was visualized, no lesions, no abnormal discharge;  and the cervix was  swabbed with Betadine using scopettes. The strings of the IUD were grasped and pulled using ring forceps.  The IUD was successfully removed in its entirety.  Patient tolerated the procedure well.  At this point the cervix was visualized.  Cleaned with Betadine x 2.  Grasped anteriorly with a single tooth tenaculum.  Uterus sounded to 7 cm.  Mirena IUD placed per manufacturer's recommendations.  Strings trimmed to 3 cm. Tenaculum was removed, good hemostasis noted.  Patient tolerated procedure well.   Labs and Imaging US Abdomen Complete  Result Date: 01/18/2020 CLINICAL DATA:  Abdominal bloating. EXAM: ABDOMEN ULTRASOUND COMPLETE COMPARISON:  August 07, 2009. FINDINGS: Gallbladder: Status post cholecystectomy. Common bile duct: Diameter: 3 mm which is within normal limits. Liver: No focal lesion identified. Increased echogenicity of hepatic parenchyma is noted suggesting hepatic steatosis. Portal vein is patent on color Doppler imaging with normal direction of blood flow towards the liver. IVC: No abnormality visualized. Pancreas: Visualized portion unremarkable. Spleen: Size and appearance within normal limits. Right Kidney: Length: 11.8 cm. Echogenicity within normal limits. No mass or hydronephrosis visualized. Left Kidney: Length: 11.9 cm. Echogenicity within normal limits. No mass or hydronephrosis visualized. Abdominal aorta: No aneurysm visualized. Other findings: None. IMPRESSION: Status post cholecystectomy. Probable hepatic steatosis. No other abnormality seen in the abdomen. Electronically Signed   By: Marijo Conception M.D.   On: 01/18/2020 12:39   US Pelvic Complete With Transvaginal  Result Date: 01/18/2020 CLINICAL DATA:  Abdominal pain and bloating for 1 year, history of ovarian cyst removal and IUD. EXAM: TRANSABDOMINAL AND TRANSVAGINAL ULTRASOUND OF PELVIS TECHNIQUE: Both transabdominal and transvaginal ultrasound examinations of the pelvis were performed. Transabdominal technique was  performed for global imaging of the pelvis including uterus, ovaries, adnexal regions, and pelvic cul-de-sac. It was necessary to proceed with endovaginal exam following the transabdominal exam to visualize the uterus adnexa and position of the IUD. COMPARISON:  CT study from 20/10 FINDINGS: Uterus Measurements: 7.5 x 3.4 x 4.7 cm = volume: 63 mL. Mildly heterogeneous appearance of the myometrium. Endometrium Thickness: 3.2 mm. Mild increased vascularity of the endometrium in the midportion without visible lesion, may somehow related to malpositioned IUD. IUD is oriented transversely in the lower uterine segment and the arms are in the plane are slightly off the plane of the endometrium, the main portion of the IUD extending obliquely into the cervix and passing likely and upper vagina. Right ovary Measurements: 4.8 x 3.2 x 2.9 cm = volume: 23 mL. Normal appearance/no adnexal mass. Left ovary Measurements: 3.7 x 1.7 x 1.9 cm = volume: 6 mL. Normal appearance/no adnexal mass. Other findings Trace free fluid IMPRESSION: 1. Malpositioned IUD in the lower uterine segment extending into the cervix and likely upper vagina. Correlation with pelvic exam and gyn consultation is suggested. 2. Probable adenomyosis. 3. These results will be called to the ordering clinician or representative by the Radiologist Assistant, and communication documented in the PACS or [employer]. Electronically Signed   By: Zetta Bills M.D.   On: 01/18/2020 13:17    Assessment & Plan:  Pelvic pain. Malpositioned IUD. Contraception education reviewed with pt.  Reviewed all forms of birth control options available including abstinence; over the counter/barrier methods; hormonal contraceptive medication including pill, patch, ring, injection,contraceptive implant; hormonal and nonhormonal IUDs; permanent sterilization options including vasectomy and the various tubal sterilization modalities. Risks and benefits reviewed.  Questions were  answered.  Information was given to patient to review.   LnIUD removed and replaced.  Patient was given post-procedure instructions.  Patient was asked to follow up in 4 weeks for IUD check.  Malene Blaydes L. Harraway-Smith, M.D., Cherlynn June

## 2020-01-24 ENCOUNTER — Encounter: Payer: Self-pay | Admitting: Obstetrics & Gynecology

## 2020-01-31 ENCOUNTER — Telehealth: Payer: Self-pay | Admitting: Family Medicine

## 2020-01-31 NOTE — Telephone Encounter (Signed)
Please advise. Pt seen on 01/23/2020 at GYN. Nothing in the notes points odd behavior. How would you like to proceed?

## 2020-01-31 NOTE — Telephone Encounter (Signed)
Psych--- pt or guardian needs to make appointment-- we can give names and numbers of psych in [place] but they may need a psych closer to her --- [facility]  With fatty liver --- normally it just means weight loss first

## 2020-01-31 NOTE — Telephone Encounter (Signed)
Patient's requesting that PCP place referral to :   Psychiatrist and Gastroenterologist. Per patient's father pt is having Bipolar Episodes. Patient also has a fatty liver.  Please Advise

## 2020-02-07 NOTE — Telephone Encounter (Signed)
Spoke with patient. Pt given [facility] numbers

## 2020-02-18 ENCOUNTER — Encounter: Payer: Self-pay | Admitting: Family Medicine

## 2020-02-27 ENCOUNTER — Ambulatory Visit: Payer: Medicare Other | Admitting: Obstetrics & Gynecology

## 2020-02-28 ENCOUNTER — Ambulatory Visit (INDEPENDENT_AMBULATORY_CARE_PROVIDER_SITE_OTHER): Payer: Medicare Other | Admitting: Allergy & Immunology

## 2020-02-28 ENCOUNTER — Other Ambulatory Visit: Payer: Self-pay

## 2020-02-28 ENCOUNTER — Encounter: Payer: Self-pay | Admitting: Allergy & Immunology

## 2020-02-28 VITALS — BP 124/76 | HR 91 | Temp 99.3°F | Resp 18 | Ht 62.0 in | Wt 248.8 lb

## 2020-02-28 DIAGNOSIS — L2089 Other atopic dermatitis: Secondary | ICD-10-CM

## 2020-02-28 DIAGNOSIS — J302 Other seasonal allergic rhinitis: Secondary | ICD-10-CM

## 2020-02-28 DIAGNOSIS — J4530 Mild persistent asthma, uncomplicated: Secondary | ICD-10-CM

## 2020-02-28 DIAGNOSIS — J3089 Other allergic rhinitis: Secondary | ICD-10-CM | POA: Diagnosis not present

## 2020-02-28 MED ORDER — LEVOCETIRIZINE DIHYDROCHLORIDE 5 MG PO TABS: 5.0000 mg | ORAL_TABLET | Freq: Two times a day (BID) | ORAL | 2 refills | Status: DC

## 2020-02-28 MED ORDER — MONTELUKAST SODIUM 10 MG PO TABS: 10.0000 mg | ORAL_TABLET | Freq: Every day | ORAL | 2 refills | Status: DC

## 2020-02-28 NOTE — Patient Instructions (Signed)
1. Mild persistent asthma, uncomplicated - Lung testing looks great today. - We are not going to make any medication changes today. - Molds were not very reactive, but even in people not allergic to mold, the intense number of spores can cause breathing difficulties (I am VERY glad you are out of the environment). - Spacer use reviewed. - Daily controller medication(s): Singulair 10 mg daily - Prior to physical activity: albuterol 2 puffs 10-15 minutes before physical activity. - Rescue medications: albuterol 4 puffs every 4-6 hours as needed - Asthma control goals:  * Full participation in all desired activities (may need albuterol before activity) * Albuterol use two time or less a week on average (not counting use with activity) * Cough interfering with sleep two time or less a month * Oral steroids no more than once a year * No hospitalizations  2. Chronic rhinitis - Testing today showed: grasses, weeds, trees, indoor molds, outdoor molds, dust mites, cat and cockroach - Copy of test results provided.  - Avoidance measures provided. - Stop taking: Zyrtec - Continue with: Xyzal (levocetirizine) 5mg  tablet BUT INCREASE TO TWICE daily and Singulair (montelukast) 10mg  daily - You can use an extra dose of the antihistamine, if needed, for breakthrough symptoms.  - Consider nasal saline rinses 1-2 times daily to remove allergens from the nasal cavities as well as help with mucous clearance (this is especially helpful to do before the nasal sprays are given) - Strongly consider allergy shots as a means of long-term control. - Allergy shots "re-train" and "reset" the immune system to ignore environmental allergens and decrease the resulting immune response to those allergens (sneezing, itchy watery eyes, runny nose, nasal congestion, etc).    - Allergy shots improve symptoms in 75-85% of patients.  - We can discuss more at the next appointment if the medications are not working for you.  3.  Flexural atopic dermatitis - Skin looks great today. - We are not going to add anything today.  4. Return in about 3 months (around 05/29/2020).    Please inform us of any Emergency Department visits, hospitalizations, or changes in symptoms. Call us before going to the ED for breathing or allergy symptoms since we might be able to fit you in for a sick visit. Feel free to contact us anytime with any questions, problems, or concerns.  It was a pleasure to meet you today!  Websites that have reliable patient information: 1. American Academy of Asthma, Allergy, and Immunology: www.aaaai.org 2. Food Allergy Research and Education (FARE): foodallergy.org 3. Mothers of Asthmatics: http://www.asthmacommunitynetwork.org 4. American College of Allergy, Asthma, and Immunology: www.acaai.org   COVID-19 Vaccine Information can be found at: [email or web address] For questions related to vaccine distribution or appointments, please email [email or web address] or call [phone].     "Like" [place] on Facebook and Instagram for our latest updates!        Make sure you are registered to vote! If you have moved or changed any of your contact information, you will need to get this updated before voting!  In some cases, you MAY be able to register to vote online: [email or web address]     Reducing Pollen Exposure  The American Academy of Allergy, Asthma and Immunology suggests the following steps to reduce your exposure to pollen during allergy seasons.    1. Do not hang sheets or clothing out to dry; pollen may collect on these items. 2. Do not mow lawns or spend time around freshly cut grass; mowing stirs up pollen.  3. Keep windows closed at night.  Keep car windows closed while driving. 4. Minimize morning activities outdoors, a time when pollen counts are usually at their highest. 5. Stay indoors as much as  possible when pollen counts or humidity is high and on windy days when pollen tends to remain in the air longer. 6. Use air conditioning when possible.  Many air conditioners have filters that trap the pollen spores. 7. Use a HEPA room air filter to remove pollen form the indoor air you breathe.  Control of Mold Allergen   Mold and fungi can grow on a variety of surfaces provided certain temperature and moisture conditions exist.  Outdoor molds grow on plants, decaying vegetation and soil.  The major outdoor mold, Alternaria and Cladosporium, are found in very high numbers during hot and dry conditions.  Generally, a late Summer - Fall peak is seen for common outdoor fungal spores.  Rain will temporarily lower outdoor mold spore count, but counts rise rapidly when the rainy period ends.  The most important indoor molds are Aspergillus and Penicillium.  Dark, humid and poorly ventilated basements are ideal sites for mold growth.  The next most common sites of mold growth are the bathroom and the kitchen.  Outdoor (Seasonal) Mold Control  Positive outdoor molds via skin testing: Alternaria, Cladosporium, Bipolaris (Helminthsporium), Drechslera (Curvalaria) and Mucor  1. Use air conditioning and keep windows closed 2. Avoid exposure to decaying vegetation. 3. Avoid leaf raking. 4. Avoid grain handling. 5. Consider wearing a face mask if working in moldy areas.  6.   Indoor (Perennial) Mold Control   Positive indoor molds via skin testing: Fusarium, Aureobasidium (Pullulara) and Rhizopus  1. Maintain humidity below 50%. 2. Clean washable surfaces with 5% bleach solution. 3. Remove sources e.g. contaminated carpets.     Control of Dust Mite Allergen    Dust mites play a major role in allergic asthma and rhinitis.  They occur in environments with high humidity wherever human skin is found.  Dust mites absorb humidity from the atmosphere (ie, they do not drink) and feed on organic matter  (including shed human and animal skin).  Dust mites are a microscopic type of insect that you cannot see with the naked eye.  High levels of dust mites have been detected from mattresses, pillows, carpets, upholstered furniture, bed covers, clothes, soft toys and any woven material.  The principal allergen of the dust mite is found in its feces.  A gram of dust may contain 1,000 mites and 250,000 fecal particles.  Mite antigen is easily measured in the air during house cleaning activities.  Dust mites do not bite and do not cause harm to humans, other than by triggering allergies/asthma.    Ways to decrease your exposure to dust mites in your home:  1. Encase mattresses, box springs and pillows with a mite-impermeable barrier or cover   2. Wash sheets, blankets and drapes weekly in hot water (130 F) with detergent and dry them in a dryer on the hot setting.  3. Have the room cleaned frequently with a vacuum cleaner and a damp dust-mop.  For carpeting or rugs, vacuuming with a vacuum cleaner equipped with a high-efficiency particulate air (HEPA) filter.  The dust mite allergic individual should not be in a room which is being cleaned and should wait 1 hour after cleaning before going into the room. 4. Do not sleep on upholstered furniture (eg, couches).   5. If possible removing carpeting, upholstered furniture  and drapery from the home is ideal.  Horizontal blinds should be eliminated in the rooms where the person spends the most time (bedroom, study, television room).  Washable vinyl, roller-type shades are optimal. 6. Remove all non-washable stuffed toys from the bedroom.  Wash stuffed toys weekly like sheets and blankets above.   7. Reduce indoor humidity to less than 50%.  Inexpensive humidity monitors can be purchased at most hardware stores.  Do not use a humidifier as can make the problem worse and are not recommended.  Control of Dog or Cat Allergen  Avoidance is the best way to manage a dog or  cat allergy. If you have a dog or cat and are allergic to dog or cats, consider removing the dog or cat from the home. If you have a dog or cat but don't want to find it a new home, or if your family wants a pet even though someone in the household is allergic, here are some strategies that may help keep symptoms at bay:  1. Keep the pet out of your bedroom and restrict it to only a few rooms. Be advised that keeping the dog or cat in only one room will not limit the allergens to that room. 2. Don't pet, hug or kiss the dog or cat; if you do, wash your hands with soap and water. 3. High-efficiency particulate air (HEPA) cleaners run continuously in a bedroom or living room can reduce allergen levels over time. 4. Regular use of a high-efficiency vacuum cleaner or a central vacuum can reduce allergen levels. 5. Giving your dog or cat a bath at least once a week can reduce airborne allergen.  Control of Cockroach Allergen  Cockroach allergen has been identified as an important cause of acute attacks of asthma, especially in urban settings.  There are fifty-five species of cockroach that exist in the [place], however only three, the [place], [place] species produce allergen that can affect patients with Asthma.  Allergens can be obtained from fecal particles, egg casings and secretions from cockroaches.    1. Remove food sources. 2. Reduce access to water. 3. Seal access and entry points. 4. Spray runways with 0.5-1% Diazinon or Chlorpyrifos 5. Blow boric acid power under stoves and refrigerator. 6. Place bait stations (hydramethylnon) at feeding sites.  Allergy Shots   Allergies are the result of a chain reaction that starts in the immune system. Your immune system controls how your body defends itself. For instance, if you have an allergy to pollen, your immune system identifies pollen as an invader or allergen. Your immune system overreacts by producing antibodies called  Immunoglobulin E (IgE). These antibodies travel to cells that release chemicals, causing an allergic reaction.  The concept behind allergy immunotherapy, whether it is received in the form of shots or tablets, is that the immune system can be desensitized to specific allergens that trigger allergy symptoms. Although it requires time and patience, the payback can be long-term relief.  How Do Allergy Shots Work?  Allergy shots work much like a vaccine. Your body responds to injected amounts of a particular allergen given in increasing doses, eventually developing a resistance and tolerance to it. Allergy shots can lead to decreased, minimal or no allergy symptoms.  There generally are two phases: build-up and maintenance. Build-up often ranges from three to six months and involves receiving injections with increasing amounts of the allergens. The shots are typically given once or twice a week, though more rapid build-up  schedules are sometimes used.  The maintenance phase begins when the most effective dose is reached. This dose is different for each person, depending on how allergic you are and your response to the build-up injections. Once the maintenance dose is reached, there are longer periods between injections, typically two to four weeks.  Occasionally doctors give cortisone-type shots that can temporarily reduce allergy symptoms. These types of shots are different and should not be confused with allergy immunotherapy shots.  Who Can Be Treated with Allergy Shots?  Allergy shots may be a good treatment approach for people with allergic rhinitis (hay fever), allergic asthma, conjunctivitis (eye allergy) or stinging insect allergy.   Before deciding to begin allergy shots, you should consider:  . The length of allergy season and the severity of your symptoms . Whether medications and/or changes to your environment can control your symptoms . Your desire to avoid long-term medication use .  Time: allergy immunotherapy requires a major time commitment . Cost: may vary depending on your insurance coverage  Allergy shots for children age 18 and older are effective and often well tolerated. They might prevent the onset of new allergen sensitivities or the progression to asthma.  Allergy shots are not started on patients who are pregnant but can be continued on patients who become pregnant while receiving them. In some patients with other medical conditions or who take certain common medications, allergy shots may be of risk. It is important to mention other medications you talk to your allergist.   When Will I Feel Better?  Some may experience decreased allergy symptoms during the build-up phase. For others, it may take as long as 12 months on the maintenance dose. If there is no improvement after a year of maintenance, your allergist will discuss other treatment options with you.  If you aren't responding to allergy shots, it may be because there is not enough dose of the allergen in your vaccine or there are missing allergens that were not identified during your allergy testing. Other reasons could be that there are high levels of the allergen in your environment or major exposure to non-allergic triggers like tobacco smoke.  What Is the Length of Treatment?  Once the maintenance dose is reached, allergy shots are generally continued for three to five years. The decision to stop should be discussed with your allergist at that time. Some people may experience a permanent reduction of allergy symptoms. Others may relapse and a longer course of allergy shots can be considered.  What Are the Possible Reactions?  The two types of adverse reactions that can occur with allergy shots are local and systemic. Common local reactions include very mild redness and swelling at the injection site, which can happen immediately or several hours after. A systemic reaction, which is less common,  affects the entire body or a particular body system. They are usually mild and typically respond quickly to medications. Signs include increased allergy symptoms such as sneezing, a stuffy nose or hives.  Rarely, a serious systemic reaction called anaphylaxis can develop. Symptoms include swelling in the throat, wheezing, a feeling of tightness in the chest, nausea or dizziness. Most serious systemic reactions develop within 30 minutes of allergy shots. This is why it is strongly recommended you wait in your doctor's office for 30 minutes after your injections. Your allergist is trained to watch for reactions, and his or her staff is trained and equipped with the proper medications to identify and treat them.  Who Should  Administer Allergy Shots?  The preferred location for receiving shots is your prescribing allergist's office. Injections can sometimes be given at another facility where the physician and staff are trained to recognize and treat reactions, and have received instructions by your prescribing allergist.

## 2020-02-28 NOTE — Progress Notes (Signed)
NEW PATIENT  Date of Service/Encounter:  02/28/20  Referring provider: Carollee Herter, Alferd Apa, DO   Assessment:   Mild persistent asthma, uncomplicated  Seasonal and perennial allergic rhinitis (grasses, weeds, trees, indoor molds, outdoor molds, dust mites, cat and cockroach)  Flexural atopic dermatitis  Disabled status due to seizure disorder  Plan/Recommendations:   1. Mild persistent asthma, uncomplicated - Lung testing looks great today. - We are not going to make any medication changes today. - Molds were not very reactive, but even in people not allergic to mold, the intense number of spores can cause breathing difficulties (I am VERY glad you are out of the environment). - Spacer use reviewed. - Daily controller medication(s): Singulair 10 mg daily - Prior to physical activity: albuterol 2 puffs 10-15 minutes before physical activity. - Rescue medications: albuterol 4 puffs every 4-6 hours as needed - Asthma control goals:  * Full participation in all desired activities (may need albuterol before activity) * Albuterol use two time or less a week on average (not counting use with activity) * Cough interfering with sleep two time or less a month * Oral steroids no more than once a year * No hospitalizations  2. Chronic rhinitis - Testing today showed: grasses, weeds, trees, indoor molds, outdoor molds, dust mites, cat and cockroach - Copy of test results provided.  - Avoidance measures provided. - Stop taking: Zyrtec - Continue with: Xyzal (levocetirizine) 5mg  tablet BUT INCREASE TO TWICE daily and Singulair (montelukast) 10mg  daily - You can use an extra dose of the antihistamine, if needed, for breakthrough symptoms.  - Consider nasal saline rinses 1-2 times daily to remove allergens from the nasal cavities as well as help with mucous clearance (this is especially helpful to do before the nasal sprays are given) - Strongly consider allergy shots as a means of  long-term control. - Allergy shots "re-train" and "reset" the immune system to ignore environmental allergens and decrease the resulting immune response to those allergens (sneezing, itchy watery eyes, runny nose, nasal congestion, etc).    - Allergy shots improve symptoms in 75-85% of patients.  - We can discuss more at the next appointment if the medications are not working for you.  3. Flexural atopic dermatitis - Skin looks great today. - We are not going to add anything today.  4. Return in about 3 months (around 05/29/2020).   Subjective:   Gina Cameron is a 34 y.o. female presenting today for evaluation of  Chief Complaint  Patient presents with  . Allergic Rhinitis   . Asthma    Gina Cameron has a history of the following: Patient Active Problem List   Diagnosis Date Noted  . Seasonal and perennial allergic rhinitis 02/29/2020  . Mild persistent asthma, uncomplicated 80/32/1224  . Seasonal allergies 01/15/2020  . Pelvic pain 01/15/2020  . Abdominal bloating 01/15/2020  . Swelling 01/15/2020  . Bipolar depression ([place]) 10/17/2019  . Allergies 10/17/2019  . Class 3 severe obesity with serious comorbidity and body mass index (BMI) of 40.0 to 44.9 in adult [facility]) 10/17/2019  . Developmental delay 02/23/2018  . Seizures ([place]) 02/23/2018  . Hirsutism 02/23/2018  . Cyst of ovary 02/23/2018  . Complex tear of medial meniscus of right knee as current injury 03/01/2016  . 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 History of brain disorder 08/06/2014  . Migraine   . GERD 01/14/2010  . OBSTRUCTIVE SLEEP APNEA 10/30/2009  . Migraine without aura 06/10/2009  . ECZEMA 03/05/2009  .  Depression with anxiety 07/24/2008  . MILD MENTAL RETARDATION 01/15/2008  . 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 Congenital hamartoma ([place]) 01/15/2008  . MORBID OBESITY 01/10/2008  . Flexural atopic dermatitis 11/30/2006    History obtained from: chart review and patient.  Gina Cameron was referred by Carollee Herter, Alferd Apa, DO.     Gina Cameron is a 34 y.o. female presenting for an evaluation of possible mold allergy.   Asthma/Respiratory Symptom History: She was lviing a house with her ex boyfriend. His ex roomate was the maintenance man for the house. There was a lose board in the celing and when he pulled itou to tr yto see whate was going on, there was mold on the top of the board. There were spores throughout the house. They noticed this a couple of months ago. She moved out in July. She is better now. She is living with her parents again and breathing better at this point. She does have some problems with asthma attacks when she smells stuff. Prior to this, she had no asthma at all. She is on albuterol as needed now.   Allergic Rhinitis Symptom History: She has been on Xyzal for a while, She was started on the montelukast more recently. She has never been tested to her knowledge.   Eczema Symptom History: She does have a history of eczema. She manages this with emollients as needed. She has never needed prednisone or antibiotics for her skin.  She is currently on disability for her seizure disorder. She does work part time as a Quarry manager.   Otherwise, there is no history of other atopic diseases, including asthma, drug allergies, stinging insect allergies, urticaria or contact dermatitis. There is no significant infectious history. Vaccinations are up to date.    Past Medical History: Patient Active Problem List   Diagnosis Date Noted  . Seasonal and perennial allergic rhinitis 02/29/2020  . Mild persistent asthma, uncomplicated 33/29/5188  . Seasonal allergies 01/15/2020  . Pelvic pain 01/15/2020  . Abdominal bloating 01/15/2020  . Swelling 01/15/2020  . Bipolar depression ([place]) 10/17/2019  . Allergies 10/17/2019  . Class 3 severe obesity with serious comorbidity and body mass index (BMI) of 40.0 to 44.9 in adult [facility]) 10/17/2019  . Developmental delay 02/23/2018  .  Seizures ([place]) 02/23/2018  . Hirsutism 02/23/2018  . Cyst of ovary 02/23/2018  . Complex tear of medial meniscus of right knee as current injury 03/01/2016  . 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 History of brain disorder 08/06/2014  . Migraine   . GERD 01/14/2010  . OBSTRUCTIVE SLEEP APNEA 10/30/2009  . Migraine without aura 06/10/2009  . ECZEMA 03/05/2009  . Depression with anxiety 07/24/2008  . MILD MENTAL RETARDATION 01/15/2008  . 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 Congenital hamartoma ([place]) 01/15/2008  . MORBID OBESITY 01/10/2008  . Flexural atopic dermatitis 11/30/2006    Medication List:  Allergies as of 02/28/2020      Reactions   Lidocaine-epinephrine Shortness Of Breath   REACTION: wheezing   Mepivacaine Hcl Shortness Of Breath   REACTION: wheezing   Diphenhydramine Hcl Swelling   Penicillins Other (See Comments)   Triggers seizures and coma   Latex Rash      Medication List       Accurate as of February 28, 2020 11:59 PM. If you have any questions, ask your nurse or doctor.        amitriptyline 10 MG tablet Commonly known as: ELAVIL TK 1 T PO QHS  cetirizine 10 MG tablet Commonly known as: ZYRTEC Take 10 mg by mouth daily.   FLUoxetine 20 MG capsule Commonly known as: PROZAC Take 1 capsule (20 mg total) by mouth daily.   furosemide 40 MG tablet Commonly known as: LASIX Take 1 tablet (40 mg total) by mouth daily.   ibuprofen 800 MG tablet Commonly known as: ADVIL Take 800 mg by mouth every 8 (eight) hours as needed.   levocetirizine 5 MG tablet Commonly known as: XYZAL TAKE 2 TABLETS(10 MG) BY MOUTH EVERY MORNING What changed: Another medication with the same name was added. Make sure you understand how and when to take each. Changed by: Valentina Shaggy, MD   levocetirizine 5 MG tablet Commonly known as: XYZAL Take 1 tablet (5 mg total) by mouth in the morning and at bedtime. What changed: You were already taking a  medication with the same name, and this prescription was added. Make sure you understand how and when to take each. Changed by: Valentina Shaggy, MD   montelukast 10 MG tablet Commonly known as: SINGULAIR Take 1 tablet (10 mg total) by mouth at bedtime. What changed: Another medication with the same name was added. Make sure you understand how and when to take each. Changed by: Valentina Shaggy, MD   montelukast 10 MG tablet Commonly known as: SINGULAIR Take 1 tablet (10 mg total) by mouth at bedtime. What changed: You were already taking a medication with the same name, and this prescription was added. Make sure you understand how and when to take each. Changed by: Valentina Shaggy, MD   omeprazole 20 MG capsule Commonly known as: PRILOSEC TAKE 1 CAPSULE(20 MG) BY MOUTH DAILY       Birth History: non-contributory  Developmental History: non-contributory  Past Surgical History: Past Surgical History:  Procedure Laterality Date  . CHOLECYSTECTOMY    . OVARIAN CYST REMOVAL    . WISDOM TOOTH EXTRACTION       Family History: Family History  Problem Relation Age of Onset  . Hypertension Father   . Hyperlipidemia Father   . Depression Father   . Hypertension Mother   . Heart disease Mother   . Hypertension Maternal Grandmother   . Diabetes Maternal Grandmother   . Hyperlipidemia Maternal Grandmother      Social History: Nasya lives at home with her parents.  They live in an apartment with wood throughout the home.  They have electric heating and central cooling.  There is a cat inside of the home.  There are dust mite covers on the bedding.  There is no tobacco exposure.  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  She does not have a HEPA filter in the home.  She does not live near an interstate or industrial area.   Review of Systems  Constitutional: Negative.  Negative for chills, fever, malaise/fatigue and weight loss.  HENT: Positive for  congestion and sinus pain. Negative for ear discharge and ear pain.   Eyes: Negative for pain, discharge and redness.  Respiratory: Positive for cough (Improved since moving out of the moldy apartment.) and shortness of breath (Improved since moving out of the moldy apartment.). Negative for sputum production and wheezing.   Cardiovascular: Negative.  Negative for chest pain and palpitations.  Gastrointestinal: Negative for abdominal pain, constipation, diarrhea, heartburn, nausea and vomiting.  Skin: Negative.  Negative for itching and rash.  Neurological: Negative for dizziness and headaches.  Endo/Heme/Allergies: Negative for environmental allergies. Does  not bruise/bleed easily.       Objective:   Blood pressure 124/76, pulse 91, temperature 99.3 F (37.4 C), temperature source Temporal, resp. rate 18, height 5\' 2"  (1.575 m), weight 248 lb 12.8 oz (112.9 kg), SpO2 98 %. Body mass index is 45.51 kg/m.   Physical Exam:   Physical Exam Constitutional:      Appearance: She is well-developed.     Comments: Very pleasant female.  Cooperative with the exam.  HENT:     Head: Normocephalic and atraumatic. Hair is normal.     Right Ear: Tympanic membrane, ear canal and external ear normal. No drainage, swelling or tenderness. Tympanic membrane is not injected, scarred, erythematous, retracted or bulging.     Left Ear: Tympanic membrane, ear canal and external ear normal. No drainage, swelling or tenderness. Tympanic membrane is not injected, scarred, erythematous, retracted or bulging.     Nose: No nasal deformity, septal deviation, mucosal edema or rhinorrhea.     Right Turbinates: Enlarged, swollen and pale.     Left Turbinates: Enlarged, swollen and pale.     Right Sinus: No maxillary sinus tenderness or frontal sinus tenderness.     Left Sinus: No maxillary sinus tenderness or frontal sinus tenderness.     Comments: There is cobblestoning in the posterior oropharynx.     Mouth/Throat:     Mouth: Mucous membranes are not pale and not dry.     Pharynx: Uvula midline.  Eyes:     General:        Right eye: No discharge.        Left eye: No discharge.     Conjunctiva/sclera: Conjunctivae normal.     Right eye: Right conjunctiva is not injected. No chemosis.    Left eye: Left conjunctiva is not injected. No chemosis.    Pupils: Pupils are equal, round, and reactive to light.  Cardiovascular:     Rate and Rhythm: Normal rate and regular rhythm.     Heart sounds: Normal heart sounds.  Pulmonary:     Effort: Pulmonary effort is normal. No tachypnea, accessory muscle usage or respiratory distress.     Breath sounds: Normal breath sounds. No wheezing, rhonchi or rales.     Comments: Moving air well in all lung fields.  No increased work of breathing. Chest:     Chest wall: No tenderness.  Abdominal:     Tenderness: There is no abdominal tenderness. There is no guarding or rebound.  Lymphadenopathy:     Head:     Right side of head: No submandibular, tonsillar or occipital adenopathy.     Left side of head: No submandibular, tonsillar or occipital adenopathy.     Cervical: No cervical adenopathy.  Skin:    Coloration: Skin is not pale.     Findings: No abrasion, erythema, petechiae or rash. Rash is not papular, urticarial or vesicular.     Comments: No eczematous lesions noted.  Neurological:     Mental Status: She is alert.      Diagnostic studies:    Spirometry: results normal (FEV1: 3.03/120%, FVC: 3.52/119%, FEV1/FVC: 86%).    Spirometry consistent with normal pattern.   Allergy Studies:     Airborne Adult Perc - 02/28/20 0936    Time Antigen Placed 2353    Allergen Manufacturer Lavella Hammock    Location Back    Number of Test 59    Panel 1 Select    1. Control-Buffer 50% Glycerol Negative    2. Control-Histamine  1 mg/ml 2+    3. Albumin saline Negative    4. [place] 3+    5. [place] 4+    6. [place] 3+    7. [place] Blue 3+    8. Meadow  Fescue 3+    9. Perennial Rye 2+    10. Sweet Vernal 2+    11. Timothy Negative    12. Cocklebur Negative    13. Burweed Marshelder Negative    14. Ragweed, short Negative    15. Ragweed, Giant Negative    16. Plantain,  English Negative    17. Lamb's Quarters Negative    18. Sheep Sorrell Negative    19. Rough Pigweed Negative    20. Marsh Elder, Rough Negative    21. Mugwort, Common Negative    22. Ash mix Negative    23. Birch mix Negative    24. Beech American Negative    25. Box, Elder Negative    26. Cedar, red Negative    27. Cottonwood, [place] Negative    28. Elm mix Negative    29. Hickory Negative    30. Maple mix Negative    31. Oak, [place] mix Negative    32. Pecan Pollen Negative    33. Pine mix Negative    34. Sycamore Eastern Negative    35. IXL, Black Pollen Negative    36. Alternaria alternata Negative    37. Cladosporium Herbarum Negative    38. Aspergillus mix Negative    39. Penicillium mix Negative    40. Bipolaris sorokiniana (Helminthosporium) Negative    41. Drechslera spicifera (Curvularia) Negative    42. Mucor plumbeus Negative    43. Fusarium moniliforme Negative    44. Aureobasidium pullulans (pullulara) Negative    45. Rhizopus oryzae Negative    46. Botrytis cinera Negative    47. Epicoccum nigrum Negative    48. Phoma betae Negative    49. Candida Albicans Negative    50. Trichophyton mentagrophytes Negative    51. Mite, D Farinae  5,000 AU/ml Negative    52. Mite, D Pteronyssinus  5,000 AU/ml Negative    53. Cat Hair 10,000 BAU/ml 3+    54.  Dog Epithelia Negative    55. Mixed Feathers Negative    56. Horse Epithelia Negative    57. Cockroach, German Negative    58. Mouse Negative    59. Tobacco Leaf Negative          Intradermal - 02/28/20 1006    Time Antigen Placed 1006    Allergen Manufacturer Lavella Hammock    Location Arm    Number of Test 11    Intradermal Select    Control Negative    Ragweed mix Negative    Weed mix  4+    Tree mix 3+    Mold 1 1+    Mold 2 1+    Mold 3 Negative    Mold 4 1+    Dog Negative    Cockroach 2+    Mite mix 4+           Allergy testing results were read and interpreted by myself, documented by clinical staff.         Salvatore Marvel, MD [facility]

## 2020-02-29 ENCOUNTER — Encounter: Payer: Self-pay | Admitting: Allergy & Immunology

## 2020-02-29 DIAGNOSIS — J302 Other seasonal allergic rhinitis: Secondary | ICD-10-CM | POA: Insufficient documentation

## 2020-02-29 DIAGNOSIS — J4530 Mild persistent asthma, uncomplicated: Secondary | ICD-10-CM | POA: Insufficient documentation

## 2020-03-06 ENCOUNTER — Ambulatory Visit: Payer: Medicare Other | Admitting: Obstetrics & Gynecology

## 2020-03-07 ENCOUNTER — Encounter (HOSPITAL_COMMUNITY): Payer: Self-pay

## 2020-03-07 ENCOUNTER — Ambulatory Visit (HOSPITAL_COMMUNITY)
Admission: EM | Admit: 2020-03-07 | Discharge: 2020-03-07 | Disposition: A | Discharge status: Home / Self Care | Payer: Medicare Other | Attending: Emergency Medicine | Admitting: Emergency Medicine

## 2020-03-07 ENCOUNTER — Other Ambulatory Visit: Payer: Self-pay

## 2020-03-07 DIAGNOSIS — G43909 Migraine, unspecified, not intractable, without status migrainosus: Secondary | ICD-10-CM | POA: Diagnosis not present

## 2020-03-07 MED ORDER — METOCLOPRAMIDE HCL 5 MG/ML IJ SOLN
5.0000 mg | Freq: Once | INTRAMUSCULAR | Status: AC
  Administered 2020-03-07: 5 mg via INTRAMUSCULAR

## 2020-03-07 MED ORDER — DEXAMETHASONE SODIUM PHOSPHATE 10 MG/ML IJ SOLN
INTRAMUSCULAR | Status: AC
  Filled 2020-03-07: qty 1

## 2020-03-07 MED ORDER — DEXAMETHASONE SODIUM PHOSPHATE 10 MG/ML IJ SOLN
10.0000 mg | Freq: Once | INTRAMUSCULAR | Status: AC
  Administered 2020-03-07: 10 mg via INTRAMUSCULAR

## 2020-03-07 MED ORDER — PROMETHAZINE HCL 25 MG PO TABS: 12.5000 mg | ORAL_TABLET | Freq: Three times a day (TID) | ORAL | 0 refills | Status: AC | PRN

## 2020-03-07 MED ORDER — KETOROLAC TROMETHAMINE 60 MG/2ML IM SOLN
60.0000 mg | Freq: Once | INTRAMUSCULAR | Status: AC
  Administered 2020-03-07: 60 mg via INTRAMUSCULAR

## 2020-03-07 MED ORDER — METOCLOPRAMIDE HCL 5 MG/ML IJ SOLN
INTRAMUSCULAR | Status: AC
  Filled 2020-03-07: qty 2

## 2020-03-07 MED ORDER — KETOROLAC TROMETHAMINE 60 MG/2ML IM SOLN
INTRAMUSCULAR | Status: AC
  Filled 2020-03-07: qty 2

## 2020-03-07 NOTE — Discharge Instructions (Signed)
May take phenergan as well to promote sleep and help with headache.  Go home, rest in quiet dark room. Limit screen time.  Drink plenty of water to ensure adequate hydration.   Follow up with your primary care provider as needed for recurrence or persistent symptoms.

## 2020-03-07 NOTE — ED Triage Notes (Signed)
Pt c/o HA, "migraine" for three days, refractory to her advil migraine for HA. Pt c/o pain originating to right side of face/eye, sinus/facial area and across frontal aspect to posterior neck area and photosensitivity. Also reports nausea, ear pressure.  Denies fever, chills, v/d, sore throat, runny nose, congestion, cough.

## 2020-03-07 NOTE — ED Provider Notes (Signed)
[place]    CSN: [id number] Arrival date & time: 03/07/20  7106      History   Chief Complaint Chief Complaint  Patient presents with  . Headache    HPI Gina Cameron is a 34 y.o. female.   Gina Cameron presents with complaints of migraine for the past 3 days. History of chronic/frequent migraines. Pain is more intense than usual for her. Movement worsens the pain as does sound and light. No vomiting, feels nauseated. No vision changes. No head injury. Takes excedrine migraine as needed, took it yesterday which didn't help. Takes amitriptyline nightly as well.    ROS per HPI, negative if not otherwise mentioned.      Past Medical History:  Diagnosis Date  . Anxiety   . Bipolar 2 disorder ([place])   . Constipation   . Depression   . GERD (gastroesophageal reflux disease)   . Hives   . Joint pain   . Leg edema   . Migraine   . PTSD (post-traumatic stress disorder)   . Reflux   . Seizures ([place])   . Sleep apnea   . 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 Seasonal and perennial allergic rhinitis 02/29/2020  . Mild persistent asthma, uncomplicated 26/94/8546  . Seasonal allergies 01/15/2020  . Pelvic pain 01/15/2020  . Abdominal bloating 01/15/2020  . Swelling 01/15/2020  . Bipolar depression ([place]) 10/17/2019  . Allergies 10/17/2019  . Class 3 severe obesity with serious comorbidity and body mass index (BMI) of 40.0 to 44.9 in adult [facility]) 10/17/2019  . Developmental delay 02/23/2018  . Seizures ([place]) 02/23/2018  . Hirsutism 02/23/2018  . Cyst of ovary 02/23/2018  . Complex tear of medial meniscus of right knee as current injury 03/01/2016  . 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 History of brain disorder 08/06/2014  . Migraine   . GERD 01/14/2010  . OBSTRUCTIVE SLEEP APNEA 10/30/2009  . Migraine without aura 06/10/2009  . ECZEMA 03/05/2009  . Depression with anxiety 07/24/2008  .  MILD MENTAL RETARDATION 01/15/2008  . 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 Congenital hamartoma ([place]) 01/15/2008  . MORBID OBESITY 01/10/2008  . Flexural atopic dermatitis 11/30/2006    Past Surgical History:  Procedure Laterality Date  . CHOLECYSTECTOMY    . OVARIAN CYST REMOVAL    . WISDOM TOOTH EXTRACTION      OB History    Gravida  0   Para  0   Term  0   Preterm  0   AB  0   Living  0     SAB  0   TAB  0   Ectopic  0   Multiple  0   Live Births  0            Home Medications    Prior to Admission medications   Medication Sig Start Date End Date Taking? Authorizing Provider  amitriptyline (ELAVIL) 10 MG tablet TK 1 T PO QHS 01/15/20  Yes Roma Schanz R, DO  cetirizine (ZYRTEC) 10 MG tablet Take 10 mg by mouth daily.   Yes [provider]  FLUoxetine (PROZAC) 20 MG capsule Take 1 capsule (20 mg total) by mouth daily. 01/15/20  Yes Roma Schanz R, DO  furosemide (LASIX) 40 MG tablet Take 1 tablet (40 mg total) by mouth daily. 10/02/19  Yes Roma Schanz R, DO  ibuprofen (ADVIL) 800 MG tablet Take 800  mg by mouth every 8 (eight) hours as needed.   Yes [provider]  levocetirizine (XYZAL) 5 MG tablet TAKE 2 TABLETS(10 MG) BY MOUTH EVERY MORNING 01/15/20  Yes Carollee Herter, Yvonne R, DO  montelukast (SINGULAIR) 10 MG tablet Take 1 tablet (10 mg total) by mouth at bedtime. 01/15/20  Yes Roma Schanz R, DO  omeprazole (PRILOSEC) 20 MG capsule TAKE 1 CAPSULE(20 MG) BY MOUTH DAILY 01/15/20  Yes Roma Schanz R, DO  levocetirizine (XYZAL) 5 MG tablet Take 1 tablet (5 mg total) by mouth in the morning and at bedtime. 02/28/20   Valentina Shaggy, MD  montelukast (SINGULAIR) 10 MG tablet Take 1 tablet (10 mg total) by mouth at bedtime. 02/28/20   Valentina Shaggy, MD  promethazine (PHENERGAN) 25 MG tablet Take 0.5-1 tablets (12.5-25 mg total) by mouth every 8 (eight) hours as needed for nausea or vomiting.  03/07/20   Zigmund Gottron, NP    Family History Family History  Problem Relation Age of Onset  . Hypertension Father   . Hyperlipidemia Father   . Depression Father   . Hypertension Mother   . Heart disease Mother   . Hypertension Maternal Grandmother   . Diabetes Maternal Grandmother   . Hyperlipidemia Maternal Grandmother     Social History Social History   Tobacco Use  . Smoking status: Former Smoker    Packs/day: 0.10    Years: 2.00    Pack years: 0.20    Types: Cigarettes    Quit date: 09/06/2012    Years since quitting: 7.5  . Smokeless tobacco: Never Used  Vaping Use  . Vaping Use: Never used  Substance Use Topics  . Alcohol use: Yes    Comment: Socially  . Drug use: Yes    Types: Marijuana     Allergies   Lidocaine-epinephrine, Mepivacaine hcl, Diphenhydramine hcl, Penicillins, and Latex   Review of Systems Review of Systems   Physical Exam Triage Vital Signs ED Triage Vitals  Enc Vitals Group     BP 03/07/20 1200 (!) 142/99     Pulse Rate 03/07/20 1200 91     Resp 03/07/20 1200 18     Temp 03/07/20 1200 98.8 F (37.1 C)     Temp Source 03/07/20 1200 Oral     SpO2 03/07/20 1200 98 %     Weight --      Height --      Head Circumference --      Peak Flow --      Pain Score 03/07/20 1157 10     Pain Loc --      Pain Edu? --      Excl. in [place]? --    No data found.  Updated Vital Signs BP (!) 142/99 (BP Location: Right Wrist)   Pulse 91   Temp 98.8 F (37.1 C) (Oral)   Resp 18   LMP 02/03/2020 (Approximate)   SpO2 98%   Visual Acuity Right Eye Distance:   Left Eye Distance:   Bilateral Distance:    Right Eye Near:   Left Eye Near:    Bilateral Near:     Physical Exam Constitutional:      General: She is not in acute distress.    Appearance: She is well-developed.     Comments: Sitting in dark exam room with wash cloth on forehead  HENT:     Mouth/Throat:     Mouth: Mucous membranes are moist.  Eyes:  Extraocular  Movements: Extraocular movements intact.     Pupils: Pupils are equal, round, and reactive to light.  Cardiovascular:     Rate and Rhythm: Normal rate.  Pulmonary:     Effort: Pulmonary effort is normal.  Musculoskeletal:     Cervical back: Normal range of motion.  Skin:    General: Skin is warm and dry.  Neurological:     Mental Status: She is alert and oriented to person, place, and time.      UC Treatments / Results  Labs (all labs ordered are listed, but only abnormal results are displayed) Labs Reviewed - No data to display  EKG   Radiology No results found.  Procedures Procedures (including critical care time)  Medications Ordered in UC Medications  ketorolac (TORADOL) injection 60 mg (60 mg Intramuscular Given 03/07/20 1247)  metoCLOPramide (REGLAN) injection 5 mg (5 mg Intramuscular Given 03/07/20 1248)  dexamethasone (DECADRON) injection 10 mg (10 mg Intramuscular Given 03/07/20 1248)    Initial Impression / Assessment and Plan / UC Course  I have reviewed the triage vital signs and the nursing notes.  Pertinent labs & imaging results that were available during my care of the patient were reviewed by me and considered in my medical decision making (see chart for details).     No red flag findings. Migraine. Pain management provided in clinic today and expected course of recovery discussed. Return precautions provided. Patient verbalized understanding and agreeable to plan.   Final Clinical Impressions(s) / UC Diagnoses   Final diagnoses:  Migraine without status migrainosus, not intractable, unspecified migraine type     Discharge Instructions     May take phenergan as well to promote sleep and help with headache.  Go home, rest in quiet dark room. Limit screen time.  Drink plenty of water to ensure adequate hydration.   Follow up with your primary care provider as needed for recurrence or persistent symptoms.     ED Prescriptions    Medication Sig  Dispense Auth. Provider   promethazine (PHENERGAN) 25 MG tablet Take 0.5-1 tablets (12.5-25 mg total) by mouth every 8 (eight) hours as needed for nausea or vomiting. 5 tablet Zigmund Gottron, NP     PDMP not reviewed this encounter.   Zigmund Gottron, NP 03/07/20 1719

## 2020-03-11 ENCOUNTER — Ambulatory Visit: Payer: Medicare Other | Admitting: Advanced Practice Midwife

## 2020-04-01 DIAGNOSIS — H903 Sensorineural hearing loss, bilateral: Secondary | ICD-10-CM | POA: Diagnosis not present

## 2020-04-01 DIAGNOSIS — H93A1 Pulsatile tinnitus, right ear: Secondary | ICD-10-CM | POA: Diagnosis not present

## 2020-04-01 DIAGNOSIS — H9313 Tinnitus, bilateral: Secondary | ICD-10-CM | POA: Diagnosis not present

## 2020-04-01 DIAGNOSIS — J302 Other seasonal allergic rhinitis: Secondary | ICD-10-CM | POA: Diagnosis not present

## 2020-04-01 DIAGNOSIS — M26609 Unspecified temporomandibular joint disorder, unspecified side: Secondary | ICD-10-CM | POA: Diagnosis not present

## 2020-04-01 DIAGNOSIS — R519 Headache, unspecified: Secondary | ICD-10-CM | POA: Diagnosis not present

## 2020-04-01 DIAGNOSIS — H9312 Tinnitus, left ear: Secondary | ICD-10-CM | POA: Diagnosis not present

## 2020-04-28 ENCOUNTER — Other Ambulatory Visit: Payer: Self-pay | Admitting: Otolaryngology

## 2020-04-29 ENCOUNTER — Other Ambulatory Visit: Payer: Self-pay | Admitting: Family Medicine

## 2020-04-29 DIAGNOSIS — K219 Gastro-esophageal reflux disease without esophagitis: Secondary | ICD-10-CM

## 2020-05-01 ENCOUNTER — Other Ambulatory Visit: Payer: Self-pay | Admitting: Otolaryngology

## 2020-05-01 DIAGNOSIS — H93A1 Pulsatile tinnitus, right ear: Secondary | ICD-10-CM

## 2020-05-04 ENCOUNTER — Ambulatory Visit (HOSPITAL_COMMUNITY)
Admission: EM | Admit: 2020-05-04 | Discharge: 2020-05-04 | Disposition: A | Discharge status: Home / Self Care | Payer: Medicare Other | Attending: Urgent Care | Admitting: Urgent Care

## 2020-05-04 ENCOUNTER — Encounter (HOSPITAL_COMMUNITY): Payer: Self-pay | Admitting: *Deleted

## 2020-05-04 DIAGNOSIS — R079 Chest pain, unspecified: Secondary | ICD-10-CM | POA: Diagnosis not present

## 2020-05-04 HISTORY — DX: Unspecified asthma, uncomplicated: J45.909

## 2020-05-04 NOTE — ED Triage Notes (Signed)
Pt states she received Moderna booster yesterday; woke this AM with constant substernal chest pain.  Pain worse with movement and various positions and deep breathing.  Also c/o feeling wheezing this evening.  Has been told she has asthma, but was told she does not need inhalers in past.

## 2020-05-04 NOTE — ED Notes (Signed)
EKG given to K. Hall Busing, NP.

## 2020-05-04 NOTE — ED Provider Notes (Signed)
[place]    CSN: [id number] Arrival date & time: 05/04/20  1716      History   Chief Complaint Chief Complaint  Patient presents with  . Chest Pain    HPI Gina Cameron is a 34 y.o. female.   Patient presents with chest pain since this morning.  The pain is worse with deep breaths and movement.  She also reports feeling short of breath and like she is wheezing.  She denies focal weakness, dizziness, headache, diaphoresis, nausea, fever, chills, or other symptoms.  No treatments attempted at home.  Her medical history includes morbid obesity, depression with anxiety, developmental delay, PTSD, bipolar 2 disorder, migraine without aura, seizures, atopic dermatitis, eczema, Sturge-Weber syndrome, idiopathic angioedema-urticaria, hirsutism, ovarian cyst, allergies, asthma, OSA.  The history is provided by the patient and medical records.    Past Medical History:  Diagnosis Date  . Anxiety   . Asthma   . Bipolar 2 disorder ([place])   . Constipation   . Depression   . GERD (gastroesophageal reflux disease)   . Hives   . Joint pain   . Leg edema   . Migraine   . PTSD (post-traumatic stress disorder)   . Reflux   . Seizures ([place])   . Sleep apnea   . 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 Seasonal and perennial allergic rhinitis 02/29/2020  . Mild persistent asthma, uncomplicated 58/52/7782  . Seasonal allergies 01/15/2020  . Pelvic pain 01/15/2020  . Abdominal bloating 01/15/2020  . Swelling 01/15/2020  . Bipolar depression ([place]) 10/17/2019  . Allergies 10/17/2019  . Class 3 severe obesity with serious comorbidity and body mass index (BMI) of 40.0 to 44.9 in adult [facility]) 10/17/2019  . Developmental delay 02/23/2018  . Seizures ([place]) 02/23/2018  . Hirsutism 02/23/2018  . Cyst of ovary 02/23/2018  . Complex tear of medial meniscus of right knee as current injury 03/01/2016  . Idiopathic angio-edema-urticaria  09/09/2015  . History of brain disorder 08/06/2014  . Migraine   . GERD 01/14/2010  . OBSTRUCTIVE SLEEP APNEA 10/30/2009  . Migraine without aura 06/10/2009  . ECZEMA 03/05/2009  . Depression with anxiety 07/24/2008  . MILD MENTAL RETARDATION 01/15/2008  . 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 MORBID OBESITY 01/10/2008  . Flexural atopic dermatitis 11/30/2006    Past Surgical History:  Procedure Laterality Date  . CHOLECYSTECTOMY    . OVARIAN CYST REMOVAL    . WISDOM TOOTH EXTRACTION      OB History    Gravida  0   Para  0   Term  0   Preterm  0   AB  0   Living  0     SAB  0   TAB  0   Ectopic  0   Multiple  0   Live Births  0            Home Medications    Prior to Admission medications   Medication Sig Start Date End Date Taking? Authorizing Provider  amitriptyline (ELAVIL) 10 MG tablet TK 1 T PO QHS 01/15/20  Yes Roma Schanz R, DO  cetirizine (ZYRTEC) 10 MG tablet Take 10 mg by mouth daily.   Yes [provider]  FLUoxetine (PROZAC) 20 MG capsule Take 1 capsule (20 mg total) by mouth daily. 01/15/20  Yes Roma Schanz R, DO  furosemide (LASIX) 40  MG tablet Take 1 tablet (40 mg total) by mouth daily. 10/02/19  Yes Roma Schanz R, DO  ibuprofen (ADVIL) 800 MG tablet Take 800 mg by mouth every 8 (eight) hours as needed.   Yes [provider]  levocetirizine (XYZAL) 5 MG tablet TAKE 2 TABLETS(10 MG) BY MOUTH EVERY MORNING 01/15/20  Yes Carollee Herter, Kendrick Fries R, DO  omeprazole (PRILOSEC) 20 MG capsule TAKE 1 CAPSULE BY MOUTH EVERY DAY 04/29/20  Yes Roma Schanz R, DO  levocetirizine (XYZAL) 5 MG tablet Take 1 tablet (5 mg total) by mouth in the morning and at bedtime. 02/28/20   Valentina Shaggy, MD  montelukast (SINGULAIR) 10 MG tablet Take 1 tablet (10 mg total) by mouth at bedtime. 01/15/20   Roma Schanz R, DO  montelukast (SINGULAIR) 10 MG tablet Take 1  tablet (10 mg total) by mouth at bedtime. 02/28/20   Valentina Shaggy, MD  promethazine (PHENERGAN) 25 MG tablet Take 0.5-1 tablets (12.5-25 mg total) by mouth every 8 (eight) hours as needed for nausea or vomiting. 03/07/20   Zigmund Gottron, NP    Family History Family History  Problem Relation Age of Onset  . Hypertension Father   . Hyperlipidemia Father   . Depression Father   . Hypertension Mother   . Heart disease Mother   . Hypertension Maternal Grandmother   . Diabetes Maternal Grandmother   . Hyperlipidemia Maternal Grandmother     Social History Social History   Tobacco Use  . Smoking status: Former Smoker    Packs/day: 0.10    Years: 2.00    Pack years: 0.20    Types: Cigarettes    Quit date: 09/06/2012    Years since quitting: 7.6  . Smokeless tobacco: Never Used  Vaping Use  . Vaping Use: Never used  Substance Use Topics  . Alcohol use: Yes    Comment: Socially  . Drug use: Yes    Types: Marijuana     Allergies   Lidocaine-epinephrine, Mepivacaine hcl, Diphenhydramine hcl, Molds & smuts, Other, Penicillins, and Latex   Review of Systems Review of Systems  Constitutional: Negative for chills and fever.  HENT: Negative for ear pain and sore throat.   Eyes: Negative for pain and visual disturbance.  Respiratory: Positive for shortness of breath and wheezing. Negative for cough.   Cardiovascular: Positive for chest pain. Negative for palpitations.  Gastrointestinal: Negative for abdominal pain and vomiting.  Genitourinary: Negative for dysuria and hematuria.  Musculoskeletal: Negative for arthralgias and back pain.  Skin: Negative for color change and rash.  Neurological: Negative for dizziness, tremors, seizures, syncope, facial asymmetry, speech difficulty, weakness, light-headedness, numbness and headaches.  All other systems reviewed and are negative.    Physical Exam Triage Vital Signs ED Triage Vitals  Enc Vitals Group     BP      Pulse       Resp      Temp      Temp src      SpO2      Weight      Height      Head Circumference      Peak Flow      Pain Score      Pain Loc      Pain Edu?      Excl. in [place]?    No data found.  Updated Vital Signs BP (!) 127/58   Pulse 100   Temp 98.5 F (36.9 C) (Oral)  Resp (S) (!) 28 Comment: "if I take shorter quicker breaths it doesn't hurt as bad"  SpO2 97%   Visual Acuity Right Eye Distance:   Left Eye Distance:   Bilateral Distance:    Right Eye Near:   Left Eye Near:    Bilateral Near:     Physical Exam Vitals and nursing note reviewed.  Constitutional:      General: She is not in acute distress.    Appearance: She is well-developed. She is obese. She is not ill-appearing.  HENT:     Head: Normocephalic and atraumatic.     Mouth/Throat:     Mouth: Mucous membranes are moist.  Eyes:     Conjunctiva/sclera: Conjunctivae normal.  Cardiovascular:     Rate and Rhythm: Normal rate and regular rhythm.     Heart sounds: Normal heart sounds.  Pulmonary:     Effort: Pulmonary effort is normal. No respiratory distress.     Breath sounds: Normal breath sounds. No wheezing or rhonchi.  Abdominal:     Palpations: Abdomen is soft.     Tenderness: There is no abdominal tenderness. There is no guarding or rebound.  Musculoskeletal:     Cervical back: Neck supple.  Skin:    General: Skin is warm and dry.     Findings: No rash.  Neurological:     General: No focal deficit present.     Mental Status: She is alert and oriented to person, place, and time.     Gait: Gait normal.  Psychiatric:        Mood and Affect: Mood normal.        Behavior: Behavior normal.      UC Treatments / Results  Labs (all labs ordered are listed, but only abnormal results are displayed) Labs Reviewed - No data to display  EKG   Radiology No results found.  Procedures Procedures (including critical care time)  Medications Ordered in UC Medications - No data to  display  Initial Impression / Assessment and Plan / UC Course  I have reviewed the triage vital signs and the nursing notes.  Pertinent labs & imaging results that were available during my care of the patient were reviewed by me and considered in my medical decision making (see chart for details).   Chest pain.  EKG shows sinus rhythm, rate 95, no ST elevation, compared to previous from 2019.  Discussed limitations of evaluation of chest pain in an urgent care.  Patient declines transfer to the ED at this time.  Instructed patient to go to the ED if she has acute worsening symptoms.  Instructed her to take Tylenol or ibuprofen as needed for her discomfort.  She agrees to plan of care.   Final Clinical Impressions(s) / UC Diagnoses   Final diagnoses:  Chest pain, unspecified type     Discharge Instructions     Go to the emergency department if you have chest pain or other concerning symptoms.        ED Prescriptions    None     PDMP not reviewed this encounter.   Sharion Balloon, NP 05/04/20 1820

## 2020-05-04 NOTE — Discharge Instructions (Signed)
Go to the emergency department if you have chest pain or other concerning symptoms.

## 2020-05-06 ENCOUNTER — Telehealth: Payer: Medicare Other | Admitting: Family Medicine

## 2020-05-06 ENCOUNTER — Other Ambulatory Visit: Payer: Self-pay | Admitting: Family Medicine

## 2020-05-06 ENCOUNTER — Other Ambulatory Visit: Payer: Self-pay

## 2020-05-29 ENCOUNTER — Encounter: Payer: Self-pay | Admitting: Allergy & Immunology

## 2020-05-29 ENCOUNTER — Ambulatory Visit (INDEPENDENT_AMBULATORY_CARE_PROVIDER_SITE_OTHER): Payer: Medicare Other | Admitting: Allergy & Immunology

## 2020-05-29 ENCOUNTER — Other Ambulatory Visit: Payer: Self-pay

## 2020-05-29 VITALS — BP 128/86 | HR 87 | Temp 98.3°F | Resp 14 | Ht 62.0 in | Wt 259.4 lb

## 2020-05-29 DIAGNOSIS — J302 Other seasonal allergic rhinitis: Secondary | ICD-10-CM | POA: Diagnosis not present

## 2020-05-29 DIAGNOSIS — J4530 Mild persistent asthma, uncomplicated: Secondary | ICD-10-CM | POA: Diagnosis not present

## 2020-05-29 DIAGNOSIS — J3089 Other allergic rhinitis: Secondary | ICD-10-CM | POA: Diagnosis not present

## 2020-05-29 DIAGNOSIS — L2089 Other atopic dermatitis: Secondary | ICD-10-CM

## 2020-05-29 MED ORDER — ZYRTEC ALLERGY 10 MG PO CAPS: 10.0000 mg | ORAL_CAPSULE | Freq: Two times a day (BID) | ORAL | 5 refills | Status: AC

## 2020-05-29 MED ORDER — EPINEPHRINE 0.3 MG/0.3ML IJ SOAJ: 0.3000 mg | Freq: Once | INTRAMUSCULAR | 1 refills | Status: AC

## 2020-05-29 MED ORDER — VENTOLIN HFA 108 (90 BASE) MCG/ACT IN AERS: 2.0000 | INHALATION_SPRAY | Freq: Four times a day (QID) | RESPIRATORY_TRACT | 1 refills | Status: DC | PRN

## 2020-05-29 NOTE — Patient Instructions (Addendum)
1. Mild persistent asthma, uncomplicated - Lung testing looks great today. - We are sending you in albuterol for you to have.  - Try using albuterol BEFORE you exercise to see if this helps. - Spacer sample and demonstration provided. - Daily controller medication(s): Singulair 10 mg daily - Prior to physical activity: albuterol 2 puffs 10-15 minutes before physical activity. - Rescue medications: albuterol 4 puffs every 4-6 hours as needed - Asthma control goals:  * Full participation in all desired activities (may need albuterol before activity) * Albuterol use two time or less a week on average (not counting use with activity) * Cough interfering with sleep two time or less a month * Oral steroids no more than once a year * No hospitalizations  2. Chronic rhinitis (grasses, weeds, trees, indoor molds, outdoor molds, dust mites, cat and cockroach) - We are going to change your medications around slightly.  - Stop taking: Xyzal (levocetirizine) - Continue with: Singulair (montelukast) 10mg  daily - Start taking: Zyrtec (cetirizine) 10mg  TWICE DAILY - You can use an extra dose of the antihistamine, if needed, for breakthrough symptoms.  - Come back at the end of the month to start allergy shots.   3. Flexural atopic dermatitis - Skin looks great today. - We are not going to add anything today.  4. Return in about 6 months (around 11/27/2020).    Please inform us of any Emergency Department visits, hospitalizations, or changes in symptoms. Call us before going to the ED for breathing or allergy symptoms since we might be able to fit you in for a sick visit. Feel free to contact us anytime with any questions, problems, or concerns.  It was a pleasure to see you again today!  Websites that have reliable patient information: 1. American Academy of Asthma, Allergy, and Immunology: www.aaaai.org 2. Food Allergy Research and Education (FARE): foodallergy.org 3. Mothers of Asthmatics:  http://www.asthmacommunitynetwork.org 4. American College of Allergy, Asthma, and Immunology: www.acaai.org   COVID-19 Vaccine Information can be found at: ShippingScam.co.uk For questions related to vaccine distribution or appointments, please email vaccine@[email or web address] [email or web address] or call [phone].     "Like" [place] on Facebook and Instagram for our latest updates!     HAPPY FALL!     Make sure you are registered to vote! If you have moved or changed any of your contact information, you will need to get this updated before voting!  In some cases, you MAY be able to register to vote online: [email or web address]

## 2020-05-29 NOTE — Progress Notes (Signed)
FOLLOW UP  Date of Service/Encounter:  05/29/20   Assessment:   Mild persistent asthma, uncomplicated  Seasonal and perennial allergic rhinitis (grasses, weeds, trees, indoor molds, outdoor molds, dust mites, cat and cockroach)  Flexural atopic dermatitis  Disabled status due to seizure disorder   Plan/Recommendations:   1. Mild persistent asthma, uncomplicated - Lung testing looks great today. - We are sending you in albuterol for you to have.  - Try using albuterol BEFORE you exercise to see if this helps. - Spacer sample and demonstration provided. - Daily controller medication(s): Singulair 10 mg daily - Prior to physical activity: albuterol 2 puffs 10-15 minutes before physical activity. - Rescue medications: albuterol 4 puffs every 4-6 hours as needed - Asthma control goals:  * Full participation in all desired activities (may need albuterol before activity) * Albuterol use two time or less a week on average (not counting use with activity) * Cough interfering with sleep two time or less a month * Oral steroids no more than once a year * No hospitalizations  2. Chronic rhinitis (grasses, weeds, trees, indoor molds, outdoor molds, dust mites, cat and cockroach) - We are going to change your medications around slightly.  - Stop taking: Xyzal (levocetirizine) - Continue with: Singulair (montelukast) 10mg  daily - Start taking: Zyrtec (cetirizine) 10mg  TWICE DAILY - You can use an extra dose of the antihistamine, if needed, for breakthrough symptoms.  - Come back at the end of the month to start allergy shots.   3. Flexural atopic dermatitis - Skin looks great today. - We are not going to add anything today.  4. Return in about 6 months (around 11/27/2020).   Subjective:   Gina Cameron is a 34 y.o. female presenting today for follow up of  Chief Complaint  Patient presents with  . Asthma    She says it is fine right now. Acts up every so often if doing  heavy lifting or working out.    Gina Cameron has a history of the following: Patient Active Problem List   Diagnosis Date Noted  . Seasonal and perennial allergic rhinitis 02/29/2020  . Mild persistent asthma, uncomplicated 78/46/9629  . Seasonal allergies 01/15/2020  . Pelvic pain 01/15/2020  . Abdominal bloating 01/15/2020  . Swelling 01/15/2020  . Bipolar depression ([place]) 10/17/2019  . Allergies 10/17/2019  . Class 3 severe obesity with serious comorbidity and body mass index (BMI) of 40.0 to 44.9 in adult [facility]) 10/17/2019  . Developmental delay 02/23/2018  . Seizures ([place]) 02/23/2018  . Hirsutism 02/23/2018  . Cyst of ovary 02/23/2018  . Complex tear of medial meniscus of right knee as current injury 03/01/2016  . 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 History of brain disorder 08/06/2014  . Migraine   . GERD 01/14/2010  . OBSTRUCTIVE SLEEP APNEA 10/30/2009  . Migraine without aura 06/10/2009  . ECZEMA 03/05/2009  . Depression with anxiety 07/24/2008  . MILD MENTAL RETARDATION 01/15/2008  . 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 Congenital hamartoma ([place]) 01/15/2008  . MORBID OBESITY 01/10/2008  . Flexural atopic dermatitis 11/30/2006    History obtained from: chart review and patient.  Gina Cameron is a 34 y.o. female presenting for a follow up visit.  I last saw her in September 2021 as a new patient.  At that time, her lung testing looked great.  We continued her on Singulair 10 mg daily as well as albuterol as needed.  She had environmental allergy testing that was positive to grasses,  weeds, trees, indoor and outdoor molds, dust mite, cat, and cockroach.  We stopped her Zyrtec and continued Xyzal but increase this to twice daily and continued with Singulair.  We did discuss allergen immunotherapy.  Her atopic dermatitis was under good control.  Since last visit, she has done fairly well.  Asthma/Respiratory Symptom History: She only has issues when she is  exercising or when she is around people smoking. She uses the allergy medications which seem to help. She has tried doubling up on the montelukast, as she thinks that I recommended that. She does not use albuterol prior to physical activity. She is sleeping well. Sometimes it is hard to wake up.   Allergic Rhinitis Symptom History: She reports that she gets congestion. She gets nose bleeds all of the time. This is why she does not use the nasal sprays because she gets bad nosebleeds. She is on the montelukast and the levocetirizine two tablets daily. She will have to use the Zyrtec too.  She is interested in starting allergen immunotherapy.  In fact, she showed up yesterday for her appointment today.  When she figured out the mistake, she actually asked questions about allergy shots and signed consent.  She made an appointment to start at the end of the month.  She is on no seizure medications.  Her last seizure was in 2009.  They never figured out why she was having seizures, but over the years she has been weaned off of all of her seizure medications.   Otherwise, there have been no changes to her past medical history, surgical history, family history, or social history.    Review of Systems  Constitutional: Negative.  Negative for chills, fever, malaise/fatigue and weight loss.  HENT: Positive for congestion and sinus pain. Negative for ear discharge and ear pain.   Eyes: Negative for pain, discharge and redness.  Respiratory: Negative for cough, sputum production, shortness of breath and wheezing.   Cardiovascular: Negative.  Negative for chest pain and palpitations.  Gastrointestinal: Negative for abdominal pain, constipation, diarrhea, heartburn, nausea and vomiting.  Skin: Negative.  Negative for itching and rash.  Neurological: Negative for dizziness and headaches.  Endo/Heme/Allergies: Positive for environmental allergies. Does not bruise/bleed easily.       Objective:   Blood  pressure 128/86, pulse 87, temperature 98.3 F (36.8 C), resp. rate 14, height 5\' 2"  (1.575 m), weight 259 lb 6.4 oz (117.7 kg), SpO2 98 %. Body mass index is 47.44 kg/m.   Physical Exam:  Physical Exam Constitutional:      Appearance: She is well-developed.  HENT:     Head: Normocephalic and atraumatic.     Right Ear: Tympanic membrane, ear canal and external ear normal.     Left Ear: Tympanic membrane and ear canal normal.     Nose: No nasal deformity, septal deviation, mucosal edema, rhinorrhea or epistaxis.     Right Turbinates: Enlarged and swollen.     Left Turbinates: Enlarged and swollen.     Right Sinus: No maxillary sinus tenderness or frontal sinus tenderness.     Left Sinus: No maxillary sinus tenderness or frontal sinus tenderness.     Mouth/Throat:     Mouth: Oropharynx is clear and moist. Mucous membranes are not pale and not dry.     Pharynx: Uvula midline.  Eyes:     General:        Right eye: No discharge.        Left eye: No discharge.  Extraocular Movements: EOM normal.     Conjunctiva/sclera: Conjunctivae normal.     Right eye: Right conjunctiva is not injected. No chemosis.    Left eye: Left conjunctiva is not injected. No chemosis.    Pupils: Pupils are equal, round, and reactive to light.  Cardiovascular:     Rate and Rhythm: Normal rate and regular rhythm.     Heart sounds: Normal heart sounds.  Pulmonary:     Effort: Pulmonary effort is normal. No tachypnea, accessory muscle usage or respiratory distress.     Breath sounds: Normal breath sounds. No wheezing, rhonchi or rales.     Comments: Moving air well in all lung fields. No increased work of breathing noted.  Chest:     Chest wall: No tenderness.  Lymphadenopathy:     Cervical: No cervical adenopathy.  Skin:    Coloration: Skin is not pale.     Findings: No abrasion, erythema, petechiae or rash. Rash is not papular, urticarial or vesicular.  Neurological:     Mental Status: She is alert.   Psychiatric:        Mood and Affect: Mood and affect normal.      Diagnostic studies:    Spirometry: results normal (FEV1: 2.78/112%, FVC: 3.20/109%, FEV1/FVC: 87%).    Spirometry consistent with normal pattern.   Allergy Studies: none      Salvatore Marvel, MD  [facility]

## 2020-05-31 ENCOUNTER — Ambulatory Visit
Admission: RE | Admit: 2020-05-31 | Discharge: 2020-05-31 | Disposition: A | Discharge status: Home / Self Care | Payer: Medicare Other | Source: Ambulatory Visit | Attending: Otolaryngology | Admitting: Otolaryngology

## 2020-05-31 ENCOUNTER — Other Ambulatory Visit: Payer: Self-pay

## 2020-05-31 DIAGNOSIS — Q858 Other phakomatoses, not elsewhere classified: Secondary | ICD-10-CM | POA: Diagnosis not present

## 2020-05-31 DIAGNOSIS — H93A1 Pulsatile tinnitus, right ear: Secondary | ICD-10-CM

## 2020-05-31 MED ORDER — GADOBENATE DIMEGLUMINE 529 MG/ML IV SOLN
20.0000 mL | Freq: Once | INTRAVENOUS | Status: AC | PRN
  Administered 2020-05-31: 20 mL via INTRAVENOUS

## 2020-06-02 NOTE — Progress Notes (Signed)
VIALS EXP 06-02-21

## 2020-06-04 DIAGNOSIS — J3081 Allergic rhinitis due to animal (cat) (dog) hair and dander: Secondary | ICD-10-CM | POA: Diagnosis not present

## 2020-06-04 NOTE — Progress Notes (Signed)
VIALS CORRECTED.  EXP 06-02-21

## 2020-06-05 DIAGNOSIS — J3089 Other allergic rhinitis: Secondary | ICD-10-CM | POA: Diagnosis not present

## 2020-06-06 ENCOUNTER — Other Ambulatory Visit: Payer: Self-pay

## 2020-06-06 ENCOUNTER — Ambulatory Visit (HOSPITAL_COMMUNITY)
Admission: EM | Admit: 2020-06-06 | Discharge: 2020-06-06 | Disposition: A | Discharge status: Home / Self Care | Payer: Medicare Other | Attending: Family Medicine | Admitting: Family Medicine

## 2020-06-06 DIAGNOSIS — Z3202 Encounter for pregnancy test, result negative: Secondary | ICD-10-CM | POA: Diagnosis not present

## 2020-06-06 DIAGNOSIS — Z113 Encounter for screening for infections with a predominantly sexual mode of transmission: Secondary | ICD-10-CM | POA: Insufficient documentation

## 2020-06-06 DIAGNOSIS — N898 Other specified noninflammatory disorders of vagina: Secondary | ICD-10-CM | POA: Diagnosis not present

## 2020-06-06 DIAGNOSIS — R109 Unspecified abdominal pain: Secondary | ICD-10-CM | POA: Diagnosis not present

## 2020-06-06 LAB — POCT URINALYSIS DIPSTICK, ED / UC
Bilirubin Urine: NEGATIVE
Glucose, UA: NEGATIVE mg/dL
Hgb urine dipstick: NEGATIVE
Ketones, ur: NEGATIVE mg/dL
Nitrite: NEGATIVE
Protein, ur: NEGATIVE mg/dL
Specific Gravity, Urine: >=1.03 (ref 1.005–1.030)
Urobilinogen, UA: 0.2 mg/dL (ref 0.0–1.0)
pH: 5.5 (ref 5.0–8.0)

## 2020-06-06 LAB — POC URINE PREG, ED: Preg Test, Ur: NEGATIVE

## 2020-06-06 NOTE — ED Triage Notes (Signed)
PT reports lower abdominal cramping. Menstrual started 05/27/20, she is still having bleeding which is abnormal for her. Reports bleeding is moderate.   Reports vaginal discharge which is brown/ red in color. Urine is "red" as well. No dysuria.

## 2020-06-06 NOTE — ED Provider Notes (Signed)
[facility]    CSN: [id number] Arrival date & time: 06/06/20  1844      History   Chief Complaint Chief Complaint  Patient presents with   Abdominal Pain   Vaginal Discharge    HPI Gina Cameron is a 34 y.o. female.   Patient presenting today with vaginal discharge and blood clots with some lower abdominal cramping worst on the right. Just came off her period a few days prior. Denies fever, chills, dysuria, N/V/D. Has IUD in place for hx of severe menstrual cramps and hx of ovarian cysts. Wanting STI testing today additionally. Has not tried taking anything for sxs.      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[place])     Patient Active Problem List   Diagnosis Date Noted   Seasonal and perennial allergic rhinitis 02/29/2020   Mild persistent asthma, uncomplicated 17/40/8144   Seasonal allergies 01/15/2020   Pelvic pain 01/15/2020   Abdominal bloating 01/15/2020   Swelling 01/15/2020   Bipolar depression ([place]) 10/17/2019   Allergies 10/17/2019   Class 3 severe obesity with serious comorbidity and body mass index (BMI) of 40.0 to 44.9 in adult ([place]) 10/17/2019   Developmental delay 02/23/2018   Seizures ([place]) 02/23/2018   Hirsutism 02/23/2018   Cyst of ovary 02/23/2018   Complex tear of medial meniscus of right knee as current injury 03/01/2016   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 Authorizing Provider  Cetirizine HCl (ZYRTEC ALLERGY) 10 MG CAPS Take 1 capsule (10 mg total) by mouth in the morning and at bedtime. 05/29/20  Yes Valentina Shaggy, MD  FLUoxetine (PROZAC) 20 MG capsule Take 1 capsule (20 mg total) by mouth daily. 01/15/20  Yes Roma Schanz R, DO  levocetirizine (XYZAL) 5 MG tablet Take 1 tablet (5 mg total) by mouth in the morning and at bedtime. 02/28/20  Yes Valentina Shaggy, MD  omeprazole (PRILOSEC) 20 MG capsule TAKE 1 CAPSULE BY MOUTH EVERY DAY 04/29/20  Yes Carollee Herter, Kendrick Fries R, DO  amitriptyline (ELAVIL) 10 MG tablet TK 1 T PO QHS 01/15/20   Carollee Herter, Alferd Apa, DO  cetirizine (ZYRTEC) 10 MG tablet Take 10 mg by mouth daily.    [provider]  furosemide (LASIX) 40 MG tablet Take 1 tablet (40 mg total) by mouth daily. 10/02/19   Ann Held, DO  ibuprofen (ADVIL) 800 MG tablet Take 800 mg by mouth every 8 (eight) hours as needed.    [provider]  levocetirizine (XYZAL) 5 MG tablet TAKE 2 TABLETS(10 MG) BY MOUTH EVERY MORNING 01/15/20   Carollee Herter, Alferd Apa, DO  levocetirizine (XYZAL) 5 MG tablet TAKE 2 TABLETS BY MOUTH EVERY MORNING 05/06/20 06/05/20  Valentina Shaggy, MD  montelukast (SINGULAIR) 10 MG tablet Take 1 tablet (10 mg total) by mouth at bedtime. 01/15/20   Roma Schanz R, DO  montelukast (SINGULAIR) 10 MG tablet Take 1 tablet  (10 mg total) by mouth at bedtime. 02/28/20   Valentina Shaggy, MD  montelukast (SINGULAIR) 10 MG tablet Take 1 tablet (10 mg total) by mouth daily. 05/06/20 06/05/20  Valentina Shaggy, MD  promethazine (PHENERGAN) 25 MG tablet Take 0.5-1 tablets (12.5-25 mg total) by mouth every 8 (eight) hours as needed for nausea or vomiting. 03/07/20   Zigmund Gottron, NP  VENTOLIN HFA 108 (90 Base) MCG/ACT inhaler Inhale 2 puffs into the lungs every 6 (six) hours as needed for wheezing or shortness of breath. 05/29/20   Valentina Shaggy, MD    Family History Family History  Problem Relation Age of Onset   Hypertension Father    Hyperlipidemia Father    Depression Father    Hypertension Mother    Heart disease Mother    Hypertension Maternal Grandmother    Diabetes Maternal Grandmother    Hyperlipidemia Maternal Grandmother     Social History Social History   Tobacco Use   Smoking status: Former Smoker    Packs/day: 0.10    Years: 2.00    Pack years: 0.20    Types: Cigarettes    Quit date: 09/06/2012    Years since quitting: 7.7   Smokeless tobacco: Never Used  Vaping Use   Vaping Use: Never used  Substance Use Topics   Alcohol use: Yes    Comment: Socially   Drug use: Yes    Types: Marijuana     Allergies   Lidocaine-epinephrine, Mepivacaine hcl, Diphenhydramine hcl, Molds & smuts, Other, Penicillins, and Latex   Review of Systems Review of Systems PER HPI    Physical Exam Triage Vital Signs ED Triage Vitals  Enc Vitals Group     BP 06/06/20 1912 (!) 146/94     Pulse Rate 06/06/20 1912 85     Resp 06/06/20 1912 16     Temp 06/06/20 1912 98.2 F (36.8 C)     Temp Source 06/06/20 1912 Oral     SpO2 06/06/20 1912 100 %     Weight --      Height --      Head Circumference --      Peak Flow --      Pain Score 06/06/20 1909 6     Pain Loc --      Pain Edu? --      Excl. in [place]? --    No data found.  Updated Vital Signs BP (!) 146/94     Pulse 85    Temp 98.2 F (36.8  C) (Oral)    Resp 16    LMP 05/27/2020    SpO2 100%   Visual Acuity Right Eye Distance:   Left Eye Distance:   Bilateral Distance:    Right Eye Near:   Left Eye Near:    Bilateral Near:     Physical Exam Vitals and nursing note reviewed.  Constitutional:      Appearance: Normal appearance. She is not ill-appearing.  HENT:     Head: Atraumatic.     Mouth/Throat:     Mouth: Mucous membranes are moist.     Pharynx: Oropharynx is clear. No posterior oropharyngeal erythema.  Eyes:     Extraocular Movements: Extraocular movements intact.     Conjunctiva/sclera: Conjunctivae normal.  Cardiovascular:     Rate and Rhythm: Normal rate and regular rhythm.     Heart sounds: Normal heart sounds.  Pulmonary:     Effort: Pulmonary effort is normal.     Breath sounds: Normal breath sounds.  Abdominal:     General: Bowel sounds are normal. There is no distension.     Palpations: Abdomen is soft.     Tenderness: There is no abdominal tenderness. There is no guarding.  Genitourinary:    Comments: GU exam deferred to GYN Musculoskeletal:        General: Normal range of motion.     Cervical back: Normal range of motion and neck supple.  Skin:    General: Skin is warm and dry.  Neurological:     Mental Status: She is alert and oriented to person, place, and time.  Psychiatric:        Mood and Affect: Mood normal.        Thought Content: Thought content normal.        Judgment: Judgment normal.      UC Treatments / Results  Labs (all labs ordered are listed, but only abnormal results are displayed) Labs Reviewed  POCT URINALYSIS DIPSTICK, ED / UC - Abnormal; Notable for the following components:      Result Value   Leukocytes,Ua TRACE (*)    All other components within normal limits  POC URINE PREG, ED  CERVICOVAGINAL ANCILLARY ONLY    EKG   Radiology No results found.  Procedures Procedures (including critical care time)  Medications  Ordered in UC Medications - No data to display  Initial Impression / Assessment and Plan / UC Course  I have reviewed the triage vital signs and the nursing notes.  Pertinent labs & imaging results that were available during my care of the patient were reviewed by me and considered in my medical decision making (see chart for details).     U/A, urine preg negative today, aptima swab pending, exam and vital signs overall stable. Discussed awaiting results of swab and treating if needed, and f/u with GYN for IUD placement check and further imaging/eval if sxs do not improve. Return precautions given.   Final Clinical Impressions(s) / UC Diagnoses   Final diagnoses:  Vaginal discharge  Routine screening for STI (sexually transmitted infection)   Discharge Instructions   None    ED Prescriptions    None     PDMP not reviewed this encounter.   Merrie Roof [place], [place] 06/07/20 [phone]

## 2020-06-09 ENCOUNTER — Telehealth (HOSPITAL_COMMUNITY): Payer: Self-pay | Admitting: Emergency Medicine

## 2020-06-09 LAB — CERVICOVAGINAL ANCILLARY ONLY
Bacterial Vaginitis (gardnerella): POSITIVE — AB
Candida Glabrata: NEGATIVE
Candida Vaginitis: NEGATIVE
Chlamydia: NEGATIVE
Comment: NEGATIVE
Comment: NEGATIVE
Comment: NEGATIVE
Comment: NEGATIVE
Comment: NEGATIVE
Comment: NORMAL
Neisseria Gonorrhea: NEGATIVE
Trichomonas: POSITIVE — AB

## 2020-06-09 MED ORDER — METRONIDAZOLE 500 MG PO TABS: 500.0000 mg | ORAL_TABLET | Freq: Two times a day (BID) | ORAL | 0 refills | Status: DC

## 2020-06-10 NOTE — Progress Notes (Signed)
Date: 05/29/2020 Department: [facility] Ordering/Authorizing: Valentina Shaggy, MD    Associated Diagnoses  Seasonal and perennial allergic rhinitis [J30.89, J30.2]      Comments  Aeroallergen Immunotherapy    Patient Details  Name: Gina Cameron  MRN: [id number]  Date of Birth: 11/19/1985   Order 2 of 2   Vial Label: Molds/CR/DM   0.2 ml (Volume) 1:20 Concentration -- Alternaria alternata  0.2 ml (Volume) 1:20 Concentration -- Cladosporium herbarum  0.2 ml (Volume) 1:10 Concentration -- Aspergillus mix  0.2 ml (Volume) 1:10 Concentration -- Penicillium mix  0.2 ml (Volume) 1:10 Concentration -- Fusarium moniliforme  0.2 ml (Volume) 1:40 Concentration -- Aureobasidium pullulans  0.2 ml (Volume) 1:10 Concentration -- Rhizopus oryzae  0.3 ml (Volume) 1:20 Concentration -- Cockroach, [place]  0.5 ml (Volume)  AU Concentration -- Mite Mix (DF 5,000 & DP 5,000)    2.2 ml Extract Subtotal  2.8 ml Diluent  5.0 ml Maintenance Total    Final Concentration above is stated in weight/volume (wt/vol). Allergen units (AU/ml) biological units (BAU/ml). The total volume is 5 ml.    Schedule: A   Special Instructions: none

## 2020-06-10 NOTE — Progress Notes (Signed)
Aeroallergen Immunotherapy    Patient Details  Name: JAHNIYAH REVERE  MRN: [id number]  Date of Birth: 02-19-1986   Order 1 of 2   Vial Label: G/W/T/C   0.3 ml (Volume) BAU Concentration -- 7 Grass Mix* 100,000 ([address] [place], [place], [place], Perennial [place], [place], Sweet [place], [place])  0.2 ml (Volume) 1:20 Concentration -- [place]  0.3 ml (Volume) BAU Concentration -- [place] 10,000  0.2 ml (Volume) 1:20 Concentration -- [place]  0.5 ml (Volume) 1:20 Concentration -- Weed Mix*  0.5 ml (Volume) 1:20 Concentration -- Eastern 10 Tree Mix (also Sweet Gum)  0.5 ml (Volume) 1:10 Concentration -- Cat Hair    2.5 ml Extract Subtotal  2.5 ml Diluent  5.0 ml Maintenance Total    Final Concentration above is stated in weight/volume (wt/vol). Allergen units (AU/ml) biological units (BAU/ml). The total volume is 5 ml.    Schedule: A

## 2020-06-18 ENCOUNTER — Ambulatory Visit (INDEPENDENT_AMBULATORY_CARE_PROVIDER_SITE_OTHER): Payer: Medicare Other

## 2020-06-18 DIAGNOSIS — J309 Allergic rhinitis, unspecified: Secondary | ICD-10-CM | POA: Diagnosis not present

## 2020-06-18 NOTE — Progress Notes (Signed)
Immunotherapy   Patient Details  Name: CASY TAVANO MRN: [id number] Date of Birth: 1985/08/22  06/18/2020  [facility] pt here to start allergy injections on blue vial Following schedule: a  Frequency:weekly Epi-Pen:yes Consent signed and patient instructions given.   Berna Bue 06/18/2020, 9:58 AM

## 2020-07-03 ENCOUNTER — Ambulatory Visit (INDEPENDENT_AMBULATORY_CARE_PROVIDER_SITE_OTHER): Payer: Medicare Other | Admitting: *Deleted

## 2020-07-03 DIAGNOSIS — J309 Allergic rhinitis, unspecified: Secondary | ICD-10-CM | POA: Diagnosis not present

## 2020-07-14 ENCOUNTER — Ambulatory Visit (INDEPENDENT_AMBULATORY_CARE_PROVIDER_SITE_OTHER): Payer: Medicare Other | Admitting: Family Medicine

## 2020-07-14 ENCOUNTER — Encounter: Payer: Self-pay | Admitting: Family Medicine

## 2020-07-14 ENCOUNTER — Other Ambulatory Visit: Payer: Self-pay

## 2020-07-14 VITALS — BP 126/88 | HR 87 | Temp 97.8°F | Resp 12 | Ht 61.0 in | Wt 263.0 lb

## 2020-07-14 DIAGNOSIS — Z6841 Body Mass Index (BMI) 40.0 and over, adult: Secondary | ICD-10-CM

## 2020-07-14 DIAGNOSIS — K760 Fatty (change of) liver, not elsewhere classified: Secondary | ICD-10-CM

## 2020-07-14 DIAGNOSIS — F320 Major depressive disorder, single episode, mild: Secondary | ICD-10-CM

## 2020-07-14 LAB — COMPREHENSIVE METABOLIC PANEL
ALT: 52 U/L — ABNORMAL HIGH (ref 0–35)
AST: 31 U/L (ref 0–37)
Albumin: 3.7 g/dL (ref 3.5–5.2)
Alkaline Phosphatase: 76 U/L (ref 39–117)
BUN: 9 mg/dL (ref 6–23)
CO2: 28 mEq/L (ref 19–32)
Calcium: 9.2 mg/dL (ref 8.4–10.5)
Chloride: 106 mEq/L (ref 96–112)
Creatinine, Ser: 0.67 mg/dL (ref 0.40–1.20)
GFR: 115.72 mL/min (ref >60.00)
Glucose, Bld: 94 mg/dL (ref 70–99)
Potassium: 4 mEq/L (ref 3.5–5.1)
Sodium: 140 mEq/L (ref 135–145)
Total Bilirubin: 0.3 mg/dL (ref 0.2–1.2)
Total Protein: 6.6 g/dL (ref 6.0–8.3)

## 2020-07-14 LAB — GAMMA GT: GGT: 34 U/L (ref 7–51)

## 2020-07-14 NOTE — Progress Notes (Addendum)
Patient ID: Gina Cameron, female    DOB: 10-05-1985  Age: 35 y.o. MRN: [id number]    Subjective:  Subjective  HPI Gina Cameron presents for f/u fatty liver ----he was trying to lose weight but her dad says her portions are huge and she only usually eats 1x a day.  She also snacks on the wrong things.  She tried healthy weight and wellness but missed a few appointments and would have to start over  Her father (who is on the phone) also states she has been struggling with depression which may be contributing to the eating and they are requesting psych/ counseling referral   Review of Systems  Constitutional: Negative for appetite change, diaphoresis, fatigue and unexpected weight change.  Eyes: Negative for pain, redness and visual disturbance.  Respiratory: Negative for cough, chest tightness, shortness of breath and wheezing.   Cardiovascular: Negative for chest pain, palpitations and leg swelling.  Gastrointestinal: Negative for abdominal distention and abdominal pain.  Endocrine: Negative for cold intolerance, heat intolerance, polydipsia, polyphagia and polyuria.  Genitourinary: Negative for difficulty urinating, dysuria and frequency.  Neurological: Negative for dizziness, light-headedness, numbness and headaches.    History Past Medical History:  Diagnosis Date  . Anxiety   . Asthma   . Bipolar 2 disorder ([place])   . Constipation   . Depression   . GERD (gastroesophageal reflux disease)   . Hives   . Joint pain   . Leg edema   . Migraine   . PTSD (post-traumatic stress disorder)   . Reflux   . Seizures ([place])   . Sleep apnea   . 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   Her family history includes Depression in her father; Diabetes in her maternal grandmother; Heart disease in her mother; Hyperlipidemia in her father and maternal grandmother; Hypertension in her  father, maternal grandmother, and mother.She reports that she quit smoking about 7 years ago. Her smoking use included cigarettes. She has a 0.20 pack-year smoking history. She has never used smokeless tobacco. She reports current alcohol use. She reports current drug use. Drug: Marijuana.  Current Outpatient Medications on File Prior to Visit  Medication Sig Dispense Refill  . amitriptyline (ELAVIL) 10 MG tablet TK 1 T PO QHS 90 tablet 1  . cetirizine (ZYRTEC) 10 MG tablet Take 10 mg by mouth daily.    . Cetirizine HCl (ZYRTEC ALLERGY) 10 MG CAPS Take 1 capsule (10 mg total) by mouth in the morning and at bedtime. 60 capsule 5  . FLUoxetine (PROZAC) 20 MG capsule Take 1 capsule (20 mg total) by mouth daily. 90 capsule 3  . furosemide (LASIX) 40 MG tablet Take 1 tablet (40 mg total) by mouth daily. 30 tablet 3  . ibuprofen (ADVIL) 800 MG tablet Take 800 mg by mouth every 8 (eight) hours as needed.    . metroNIDAZOLE (FLAGYL) 500 MG tablet Take 1 tablet (500 mg total) by mouth 2 (two) times daily. 14 tablet 0  . montelukast (SINGULAIR) 10 MG tablet Take 1 tablet (10 mg total) by mouth at bedtime. 30 tablet 3  . montelukast (SINGULAIR) 10 MG tablet Take 1 tablet (10 mg total) by mouth at bedtime. 30 tablet 2  . omeprazole (PRILOSEC) 20 MG capsule TAKE 1 CAPSULE BY MOUTH EVERY DAY 90 capsule 1  . promethazine (PHENERGAN) 25 MG tablet Take 0.5-1 tablets (12.5-25 mg total) by mouth  every 8 (eight) hours as needed for nausea or vomiting. 5 tablet 0  . VENTOLIN HFA 108 (90 Base) MCG/ACT inhaler Inhale 2 puffs into the lungs every 6 (six) hours as needed for wheezing or shortness of breath. 36 g 1  . levocetirizine (XYZAL) 5 MG tablet TAKE 2 TABLETS BY MOUTH EVERY MORNING 180 tablet 5  . montelukast (SINGULAIR) 10 MG tablet Take 1 tablet (10 mg total) by mouth daily. 30 tablet 5   No current facility-administered medications on file prior to visit.     Objective:  Objective  Physical Exam Vitals and  nursing note reviewed.  Constitutional:      Appearance: She is well-developed and well-nourished.  HENT:     Head: Normocephalic and atraumatic.  Eyes:     Extraocular Movements: EOM normal.     Conjunctiva/sclera: Conjunctivae normal.  Neck:     Thyroid: No thyromegaly.     Vascular: No carotid bruit or JVD.  Cardiovascular:     Rate and Rhythm: Normal rate and regular rhythm.     Heart sounds: Normal heart sounds. No murmur heard.   Pulmonary:     Effort: Pulmonary effort is normal. No respiratory distress.     Breath sounds: Normal breath sounds. No wheezing or rales.  Chest:     Chest wall: No tenderness.  Musculoskeletal:        General: No edema.     Cervical back: Normal range of motion and neck supple.  Neurological:     Mental Status: She is alert and oriented to person, place, and time.  Psychiatric:        Mood and Affect: Mood and affect and mood normal. Affect is flat.        Behavior: Behavior normal.        Thought Content: Thought content normal. Thought content does not include homicidal or suicidal ideation. Thought content does not include homicidal or suicidal plan.        Judgment: Judgment normal.    BP 126/88 (BP Location: Right Arm, Cuff Size: Large)   Pulse 87   Temp 97.8 F (36.6 C) (Oral)   Resp 12   Ht 5\' 1"  (1.549 m)   Wt 263 lb (119.3 kg)   LMP 07/07/2020   SpO2 98%   BMI 49.69 kg/m  Wt Readings from Last 3 Encounters:  07/14/20 263 lb (119.3 kg)  05/29/20 259 lb 6.4 oz (117.7 kg)  02/28/20 248 lb 12.8 oz (112.9 kg)     Lab Results  Component Value Date   WBC 7.7 01/15/2020   HGB 13.3 01/15/2020   HCT 40.8 01/15/2020   PLT 385.0 01/15/2020   GLUCOSE 86 01/15/2020   CHOL 150 01/09/2019   TRIG 126.0 01/09/2019   HDL 56.90 01/09/2019   LDLCALC 68 01/09/2019   ALT 51 (H) 01/15/2020   AST 26 01/15/2020   NA 140 01/15/2020   K 4.4 01/15/2020   CL 105 01/15/2020   CREATININE 0.81 01/15/2020   BUN 9 01/15/2020   CO2 31  01/15/2020   TSH 2.15 01/15/2020   HGBA1C 5.5 05/10/2018    No results found.   Assessment & Plan:  Plan  I am having Gina Cameron maintain her furosemide, cetirizine, ibuprofen, FLUoxetine, amitriptyline, montelukast, montelukast, promethazine, omeprazole, levocetirizine, montelukast, Ventolin HFA, ZyrTEC Allergy, and metroNIDAZOLE.  No orders of the defined types were placed in this encounter.   Problem List Items Addressed This Visit      Unprioritized  Class 3 severe obesity with serious comorbidity and body mass index (BMI) of 40.0 to 44.9 in adult [facility])    D/w pt the importance of weight loss----  Options are healthy weight and wellness , [employer],  My fitness pal Her dad asked about lap band / bariatric surgery-- that is an option but should be last resort Pt can also check into meds ---- wegovy / saxenda to see if they are covered        Relevant Orders   Amb Ref to Medical Weight Management   Depression, major, single episode, mild ([place])    Pt should con't meds She was given list of counselors / psych for help       Fatty liver - Primary    Recheck labs / [place] today Weight loss is important       Relevant Orders   Comprehensive metabolic panel   US Abdomen Limited RUQ (LIVER/GB)   Gamma GT   MORBID OBESITY    D/w with pt diet and exercise Healthy weight and wellness is and option again [employer] / my fitness pal also may help  D/w pt importance of portion control and eating 3 meals a day and 2 healthy snacks Pt can also look into whether her ins will pay for wegovy or saxenda to help with weight loss         Follow-up: Return in about 3 months (around 10/12/2020), or if symptoms worsen or fail to improve, for fatty liver.  Ann Held, DO

## 2020-07-14 NOTE — Assessment & Plan Note (Signed)
Pt should con't meds She was given list of counselors / psych for help

## 2020-07-14 NOTE — Telephone Encounter (Signed)
done

## 2020-07-14 NOTE — Patient Instructions (Addendum)
Weight loss is important for fatty liver and no alcohol You were given a list of counselors / psych for depression.  Please call one and make an appointment       Calorie Counting for Weight Loss Calories are units of energy. Your body needs a certain number of calories from food to keep going throughout the day. When you eat or drink more calories than your body needs, your body stores the extra calories mostly as fat. When you eat or drink fewer calories than your body needs, your body burns fat to get the energy it needs. Calorie counting means keeping track of how many calories you eat and drink each day. Calorie counting can be helpful if you need to lose weight. If you eat fewer calories than your body needs, you should lose weight. Ask your health care provider what a healthy weight is for you. For calorie counting to work, you will need to eat the right number of calories each day to lose a healthy amount of weight per week. A dietitian can help you figure out how many calories you need in a day and will suggest ways to reach your calorie goal.  A healthy amount of weight to lose each week is usually 1-2 lb (0.5-0.9 kg). This usually means that your daily calorie intake should be reduced by 500-750 calories.  Eating 1,200-1,500 calories a day can help most women lose weight.  Eating 1,500-1,800 calories a day can help most men lose weight. What do I need to know about calorie counting? Work with your health care provider or dietitian to determine how many calories you should get each day. To meet your daily calorie goal, you will need to:  Find out how many calories are in each food that you would like to eat. Try to do this before you eat.  Decide how much of the food you plan to eat.  Keep a food log. Do this by writing down what you ate and how many calories it had. To successfully lose weight, it is important to balance calorie counting with a healthy lifestyle that includes  regular activity. Where do I find calorie information? The number of calories in a food can be found on a Nutrition Facts label. If a food does not have a Nutrition Facts label, try to look up the calories online or ask your dietitian for help. Remember that calories are listed per serving. If you choose to have more than one serving of a food, you will have to multiply the calories per serving by the number of servings you plan to eat. For example, the label on a package of bread might say that a serving size is 1 slice and that there are 90 calories in a serving. If you eat 1 slice, you will have eaten 90 calories. If you eat 2 slices, you will have eaten 180 calories.   How do I keep a food log? After each time that you eat, record the following in your food log as soon as possible:  What you ate. Be sure to include toppings, sauces, and other extras on the food.  How much you ate. This can be measured in cups, ounces, or number of items.  How many calories were in each food and drink.  The total number of calories in the food you ate. Keep your food log near you, such as in a pocket-sized notebook or on an app or website on your mobile phone. Some programs  will calculate calories for you and show you how many calories you have left to meet your daily goal. What are some portion-control tips?  Know how many calories are in a serving. This will help you know how many servings you can have of a certain food.  Use a measuring cup to measure serving sizes. You could also try weighing out portions on a kitchen scale. With time, you will be able to estimate serving sizes for some foods.  Take time to put servings of different foods on your favorite plates or in your favorite bowls and cups so you know what a serving looks like.  Try not to eat straight from a food's packaging, such as from a bag or box. Eating straight from the package makes it hard to see how much you are eating and can lead to  overeating. Put the amount you would like to eat in a cup or on a plate to make sure you are eating the right portion.  Use smaller plates, glasses, and bowls for smaller portions and to prevent overeating.  Try not to multitask. For example, avoid watching TV or using your computer while eating. If it is time to eat, sit down at a table and enjoy your food. This will help you recognize when you are full. It will also help you be more mindful of what and how much you are eating. What are tips for following this plan? Reading food labels  Check the calorie count compared with the serving size. The serving size may be smaller than what you are used to eating.  Check the source of the calories. Try to choose foods that are high in protein, fiber, and vitamins, and low in saturated fat, trans fat, and sodium. Shopping  Read nutrition labels while you shop. This will help you make healthy decisions about which foods to buy.  Pay attention to nutrition labels for low-fat or fat-free foods. These foods sometimes have the same number of calories or more calories than the full-fat versions. They also often have added sugar, starch, or salt to make up for flavor that was removed with the fat.  Make a grocery list of lower-calorie foods and stick to it. Cooking  Try to cook your favorite foods in a healthier way. For example, try baking instead of frying.  Use low-fat dairy products. Meal planning  Use more fruits and vegetables. One-half of your plate should be fruits and vegetables.  Include lean proteins, such as chicken, [place], and fish. Lifestyle Each week, aim to do one of the following:  150 minutes of moderate exercise, such as walking.  75 minutes of vigorous exercise, such as running. General information  Know how many calories are in the foods you eat most often. This will help you calculate calorie counts faster.  Find a way of tracking calories that works for you. Get  creative. Try different apps or programs if writing down calories does not work for you. What foods should I eat?  Eat nutritious foods. It is better to have a nutritious, high-calorie food, such as an avocado, than a food with few nutrients, such as a bag of potato chips.  Use your calories on foods and drinks that will fill you up and will not leave you hungry soon after eating. ? Examples of foods that fill you up are nuts and nut butters, vegetables, lean proteins, and high-fiber foods such as whole grains. High-fiber foods are foods with more than 5 g  of fiber per serving.  Pay attention to calories in drinks. Low-calorie drinks include water and unsweetened drinks. The items listed above may not be a complete list of foods and beverages you can eat. Contact a dietitian for more information.   What foods should I limit? Limit foods or drinks that are not good sources of vitamins, minerals, or protein or that are high in unhealthy fats. These include:  Candy.  Other sweets.  Sodas, specialty coffee drinks, alcohol, and juice. The items listed above may not be a complete list of foods and beverages you should avoid. Contact a dietitian for more information. How do I count calories when eating out?  Pay attention to portions. Often, portions are much larger when eating out. Try these tips to keep portions smaller: ? Consider sharing a meal instead of getting your own. ? If you get your own meal, eat only half of it. Before you start eating, ask for a container and put half of your meal into it. ? When available, consider ordering smaller portions from the menu instead of full portions.  Pay attention to your food and drink choices. Knowing the way food is cooked and what is included with the meal can help you eat fewer calories. ? If calories are listed on the menu, choose the lower-calorie options. ? Choose dishes that include vegetables, fruits, whole grains, low-fat dairy products,  and lean proteins. ? Choose items that are boiled, broiled, grilled, or steamed. Avoid items that are buttered, battered, fried, or served with cream sauce. Items labeled as crispy are usually fried, unless stated otherwise. ? Choose water, low-fat milk, unsweetened iced tea, or other drinks without added sugar. If you want an alcoholic beverage, choose a lower-calorie option, such as a glass of wine or light beer. ? Ask for dressings, sauces, and syrups on the side. These are usually high in calories, so you should limit the amount you eat. ? If you want a salad, choose a garden salad and ask for grilled meats. Avoid extra toppings such as bacon, cheese, or fried items. Ask for the dressing on the side, or ask for olive oil and vinegar or lemon to use as dressing.  Estimate how many servings of a food you are given. Knowing serving sizes will help you be aware of how much food you are eating at restaurants. Where to find more information  Centers for Disease Control and Prevention: http://www.wolf.info/  [place] Department of Agriculture: http://www.wilson-mendoza.org/ Summary  Calorie counting means keeping track of how many calories you eat and drink each day. If you eat fewer calories than your body needs, you should lose weight.  A healthy amount of weight to lose per week is usually 1-2 lb (0.5-0.9 kg). This usually means reducing your daily calorie intake by 500-750 calories.  The number of calories in a food can be found on a Nutrition Facts label. If a food does not have a Nutrition Facts label, try to look up the calories online or ask your dietitian for help.  Use smaller plates, glasses, and bowls for smaller portions and to prevent overeating.  Use your calories on foods and drinks that will fill you up and not leave you hungry shortly after a meal. This information is not intended to replace advice given to you by your health care provider. Make sure you discuss any questions you have with your health care  provider. Document Revised: 07/19/2019 Document Reviewed: 07/19/2019 Elsevier Patient Education  2021 [employer].

## 2020-07-14 NOTE — Assessment & Plan Note (Signed)
Recheck labs / [place] today Weight loss is important

## 2020-07-14 NOTE — Addendum Note (Signed)
Addended by: Roma Schanz R on: 07/14/2020 10:49 AM   Modules accepted: Orders

## 2020-07-14 NOTE — Assessment & Plan Note (Signed)
D/w with pt diet and exercise Healthy weight and wellness is and option again [employer] / my fitness pal also may help  D/w pt importance of portion control and eating 3 meals a day and 2 healthy snacks Pt can also look into whether her ins will pay for wegovy or saxenda to help with weight loss

## 2020-07-14 NOTE — Assessment & Plan Note (Signed)
D/w pt the importance of weight loss----  Options are healthy weight and wellness , [employer],  My fitness pal Her dad asked about lap band / bariatric surgery-- that is an option but should be last resort Pt can also check into meds ---- wegovy / saxenda to see if they are covered

## 2020-07-15 ENCOUNTER — Ambulatory Visit (INDEPENDENT_AMBULATORY_CARE_PROVIDER_SITE_OTHER): Payer: Medicare Other | Admitting: Family Medicine

## 2020-07-15 ENCOUNTER — Encounter (INDEPENDENT_AMBULATORY_CARE_PROVIDER_SITE_OTHER): Payer: Self-pay | Admitting: Family Medicine

## 2020-07-15 ENCOUNTER — Ambulatory Visit (HOSPITAL_BASED_OUTPATIENT_CLINIC_OR_DEPARTMENT_OTHER)
Admission: RE | Admit: 2020-07-15 | Discharge: 2020-07-15 | Disposition: A | Discharge status: Home / Self Care | Payer: Medicare Other | Source: Ambulatory Visit | Attending: Family Medicine | Admitting: Family Medicine

## 2020-07-15 VITALS — BP 129/88 | HR 82 | Temp 97.9°F | Ht 60.0 in | Wt 255.0 lb

## 2020-07-15 DIAGNOSIS — Z6841 Body Mass Index (BMI) 40.0 and over, adult: Secondary | ICD-10-CM

## 2020-07-15 DIAGNOSIS — F319 Bipolar disorder, unspecified: Secondary | ICD-10-CM | POA: Diagnosis not present

## 2020-07-15 DIAGNOSIS — F39 Unspecified mood [affective] disorder: Secondary | ICD-10-CM | POA: Diagnosis not present

## 2020-07-15 DIAGNOSIS — K760 Fatty (change of) liver, not elsewhere classified: Secondary | ICD-10-CM | POA: Diagnosis present

## 2020-07-15 DIAGNOSIS — Z9189 Other specified personal risk factors, not elsewhere classified: Secondary | ICD-10-CM | POA: Diagnosis not present

## 2020-07-15 DIAGNOSIS — J45909 Unspecified asthma, uncomplicated: Secondary | ICD-10-CM | POA: Diagnosis not present

## 2020-07-15 DIAGNOSIS — R0602 Shortness of breath: Secondary | ICD-10-CM

## 2020-07-15 DIAGNOSIS — R5383 Other fatigue: Secondary | ICD-10-CM | POA: Diagnosis not present

## 2020-07-16 NOTE — Progress Notes (Signed)
Chief Complaint:   OBESITY Gina Cameron (MR# [id number]) is a 35 y.o. female who presents for evaluation and treatment of obesity and related comorbidities. Current BMI is Body mass index is 49.8 kg/m. Gina Cameron has been struggling with her weight for many years and has been unsuccessful in either losing weight, maintaining weight loss, or reaching her healthy weight goal.  Gina Cameron is currently in the action stage of change and ready to dedicate time achieving and maintaining a healthier weight. Gina Cameron is interested in becoming our patient and working on intensive lifestyle modifications including (but not limited to) diet and exercise for weight loss.  Gina Cameron is single and lives alone. She is on disability for seizure disorder, but no seizure since 2019. She snacks on chocolate, especially at night. Pt was on our program in 2019. Her last appt was 05/24/2018 with Dr. Leafy Ro. Pt states she is unable to buy foods on plan until next month since she used all her food stamps for this months groceries.  [name]'s habits were reviewed today and are as follows: her desired weight loss is 65 lbs, she has been heavy most of her life, her heaviest weight ever was 263 pounds, she has significant food cravings issues, she snacks frequently in the evenings, she wakes up frequently in the middle of the night to eat, she is frequently drinking liquids with calories, she frequently makes poor food choices, she frequently eats larger portions than normal, she has binge eating behaviors and she struggles with emotional eating.  This is the patient's first visit at [facility].  The patient's NEW PATIENT PACKET that they filled out prior to today's office visit was reviewed at length and information from that paperwork was included within the following office visit note.    Included in the packet: current and past health history, medications, allergies, ROS, gynecologic history (women only), surgical  history, family history, social history, weight history, weight loss surgery history (for those that have had weight loss surgery), nutritional evaluation, mood and food questionnaire along with a depression screening (PHQ9) on all patients, an Epworth questionnaire, sleep habits questionnaire, patient life and health improvement goals questionnaire. These will all be scanned into the patient's chart under media.   During the visit, I independently reviewed the patient's EKG, bioimpedance scale results, and indirect calorimeter results. I used this information to tailor a meal plan for the patient that will help Gina Cameron to lose weight and will improve her obesity-related conditions going forward.  I performed a medically necessary appropriate examination and/or evaluation. I discussed the assessment and treatment plan with the patient. The patient was provided an opportunity to ask questions and all were answered. The patient agreed with the plan and demonstrated an understanding of the instructions. Labs were ordered today (unless patient declined them) and will be reviewed with the patient at our next visit unless more critical results need to be addressed immediately. Clinical information was updated and documented in the EMR.  Time spent on visit including pre-visit chart review and post-visit care was estimated to be 55 minutes.  A separate 15 minutes was spent on risk counseling (see above/below).   Depression Screen Gina Cameron's Food and Mood (modified PHQ-9) score was 12.  Depression screen PHQ 2/9 07/15/2020  Decreased Interest 1  Down, Depressed, Hopeless 1  PHQ - 2 Score 2  Altered sleeping 2  Tired, decreased energy 2  Change in appetite 2  Feeling bad or failure about yourself  2  Trouble concentrating 2  Moving slowly or fidgety/restless 0  Suicidal thoughts 0  PHQ-9 Score 12  Difficult doing work/chores Not difficult at all    Assessment/Plan:   1. Other fatigue Gina Cameron admits to  daytime somnolence and admits to waking up still tired. Patent has a history of symptoms of daytime fatigue. Gina Cameron generally gets 10 hours of sleep per night, and states that she has poor sleep quality. Snoring is present. Apneic episodes are present. Epworth Sleepiness Score is 3.  Plan: Denece does feel that her weight is causing her energy to be lower than it should be. Fatigue may be related to obesity, depression or many other causes. Labs will be ordered, and in the meanwhile, Gina Cameron will focus on self care including making healthy food choices, increasing physical activity and focusing on stress reduction.  Lab/Orders today or future: - Vitamin B12 - CBC with Differential/Platelet - Comprehensive metabolic panel - Folate - Hemoglobin A1c - Insulin, random - Lipid panel - T3 - T4, free - TSH - VITAMIN D 25 Hydroxy (Vit-D Deficiency, Fractures)  2. SOBOE (shortness of breath on exertion) Gina Cameron notes increasing shortness of breath with exercising and seems to be worsening over time with weight gain. She notes getting out of breath sooner with activity than she used to. This has gotten worse recently. Gina Cameron denies shortness of breath at rest or orthopnea.  Plan: Gina Cameron does feel that she gets out of breath more easily than she used to when she exercises. Gina Cameron's shortness of breath appears to be obesity related and exercise induced. She has agreed to work on weight loss and gradually increase exercise to treat her exercise induced shortness of breath. Will continue to monitor closely.  Lab/Orders today or future: - Vitamin B12 - CBC with Differential/Platelet - Comprehensive metabolic panel - Folate - Hemoglobin A1c - Insulin, random - Lipid panel - T3 - T4, free - TSH - VITAMIN D 25 Hydroxy (Vit-D Deficiency, Fractures)  3. Reactive airway disease without complication, unspecified asthma severity, unspecified whether persistent Pt has seasonal allergies. She takes Singulair.  Symptoms are stable, per pt. No recent exacerbations or increased symptoms.   Plan: Check labs today. Medication management per PCP. Explained to pt that weight loss also help some with breathing issues.   Lab/Orders today or future: - Vitamin B12 - CBC with Differential/Platelet - Comprehensive metabolic panel - Folate - Hemoglobin A1c - Insulin, random - Lipid panel - T3 - T4, free - TSH - VITAMIN D 25 Hydroxy (Vit-D Deficiency, Fractures)  4. 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 She is taking Prozac. Pt denies suicidal or homicidal ideations and, per pt, her depression is stable currently.  Plan: Check labs today. Pt and I discussed strategies to avoid emotional eating. Recommend she go through PCP for recommendations on counselor for CBT.  Lab/Orders today or future: - Vitamin B12 - CBC with Differential/Platelet - Comprehensive metabolic panel - Folate - Hemoglobin A1c - Insulin, random - Lipid panel - T3 - T4, free - TSH - VITAMIN D 25 Hydroxy (Vit-D Deficiency, Fractures)  5. Bipolar depression (HCC) PHQ-9 score of 12. Pt is on Prozac. She does not have a psychiatrist currently, as she is treated by PCP. Advised by PCP she needs a psychiatrist and counselor. Provided pt with a list of providers to call.   Plan: Treatment per PCP. Recommend to pt that psychiatrist and counselor are likely needed for her to get  better control of her emotional well-being.  Lab/Orders today or future: - Vitamin B12 - CBC with Differential/Platelet - Comprehensive metabolic panel - Folate - Hemoglobin A1c - Insulin, random - Lipid panel - T3 - T4, free - TSH - VITAMIN D 25 Hydroxy (Vit-D Deficiency, Fractures)  6. Nonalcoholic hepatosteatosis Pt was told by PCP recently that she has fatty liver. Pt is not sure how she was diagnosed with it.  Plan: Check labs today. Weight loss via prudent nutritional  plan.  Lab/Orders today or future: - Vitamin B12 - CBC with Differential/Platelet - Comprehensive metabolic panel - Folate - Hemoglobin A1c - Insulin, random - Lipid panel - T3 - T4, free - TSH - VITAMIN D 25 Hydroxy (Vit-D Deficiency, Fractures)  7. At risk for depression Christlyn was given approximately 15 minutes of depression risk counseling today. She has risk factors for depression. We discussed the importance of a healthy work life balance, a healthy relationship with food and a good support system.  8. Class 3 severe obesity with serious comorbidity and body mass index (BMI) of 45.0 to 49.9 in adult, unspecified obesity type (HCC) Toyia is currently in the action stage of change and her goal is to continue with weight loss efforts. I recommend Mykesha begin the structured treatment plan as follows:  She has agreed to the Category 3 Plan.  Exercise goals: As is   Behavioral modification strategies: Explained to pt the importance of getting counselor through PCP to provide emotional support throughout her life stressors. increasing lean protein intake, decreasing simple carbohydrates, no skipping meals, keeping healthy foods in the home and ways to avoid night time snacking.  She was informed of the importance of frequent follow-up visits to maximize her success with intensive lifestyle modifications for her multiple health conditions. She was informed we would discuss her lab results at her next visit unless there is a critical issue that needs to be addressed sooner. Tanyjah agreed to keep her next visit at the agreed upon time to discuss these results.  Objective:   Blood pressure 129/88, pulse 82, temperature 97.9 F (36.6 C), height 5' (1.524 m), weight 255 lb (115.7 kg), last menstrual period 07/07/2020, SpO2 98 %. Body mass index is 49.8 kg/m.  EKG: Normal sinus rhythm, rate 96 (05/04/2020).  Indirect Calorimeter completed today shows a VO2 of 350 and a REE of 2440.  Her  calculated basal metabolic rate is [id number] thus her basal metabolic rate is better than expected.  General: Cooperative, alert, well developed, in no acute distress. HEENT: Conjunctivae and lids unremarkable. Cardiovascular: Regular rhythm.  Lungs: Normal work of breathing. Neurologic: No focal deficits.   Lab Results  Component Value Date   CREATININE 0.64 07/15/2020   BUN 7 07/15/2020   NA 142 07/15/2020   K 4.5 07/15/2020   CL 105 07/15/2020   CO2 23 07/15/2020   Lab Results  Component Value Date   ALT 80 (H) 07/15/2020   AST 44 (H) 07/15/2020   ALKPHOS 74 07/15/2020   BILITOT 0.3 07/15/2020   Lab Results  Component Value Date   HGBA1C 6.1 (H) 07/15/2020   HGBA1C 5.5 05/10/2018   Lab Results  Component Value Date   INSULIN 16.4 07/15/2020   INSULIN 31.3 (H) 05/10/2018   Lab Results  Component Value Date   TSH 1.400 07/15/2020   Lab Results  Component Value Date   CHOL 170 07/15/2020   HDL 46 07/15/2020   LDLCALC 106 (H) 07/15/2020   TRIG 100  07/15/2020   CHOLHDL 3.7 07/15/2020   Lab Results  Component Value Date   WBC 4.9 07/15/2020   HGB 14.4 07/15/2020   HCT 43.8 07/15/2020   MCV 86 07/15/2020   PLT 420 07/15/2020   Lab Results  Component Value Date   IRON 117 01/15/2020   FERRITIN 124.1 01/15/2020    Attestation Statements:   Reviewed by clinician on day of visit: allergies, medications, problem list, medical history, surgical history, family history, social history, and previous encounter notes.  Coral Ceo, am acting as Location manager for [employer], DO.  I have reviewed the above documentation for accuracy and completeness, and I agree with the above. Marjory Sneddon, D.O.  The [place] was signed into law in 2016 which includes the topic of electronic health records.  This provides immediate access to information in MyChart.  This includes consultation notes, operative notes, office notes, lab results and  pathology reports.  If you have any questions about what you read please let us know at your next visit so we can discuss your concerns and take corrective action if need be.  We are right here with you.

## 2020-07-18 LAB — VITAMIN D 25 HYDROXY (VIT D DEFICIENCY, FRACTURES): Vit D, 25-Hydroxy: 12.7 ng/mL — ABNORMAL LOW (ref 30.0–100.0)

## 2020-07-18 LAB — CBC WITH DIFFERENTIAL/PLATELET
Basophils Absolute: 0 10*3/uL (ref 0.0–0.2)
Basos: 1 %
EOS (ABSOLUTE): 0.1 10*3/uL (ref 0.0–0.4)
Eos: 2 %
Hematocrit: 43.8 % (ref 34.0–46.6)
Hemoglobin: 14.4 g/dL (ref 11.1–15.9)
Immature Grans (Abs): 0 10*3/uL (ref 0.0–0.1)
Immature Granulocytes: 0 %
Lymphocytes Absolute: 2.5 10*3/uL (ref 0.7–3.1)
Lymphs: 52 %
MCH: 28.2 pg (ref 26.6–33.0)
MCHC: 32.9 g/dL (ref 31.5–35.7)
MCV: 86 fL (ref 79–97)
Monocytes Absolute: 0.5 10*3/uL (ref 0.1–0.9)
Monocytes: 9 %
Neutrophils Absolute: 1.8 10*3/uL (ref 1.4–7.0)
Neutrophils: 36 %
Platelets: 420 10*3/uL (ref 150–450)
RBC: 5.1 x10E6/uL (ref 3.77–5.28)
RDW: 13.8 % (ref 11.7–15.4)
WBC: 4.9 10*3/uL (ref 3.4–10.8)

## 2020-07-18 LAB — COMPREHENSIVE METABOLIC PANEL
ALT: 80 IU/L — ABNORMAL HIGH (ref 0–32)
AST: 44 IU/L — ABNORMAL HIGH (ref 0–40)
Albumin/Globulin Ratio: 1.4 (ref 1.2–2.2)
Albumin: 4 g/dL (ref 3.8–4.8)
Alkaline Phosphatase: 74 IU/L (ref 44–121)
BUN/Creatinine Ratio: 11 (ref 9–23)
BUN: 7 mg/dL (ref 6–20)
Bilirubin Total: 0.3 mg/dL (ref 0.0–1.2)
CO2: 23 mmol/L (ref 20–29)
Calcium: 9.1 mg/dL (ref 8.7–10.2)
Chloride: 105 mmol/L (ref 96–106)
Creatinine, Ser: 0.64 mg/dL (ref 0.57–1.00)
GFR calc Af Amer: 137 mL/min/{1.73_m2} (ref >59)
GFR calc non Af Amer: 118 mL/min/{1.73_m2} (ref >59)
Globulin, Total: 2.9 g/dL (ref 1.5–4.5)
Glucose: 78 mg/dL (ref 65–99)
Potassium: 4.5 mmol/L (ref 3.5–5.2)
Sodium: 142 mmol/L (ref 134–144)
Total Protein: 6.9 g/dL (ref 6.0–8.5)

## 2020-07-18 LAB — LIPID PANEL
Chol/HDL Ratio: 3.7 ratio (ref 0.0–4.4)
Cholesterol, Total: 170 mg/dL (ref 100–199)
HDL: 46 mg/dL (ref >39)
LDL Chol Calc (NIH): 106 mg/dL — ABNORMAL HIGH (ref 0–99)
Triglycerides: 100 mg/dL (ref 0–149)
VLDL Cholesterol Cal: 18 mg/dL (ref 5–40)

## 2020-07-18 LAB — HEMOGLOBIN A1C
Est. average glucose Bld gHb Est-mCnc: 128 mg/dL
Hgb A1c MFr Bld: 6.1 % — ABNORMAL HIGH (ref 4.8–5.6)

## 2020-07-18 LAB — T3: T3, Total: 159 ng/dL (ref 71–180)

## 2020-07-18 LAB — INSULIN, RANDOM: INSULIN: 16.4 u[IU]/mL (ref 2.6–24.9)

## 2020-07-18 LAB — VITAMIN B12: Vitamin B-12: 664 pg/mL (ref 232–1245)

## 2020-07-18 LAB — T4, FREE: Free T4: 1.07 ng/dL (ref 0.82–1.77)

## 2020-07-18 LAB — FOLATE: Folate: 16 ng/mL (ref >3.0)

## 2020-07-18 LAB — TSH: TSH: 1.4 u[IU]/mL (ref 0.450–4.500)

## 2020-07-28 ENCOUNTER — Ambulatory Visit (INDEPENDENT_AMBULATORY_CARE_PROVIDER_SITE_OTHER): Payer: Medicare Other | Admitting: Otolaryngology

## 2020-07-29 ENCOUNTER — Ambulatory Visit (INDEPENDENT_AMBULATORY_CARE_PROVIDER_SITE_OTHER): Payer: Medicare Other | Admitting: Family Medicine

## 2020-07-29 ENCOUNTER — Other Ambulatory Visit: Payer: Self-pay

## 2020-07-29 ENCOUNTER — Encounter (INDEPENDENT_AMBULATORY_CARE_PROVIDER_SITE_OTHER): Payer: Self-pay | Admitting: Family Medicine

## 2020-07-29 VITALS — BP 129/82 | HR 92 | Temp 98.3°F | Ht 60.0 in | Wt 254.0 lb

## 2020-07-29 DIAGNOSIS — Z9189 Other specified personal risk factors, not elsewhere classified: Secondary | ICD-10-CM

## 2020-07-29 DIAGNOSIS — E7849 Other hyperlipidemia: Secondary | ICD-10-CM

## 2020-07-29 DIAGNOSIS — R7303 Prediabetes: Secondary | ICD-10-CM

## 2020-07-29 DIAGNOSIS — R600 Localized edema: Secondary | ICD-10-CM

## 2020-07-29 DIAGNOSIS — Z6841 Body Mass Index (BMI) 40.0 and over, adult: Secondary | ICD-10-CM

## 2020-07-29 DIAGNOSIS — K760 Fatty (change of) liver, not elsewhere classified: Secondary | ICD-10-CM

## 2020-07-29 DIAGNOSIS — E559 Vitamin D deficiency, unspecified: Secondary | ICD-10-CM

## 2020-07-29 DIAGNOSIS — E66813 Obesity, class 3: Secondary | ICD-10-CM

## 2020-07-29 MED ORDER — VITAMIN D (ERGOCALCIFEROL) 1.25 MG (50000 UNIT) PO CAPS: 50000.0000 [IU] | ORAL_CAPSULE | ORAL | 0 refills | Status: DC

## 2020-07-31 ENCOUNTER — Other Ambulatory Visit (INDEPENDENT_AMBULATORY_CARE_PROVIDER_SITE_OTHER): Payer: Self-pay | Admitting: Family Medicine

## 2020-07-31 ENCOUNTER — Other Ambulatory Visit: Payer: Self-pay | Admitting: Family Medicine

## 2020-07-31 DIAGNOSIS — G4700 Insomnia, unspecified: Secondary | ICD-10-CM

## 2020-07-31 NOTE — Progress Notes (Signed)
Chief Complaint:   OBESITY Gina Cameron is here to discuss her progress with her obesity treatment plan along with follow-up of her obesity related diagnoses.   Today's visit was #: 2 Starting weight: 255 lbs Starting date: 07/15/2020 Today's weight: 254 lbs Today's date: 07/29/2020 Total lbs lost to date: 1 lb Body mass index is 49.61 kg/m.  Total weight loss percentage to date: -0.39%  Interim History:  Gina Cameron is here today to review her NEW Meal Plan and to discuss all recent labs done here and/ or done at outside facilities. This is patient's first follow up visit. Extended time was spent counseling [name] on all new disease processes that were discovered or that are worsening.   [name] initially states that she followed the plan 100%, but upon questioning, she is using butter, noodles, and other foods that are not on plan.  Nutrition Plan: Category 3 Plan for 70% of the time. Activity: Walking for 30 minutes 2-3 times per week.  Assessment/Plan:   Meds ordered this encounter  Medications  . Vitamin D, Ergocalciferol, (DRISDOL) 1.25 MG (50000 UNIT) CAPS capsule    Sig: Take 1 capsule (50,000 Units total) by mouth every 7 (seven) days.    Dispense:  4 capsule    Refill:  0    Needs OV for Refills     1. Nonalcoholic hepatosteatosis Worsening.  Discussed labs with patient today.    Plan: NAFLD is an umbrella term that encompasses a disease spectrum that includes steatosis (fat) without inflammation, steatohepatitis (NASH; fat + inflammation in a characteristic pattern), and cirrhosis. Bland steatosis is felt to be a benign condition, with extremely low to no risk of progression to cirrhosis, whereas NASH can progress to cirrhosis. The mainstay of treatment of NAFLD includes lifestyle modification to achieve weight loss, at least 7% of current body weight. Low carbohydrate diets can be beneficial in improving NAFLD liver histology. Additionally, exercise, even the  absence of weight loss can have beneficial effects on the patient's metabolic profile and liver health.   2. Lower extremity edema Discussed labs with patient today.  Sx improving.  Less swelling with eating better and less back pain as well.  Taking Lasix 40 mg daily as needed by another doc.  Plan:  Lasix per PCP as needed.  Continue prudent nutritional plan.  Continue weight loss.  We will monitor along with weight loss journey. INSTRUCTIONS TO DECREASED THE SWELLING IN YOUR LEGS:  Minimize use of anti-inflammatories (Advil, Naprosyn, Aleve). Try Tylenol (acetaminophen) instead. Do not use more than 3 grams (3000 mg) total per day.  Use your legs. Working the lower leg muscles helps pump the fluid out of your legs.  Avoid prolonged standing if possible.  Elevate your legs when you are sitting.  Use support stockings daily.  Limit your salt intake.   3. Other hyperlipidemia Discussed labs with patient today.  Course: Not at goal. Lipid-lowering medications: None.  Diet controlled.  Hyperlipidemia runs in the family, she says.  Plan:  Increased LDL.  Continue prudent nutritional plan and weight loss.  Increase exercise and decrease animal fats.   Dietary changes: Increase soluble fiber, decrease simple carbohydrates, decrease saturated fat. Exercise changes: Moderate to vigorous-intensity aerobic activity 150 minutes per week or as tolerated. We will continue to monitor along with PCP/specialists as it pertains to her weight loss journey.  Lab Results  Component Value Date   CHOL 170 07/15/2020   HDL 46 07/15/2020  LDLCALC 106 (H) 07/15/2020   TRIG 100 07/15/2020   CHOLHDL 3.7 07/15/2020   Lab Results  Component Value Date   ALT 80 (H) 07/15/2020   AST 44 (H) 07/15/2020   ALKPHOS 74 07/15/2020   BILITOT 0.3 07/15/2020   4. Vitamin D deficiency New.  Discussed labs with patient today.  Not at goal. Current vitamin D is 12.7, tested on 07/15/2020. Optimal goal > 50 ng/dL.    Plan:  Start to take prescription Vitamin D @50 ,000 IU every week as prescribed.  Follow-up for routine testing of Vitamin D, at least 2-3 times per year to avoid over-replacement.  - Start Vitamin D, Ergocalciferol, (DRISDOL) 1.25 MG (50000 UNIT) CAPS capsule; Take 1 capsule (50,000 Units total) by mouth every 7 (seven) days.  Dispense: 4 capsule; Refill: 0  5. Prediabetes New.  Discussed labs with patient today. Not at goal. Goal is HgbA1c < 5.7.  Medication: None.    Plan:  Handouts given on prediabetes.  Consider adding medications in the future.  She will continue to focus on protein-rich, low simple carbohydrate foods. We reviewed the importance of hydration, regular exercise for stress reduction, and restorative sleep.   Lab Results  Component Value Date   HGBA1C 6.1 (H) 07/15/2020   Lab Results  Component Value Date   INSULIN 16.4 07/15/2020   INSULIN 31.3 (H) 05/10/2018   6. At risk for Diabetes - Zakyla was given diabetes prevention education and counseling today of more than 23 minutes.  - Counseled patient on pathophysiology of disease and meaning/ implication of lab results.  - Reviewed how certain foods can either stimulate or inhibit insulin release, and subsequently affect hunger pathways  - Importance of following a healthy meal plan with limiting amounts of simple carbohydrates discussed with patient - Effects of regular aerobic exercise on blood sugar regulation reviewed and encouraged an eventual goal of 30 min 5d/week or more as a minimum.  - Briefly discussed treatment options, which always include dietary and lifestyle modification as first line.   - Handouts provided at patient's desire and/or told to go online to the American Diabetes Association website for further information.   7. Class 3 severe obesity with serious comorbidity and body mass index (BMI) of 45.0 to 49.9 in adult, unspecified obesity type ([place])  Course: [name] is currently in the action stage  of change. As such, her goal is to continue with weight loss efforts.   Nutrition goals: She has agreed to the Category 3 Plan.   Exercise goals: As is.  Behavioral modification strategies: increasing lean protein intake, decreasing simple carbohydrates, increasing water intake, decreasing liquid calories, decreasing sodium intake, meal planning and cooking strategies, keeping healthy foods in the home, better snacking choices and planning for success.  Irasema has agreed to follow-up with our clinic in 2 weeks. She was informed of the importance of frequent follow-up visits to maximize her success with intensive lifestyle modifications for her multiple health conditions.   Objective:   Blood pressure 129/82, pulse 92, temperature 98.3 F (36.8 C), height 5' (1.524 m), weight 254 lb (115.2 kg), last menstrual period 07/07/2020, SpO2 98 %. Body mass index is 49.61 kg/m.  General: Cooperative, alert, well developed, in no acute distress. HEENT: Conjunctivae and lids unremarkable. Cardiovascular: Regular rhythm.  Lungs: Normal work of breathing. Neurologic: No focal deficits.   Lab Results  Component Value Date   CREATININE 0.64 07/15/2020   BUN 7 07/15/2020   NA 142 07/15/2020   K  4.5 07/15/2020   CL 105 07/15/2020   CO2 23 07/15/2020   Lab Results  Component Value Date   ALT 80 (H) 07/15/2020   AST 44 (H) 07/15/2020   ALKPHOS 74 07/15/2020   BILITOT 0.3 07/15/2020   Lab Results  Component Value Date   HGBA1C 6.1 (H) 07/15/2020   HGBA1C 5.5 05/10/2018   Lab Results  Component Value Date   INSULIN 16.4 07/15/2020   INSULIN 31.3 (H) 05/10/2018   Lab Results  Component Value Date   TSH 1.400 07/15/2020   Lab Results  Component Value Date   CHOL 170 07/15/2020   HDL 46 07/15/2020   LDLCALC 106 (H) 07/15/2020   TRIG 100 07/15/2020   CHOLHDL 3.7 07/15/2020   Lab Results  Component Value Date   WBC 4.9 07/15/2020   HGB 14.4 07/15/2020   HCT 43.8 07/15/2020   MCV  86 07/15/2020   PLT 420 07/15/2020   Lab Results  Component Value Date   IRON 117 01/15/2020   FERRITIN 124.1 01/15/2020   Obesity Behavioral Intervention:   Approximately 15 minutes were spent on the discussion below.  ASK: We discussed the diagnosis of obesity with [name] today and Tenia agreed to give [place] permission to discuss obesity behavioral modification therapy today.  ASSESS: Sheyann has the diagnosis of obesity and her BMI today is 49.7. Rhylie is in the action stage of change.   ADVISE: Jorge was educated on the multiple health risks of obesity as well as the benefit of weight loss to improve her health. She was advised of the need for long term treatment and the importance of lifestyle modifications to improve her current health and to decrease her risk of future health problems.  AGREE: Multiple dietary modification options and treatment options were discussed and Raechal agreed to follow the recommendations documented in the above note.  ARRANGE: Kailoni was educated on the importance of frequent visits to treat obesity as outlined per CMS and USPSTF guidelines and agreed to schedule her next follow up appointment today.  Attestation Statements:   Reviewed by clinician on day of visit: allergies, medications, problem list, medical history, surgical history, family history, social history, and previous encounter notes.  I, Water quality scientist, CMA, am acting as Location manager for [employer], DO.  I have reviewed the above documentation for accuracy and completeness, and I agree with the above. Marjory Sneddon, D.O.  The [place] was signed into law in 2016 which includes the topic of electronic health records.  This provides immediate access to information in MyChart.  This includes consultation notes, operative notes, office notes, lab results and pathology reports.  If you have any questions about what you read please let us know at your next visit so we can  discuss your concerns and take corrective action if need be.  We are right here with you.

## 2020-08-03 ENCOUNTER — Other Ambulatory Visit: Payer: Self-pay | Admitting: Family Medicine

## 2020-08-03 DIAGNOSIS — Z9189 Other specified personal risk factors, not elsewhere classified: Secondary | ICD-10-CM | POA: Insufficient documentation

## 2020-08-03 DIAGNOSIS — K219 Gastro-esophageal reflux disease without esophagitis: Secondary | ICD-10-CM

## 2020-08-05 ENCOUNTER — Ambulatory Visit (INDEPENDENT_AMBULATORY_CARE_PROVIDER_SITE_OTHER): Payer: Medicare Other | Admitting: Otolaryngology

## 2020-08-11 ENCOUNTER — Other Ambulatory Visit: Payer: Self-pay

## 2020-08-11 ENCOUNTER — Encounter (INDEPENDENT_AMBULATORY_CARE_PROVIDER_SITE_OTHER): Payer: Self-pay | Admitting: Family Medicine

## 2020-08-11 ENCOUNTER — Ambulatory Visit (INDEPENDENT_AMBULATORY_CARE_PROVIDER_SITE_OTHER): Payer: Medicare Other | Admitting: Family Medicine

## 2020-08-11 VITALS — BP 142/98 | HR 91 | Temp 98.3°F | Ht 60.0 in | Wt 253.0 lb

## 2020-08-11 DIAGNOSIS — E559 Vitamin D deficiency, unspecified: Secondary | ICD-10-CM | POA: Diagnosis not present

## 2020-08-11 DIAGNOSIS — R140 Abdominal distension (gaseous): Secondary | ICD-10-CM | POA: Diagnosis not present

## 2020-08-11 DIAGNOSIS — R609 Edema, unspecified: Secondary | ICD-10-CM | POA: Diagnosis not present

## 2020-08-11 DIAGNOSIS — Z6841 Body Mass Index (BMI) 40.0 and over, adult: Secondary | ICD-10-CM

## 2020-08-11 DIAGNOSIS — Z9189 Other specified personal risk factors, not elsewhere classified: Secondary | ICD-10-CM

## 2020-08-11 MED ORDER — VITAMIN D (ERGOCALCIFEROL) 1.25 MG (50000 UNIT) PO CAPS: 50000.0000 [IU] | ORAL_CAPSULE | ORAL | 0 refills | Status: DC

## 2020-08-15 ENCOUNTER — Ambulatory Visit: Payer: Medicare Other | Admitting: Obstetrics & Gynecology

## 2020-08-19 ENCOUNTER — Ambulatory Visit (INDEPENDENT_AMBULATORY_CARE_PROVIDER_SITE_OTHER): Payer: Medicare Other | Admitting: Otolaryngology

## 2020-08-19 NOTE — Progress Notes (Signed)
Chief Complaint:   OBESITY Gina Cameron is here to discuss her progress with her obesity treatment plan along with follow-up of her obesity related diagnoses.   Today's visit was #: 3 Starting weight: 255 lbs Starting date: 07/15/2020 Today's weight: 253 lbs Today's date: 08/11/2020 Total lbs lost to date: 2 lbs Body mass index is 49.41 kg/m.  Total weight loss percentage to date: -0.78%  Interim History:  Gina Cameron is here for a follow up office visit.  she is following the meal plan without concern or issues.  Patient's meal and food recall appears to be accurate and consistent with what is on the plan.  When on plan, her hunger and cravings are well controlled.    Sahvanna has gained 7 pounds in water since her last visit.  I counseled her on this.  For snack calories, she is having high protein granola with yogurt, a peppermint patty, and baked veggie chips.  Current Meal Plan: the Category 3 Plan for 100% of the time.  Current Exercise Plan: None.  Assessment/Plan:   No orders of the defined types were placed in this encounter.   Medications Discontinued During This Encounter  Medication Reason  . Vitamin D, Ergocalciferol, (DRISDOL) 1.25 MG (50000 UNIT) CAPS capsule Reorder     Meds ordered this encounter  Medications  . Vitamin D, Ergocalciferol, (DRISDOL) 1.25 MG (50000 UNIT) CAPS capsule    Sig: Take 1 capsule (50,000 Units total) by mouth every 7 (seven) days.    Dispense:  4 capsule    Refill:  0    Needs OV for Refills      1. Abdominal bloating No stool changes or increased constipation with starting the program.  She has needed daily fiber supplement in order to keep her regular.  No new abdominal symptoms - these are chronic in nature.  Plan:  Follow-up with her GI doctor for ongoing chronic symptoms.  No change in symptoms with following our meal plan.  2. Water retention Pt is up 7 lbs in water weight versus last OV.  Doing frozen meals for lunch  50-60% of the time.   Pt has an increased salt intake, inadequate fluid intake.    Plan:  Decrease salt intake and increase water to more than 4 bottles per day.  Take Lasix per PCPs recommendations.  Will follow closely.  3. Vitamin D eficiency Not at goal. Current vitamin D is 12.7, tested on 07/15/2020. Optimal goal > 50 ng/dL.  She is taking vitamin D 50,000 IU weekly.  Will refill today, as per below.  Plan: Continue to take prescription Vitamin D @50 ,000 IU every week as prescribed.  Follow-up for routine testing of Vitamin D, at least 2-3 times per year to avoid over-replacement.  - Refill Vitamin D, Ergocalciferol, (DRISDOL) 1.25 MG (50000 UNIT) CAPS capsule; Take 1 capsule (50,000 Units total) by mouth every 7 (seven) days.  Dispense: 4 capsule; Refill: 0  4. Class 3 severe obesity with serious comorbidity and body mass index (BMI) of 45.0 to 49.9 in adult, unspecified obesity type ([place])  Course: Infantof is currently in the action stage of change. As such, her goal is to continue with weight loss efforts.   Nutrition goals: She has agreed to the Category 3 Plan.   Exercise goals: As is.  Behavioral modification strategies: increasing lean protein intake, decreasing simple carbohydrates, increasing water intake, increasing high fiber foods and better snacking choices.  Gina Cameron has agreed to follow-up with our  clinic in 2 weeks. She was informed of the importance of frequent follow-up visits to maximize her success with intensive lifestyle modifications for her multiple health conditions.   Objective:   Blood pressure (!) 142/98, pulse 91, temperature 98.3 F (36.8 C), height 5' (1.524 m), weight 253 lb (114.8 kg), SpO2 96 %. Body mass index is 49.41 kg/m.  General: Cooperative, alert, well developed, in no acute distress. HEENT: Conjunctivae and lids unremarkable. Cardiovascular: Regular rhythm.  Lungs: Normal work of breathing. Neurologic: No focal deficits.   Lab Results   Component Value Date   CREATININE 0.64 07/15/2020   BUN 7 07/15/2020   NA 142 07/15/2020   K 4.5 07/15/2020   CL 105 07/15/2020   CO2 23 07/15/2020   Lab Results  Component Value Date   ALT 80 (H) 07/15/2020   AST 44 (H) 07/15/2020   ALKPHOS 74 07/15/2020   BILITOT 0.3 07/15/2020   Lab Results  Component Value Date   HGBA1C 6.1 (H) 07/15/2020   HGBA1C 5.5 05/10/2018   Lab Results  Component Value Date   INSULIN 16.4 07/15/2020   INSULIN 31.3 (H) 05/10/2018   Lab Results  Component Value Date   TSH 1.400 07/15/2020   Lab Results  Component Value Date   CHOL 170 07/15/2020   HDL 46 07/15/2020   LDLCALC 106 (H) 07/15/2020   TRIG 100 07/15/2020   CHOLHDL 3.7 07/15/2020   Lab Results  Component Value Date   WBC 4.9 07/15/2020   HGB 14.4 07/15/2020   HCT 43.8 07/15/2020   MCV 86 07/15/2020   PLT 420 07/15/2020   Lab Results  Component Value Date   IRON 117 01/15/2020   FERRITIN 124.1 01/15/2020   Obesity Behavioral Intervention:   Approximately 15 minutes were spent on the discussion below.  ASK: We discussed the diagnosis of obesity with [name] today and Claude agreed to give [place] permission to discuss obesity behavioral modification therapy today.  ASSESS: Taffy has the diagnosis of obesity and her BMI today is 49.5. Armentha is in the action stage of change.   ADVISE: Kellianne was educated on the multiple health risks of obesity as well as the benefit of weight loss to improve her health. She was advised of the need for long term treatment and the importance of lifestyle modifications to improve her current health and to decrease her risk of future health problems.  AGREE: Multiple dietary modification options and treatment options were discussed and Aneri agreed to follow the recommendations documented in the above note.  ARRANGE: Stefany was educated on the importance of frequent visits to treat obesity as outlined per CMS and USPSTF guidelines and agreed to  schedule her next follow up appointment today.  Attestation Statements:   Reviewed by clinician on day of visit: allergies, medications, problem list, medical history, surgical history, family history, social history, and previous encounter notes.  I, Water quality scientist, CMA, am acting as Location manager for [employer], DO.  I have reviewed the above documentation for accuracy and completeness, and I agree with the above. Marjory Sneddon, D.O.  The [place] was signed into law in 2016 which includes the topic of electronic health records.  This provides immediate access to information in MyChart.  This includes consultation notes, operative notes, office notes, lab results and pathology reports.  If you have any questions about what you read please let us know at your next visit so we can discuss your concerns and take  corrective action if need be.  We are right here with you.

## 2020-08-27 ENCOUNTER — Other Ambulatory Visit: Payer: Self-pay

## 2020-08-27 ENCOUNTER — Encounter (INDEPENDENT_AMBULATORY_CARE_PROVIDER_SITE_OTHER): Payer: Self-pay | Admitting: Family Medicine

## 2020-08-27 ENCOUNTER — Ambulatory Visit (INDEPENDENT_AMBULATORY_CARE_PROVIDER_SITE_OTHER): Payer: Medicare Other

## 2020-08-27 ENCOUNTER — Ambulatory Visit (INDEPENDENT_AMBULATORY_CARE_PROVIDER_SITE_OTHER): Payer: Medicare Other | Admitting: Family Medicine

## 2020-08-27 VITALS — BP 136/84 | HR 71 | Temp 98.0°F | Ht 60.0 in | Wt 252.0 lb

## 2020-08-27 DIAGNOSIS — E559 Vitamin D deficiency, unspecified: Secondary | ICD-10-CM | POA: Diagnosis not present

## 2020-08-27 DIAGNOSIS — J309 Allergic rhinitis, unspecified: Secondary | ICD-10-CM

## 2020-08-27 DIAGNOSIS — R609 Edema, unspecified: Secondary | ICD-10-CM | POA: Diagnosis not present

## 2020-08-27 DIAGNOSIS — Z6841 Body Mass Index (BMI) 40.0 and over, adult: Secondary | ICD-10-CM

## 2020-08-27 MED ORDER — VITAMIN D (ERGOCALCIFEROL) 1.25 MG (50000 UNIT) PO CAPS: 50000.0000 [IU] | ORAL_CAPSULE | ORAL | 0 refills | Status: AC

## 2020-09-03 ENCOUNTER — Ambulatory Visit (INDEPENDENT_AMBULATORY_CARE_PROVIDER_SITE_OTHER): Payer: Medicare Other

## 2020-09-03 ENCOUNTER — Encounter: Payer: Self-pay | Admitting: Family Medicine

## 2020-09-03 ENCOUNTER — Other Ambulatory Visit: Payer: Self-pay

## 2020-09-03 ENCOUNTER — Ambulatory Visit (INDEPENDENT_AMBULATORY_CARE_PROVIDER_SITE_OTHER): Payer: Medicare Other | Admitting: Family Medicine

## 2020-09-03 VITALS — BP 129/83 | HR 77 | Resp 16 | Ht 61.0 in | Wt 255.0 lb

## 2020-09-03 DIAGNOSIS — R102 Pelvic and perineal pain: Secondary | ICD-10-CM | POA: Diagnosis not present

## 2020-09-03 DIAGNOSIS — Z30431 Encounter for routine checking of intrauterine contraceptive device: Secondary | ICD-10-CM | POA: Diagnosis not present

## 2020-09-03 DIAGNOSIS — J309 Allergic rhinitis, unspecified: Secondary | ICD-10-CM

## 2020-09-03 DIAGNOSIS — N911 Secondary amenorrhea: Secondary | ICD-10-CM | POA: Diagnosis not present

## 2020-09-03 DIAGNOSIS — G8929 Other chronic pain: Secondary | ICD-10-CM | POA: Diagnosis not present

## 2020-09-03 MED ORDER — MELOXICAM 15 MG PO TABS: 15.0000 mg | ORAL_TABLET | Freq: Every day | ORAL | 2 refills | Status: DC

## 2020-09-03 NOTE — Assessment & Plan Note (Addendum)
She thinks is related to cycle or IUD--considering removal Trial of Mobic with food, and once daily, only with cycles.

## 2020-09-03 NOTE — Progress Notes (Signed)
   Subjective:    Patient ID: Gina Cameron is a 35 y.o. female presenting with chief complaint of pelvic pain on 09/03/2020  HPI: Here today for IUD removal. Had Liletta, which was malppositioned and replaced in 10/21. Since then, minimal cycle and she is nervous and would like Nexplanon instead.   Review of Systems  Constitutional: Negative for chills and fever.  Respiratory: Negative for shortness of breath.   Cardiovascular: Negative for chest pain.  Gastrointestinal: Negative for abdominal pain, nausea and vomiting.  Genitourinary: Negative for dysuria.  Skin: Negative for rash.      Objective:    BP 129/83   Pulse 77   Resp 16   Ht 5\' 1"  (1.549 m)   Wt 255 lb (115.7 kg)   BMI 48.18 kg/m  Physical Exam Constitutional:      General: She is not in acute distress.    Appearance: She is well-developed.  HENT:     Head: Normocephalic and atraumatic.  Eyes:     General: No scleral icterus. Cardiovascular:     Rate and Rhythm: Normal rate.  Pulmonary:     Effort: Pulmonary effort is normal.  Abdominal:     Palpations: Abdomen is soft.  Musculoskeletal:     Cervical back: Neck supple.  Skin:    General: Skin is warm and dry.  Neurological:     Mental Status: She is alert and oriented to person, place, and time.         Assessment & Plan:   Problem List Items Addressed This Visit      Unprioritized   Pelvic pain    She thinks is related to cycle or IUD--considering removal Trial of Mobic with food, and once daily, only with cycles.      Relevant Medications   meloxicam (MOBIC) 15 MG tablet   Secondary amenorrhea    Reassured this is ok with IUD in place. Patient reports allergy to lidocaine, so cannot insert Nexplanon today. I am also, concerned about bleeding profile with this medication. She agrees to keep IUD with new med for pain.       Other Visit Diagnoses    IUD check up    -  Primary   Chronic pelvic pain in female       Relevant Medications    meloxicam (MOBIC) 15 MG tablet      Total time in review of prior notes, pathology, labs, history taking, review with patient, exam, note writing, discussion of options--discussed Depo, OCPs, Nexplanon, POPs and plans for next steps to return in 3 months for f/u, alternatives and risks of treatment, on-going pain and IUD migration: 33 minutes.  Return in about 3 months (around 12/04/2020) for a follow-up.  Donnamae Jude 09/03/2020 5:15 PM

## 2020-09-03 NOTE — Assessment & Plan Note (Signed)
Reassured this is ok with IUD in place. Patient reports allergy to lidocaine, so cannot insert Nexplanon today. I am also, concerned about bleeding profile with this medication. She agrees to keep IUD with new med for pain.

## 2020-09-04 NOTE — Progress Notes (Signed)
Chief Complaint:   OBESITY Gina Cameron is here to discuss her progress with her obesity treatment plan along with follow-up of her obesity related diagnoses.   Today's visit was #: 3 Starting weight: 255 lbs Starting date: 07/15/2020 Today's weight: 252 lbs Today's date: 08/27/2020 Total lbs lost to date: 3 lbs Body mass index is 49.22 kg/m.  Total weight loss percentage to date: -1.18%  Interim History:  Gina Cameron says the portions are too big and she cannot eat them all, so she moves them to other parts of the day.  Gina Cameron is here for a follow up office visit and she is following the meal plan without concerns or issues.  Patient's meal and food recall appears to be accurate and consistent with what is on the plan.  When on plan, her hunger and cravings are well controlled.    Current Meal Plan: the Category 3 Plan for 95% of the time.  Current Exercise Plan: Treadmill for 30 minutes 1-2 times per week.  Assessment/Plan:   1. Water retention Gina Cameron noticed she was taking in a lot of salt and now has modified the items she eats to decrease her salt intake.  She drinks 80 ounces of water per day and 1-2 sparkling waters.  2. Vitamin D deficiency Not at goal. Current vitamin D is 12.7, tested on 07/15/2020. Optimal goal > 50 ng/dL.  Marland KitchenShe is taking vitamin D 50,000 IU weekly.  Plan: Continue to take prescription Vitamin D @50 ,000 IU every week as prescribed.  Follow-up for routine testing of Vitamin D, at least 2-3 times per year to avoid over-replacement.  - Refill Vitamin D, Ergocalciferol, (DRISDOL) 1.25 MG (50000 UNIT) CAPS capsule; Take 1 capsule (50,000 Units total) by mouth every 7 (seven) days.  Dispense: 4 capsule; Refill: 0  3. Class 3 severe obesity with serious comorbidity and body mass index (BMI) of 45.0 to 49.9 in adult, unspecified obesity type ([place])  Course: Gina Cameron is currently in the action stage of change. As such, her goal is to continue with weight loss  efforts.   Nutrition goals: She has agreed to the Category 3 Plan.   Exercise goals: Her goal is to use the treadmill 3 days a week or walk outside for 30 minutes.  Behavioral modification strategies: meal planning and cooking strategies and planning for success.  Gina Cameron has agreed to follow-up with our clinic in 2 weeks. She was informed of the importance of frequent follow-up visits to maximize her success with intensive lifestyle modifications for her multiple health conditions.   Objective:   Blood pressure 136/84, pulse 71, temperature 98 F (36.7 C), height 5' (1.524 m), weight 252 lb (114.3 kg), SpO2 97 %. Body mass index is 49.22 kg/m.  General: Cooperative, alert, well developed, in no acute distress. HEENT: Conjunctivae and lids unremarkable. Cardiovascular: Regular rhythm.  Lungs: Normal work of breathing. Neurologic: No focal deficits.   Lab Results  Component Value Date   CREATININE 0.64 07/15/2020   BUN 7 07/15/2020   NA 142 07/15/2020   K 4.5 07/15/2020   CL 105 07/15/2020   CO2 23 07/15/2020   Lab Results  Component Value Date   ALT 80 (H) 07/15/2020   AST 44 (H) 07/15/2020   ALKPHOS 74 07/15/2020   BILITOT 0.3 07/15/2020   Lab Results  Component Value Date   HGBA1C 6.1 (H) 07/15/2020   HGBA1C 5.5 05/10/2018   Lab Results  Component Value Date   INSULIN 16.4 07/15/2020  INSULIN 31.3 (H) 05/10/2018   Lab Results  Component Value Date   TSH 1.400 07/15/2020   Lab Results  Component Value Date   CHOL 170 07/15/2020   HDL 46 07/15/2020   LDLCALC 106 (H) 07/15/2020   TRIG 100 07/15/2020   CHOLHDL 3.7 07/15/2020   Lab Results  Component Value Date   WBC 4.9 07/15/2020   HGB 14.4 07/15/2020   HCT 43.8 07/15/2020   MCV 86 07/15/2020   PLT 420 07/15/2020   Lab Results  Component Value Date   IRON 117 01/15/2020   FERRITIN 124.1 01/15/2020   Attestation Statements:   Reviewed by clinician on day of visit: allergies, medications,  problem list, medical history, surgical history, family history, social history, and previous encounter notes.  I, Water quality scientist, CMA, am acting as Location manager for [employer], DO.  I have reviewed the above documentation for accuracy and completeness, and I agree with the above. Marjory Sneddon, D.O.  The [place] was signed into law in 2016 which includes the topic of electronic health records.  This provides immediate access to information in MyChart.  This includes consultation notes, operative notes, office notes, lab results and pathology reports.  If you have any questions about what you read please let us know at your next visit so we can discuss your concerns and take corrective action if need be.  We are right here with you. No orders of the defined types were placed in this encounter.   Medications Discontinued During This Encounter  Medication Reason  . Vitamin D, Ergocalciferol, (DRISDOL) 1.25 MG (50000 UNIT) CAPS capsule Reorder     Meds ordered this encounter  Medications  . Vitamin D, Ergocalciferol, (DRISDOL) 1.25 MG (50000 UNIT) CAPS capsule    Sig: Take 1 capsule (50,000 Units total) by mouth every 7 (seven) days.    Dispense:  4 capsule    Refill:  0    Needs OV for Refills

## 2020-09-08 ENCOUNTER — Ambulatory Visit (INDEPENDENT_AMBULATORY_CARE_PROVIDER_SITE_OTHER): Payer: Medicare Other | Admitting: Otolaryngology

## 2020-09-10 ENCOUNTER — Ambulatory Visit (INDEPENDENT_AMBULATORY_CARE_PROVIDER_SITE_OTHER): Payer: Medicaid Other | Admitting: Physician Assistant

## 2020-09-11 ENCOUNTER — Ambulatory Visit (INDEPENDENT_AMBULATORY_CARE_PROVIDER_SITE_OTHER): Payer: Medicare Other | Admitting: *Deleted

## 2020-09-11 DIAGNOSIS — J309 Allergic rhinitis, unspecified: Secondary | ICD-10-CM

## 2020-09-14 ENCOUNTER — Other Ambulatory Visit: Payer: Self-pay | Admitting: Allergy & Immunology

## 2020-09-24 ENCOUNTER — Ambulatory Visit (INDEPENDENT_AMBULATORY_CARE_PROVIDER_SITE_OTHER): Payer: Medicare Other | Admitting: Family Medicine

## 2020-09-25 ENCOUNTER — Encounter (INDEPENDENT_AMBULATORY_CARE_PROVIDER_SITE_OTHER): Payer: Self-pay

## 2020-09-28 ENCOUNTER — Other Ambulatory Visit (INDEPENDENT_AMBULATORY_CARE_PROVIDER_SITE_OTHER): Payer: Self-pay | Admitting: Family Medicine

## 2020-09-28 DIAGNOSIS — E559 Vitamin D deficiency, unspecified: Secondary | ICD-10-CM

## 2020-10-01 ENCOUNTER — Telehealth: Payer: Self-pay | Admitting: General Practice

## 2020-10-01 NOTE — Telephone Encounter (Signed)
Left message on VM for patient to give our office a call to schedule 3 month follow up appointment with Dr. Kennon Rounds for the month of June.  Mychart message sent as well.

## 2020-11-05 ENCOUNTER — Ambulatory Visit (HOSPITAL_COMMUNITY)
Admission: EM | Admit: 2020-11-05 | Discharge: 2020-11-05 | Disposition: A | Discharge status: Home / Self Care | Payer: Medicare Other | Attending: Emergency Medicine | Admitting: Emergency Medicine

## 2020-11-05 ENCOUNTER — Ambulatory Visit (INDEPENDENT_AMBULATORY_CARE_PROVIDER_SITE_OTHER): Payer: Medicare Other

## 2020-11-05 ENCOUNTER — Other Ambulatory Visit: Payer: Self-pay

## 2020-11-05 ENCOUNTER — Encounter (HOSPITAL_COMMUNITY): Payer: Self-pay

## 2020-11-05 DIAGNOSIS — W228XXA Striking against or struck by other objects, initial encounter: Secondary | ICD-10-CM

## 2020-11-05 DIAGNOSIS — R0600 Dyspnea, unspecified: Secondary | ICD-10-CM

## 2020-11-05 DIAGNOSIS — R0789 Other chest pain: Secondary | ICD-10-CM | POA: Diagnosis not present

## 2020-11-05 DIAGNOSIS — S20219A Contusion of unspecified front wall of thorax, initial encounter: Secondary | ICD-10-CM | POA: Diagnosis not present

## 2020-11-05 MED ORDER — MELOXICAM 15 MG PO TABS: 15.0000 mg | ORAL_TABLET | Freq: Every day | ORAL | 0 refills | Status: AC

## 2020-11-05 NOTE — ED Triage Notes (Signed)
Pt in  with c/o chest injury that occurred yesterday.  Pt states she was in the grocery store and went to pull down a bag in the produce section and the roll of bags fell down and hit her in the chest and took her breath away  Pt states her entire body hurt and it is hard to breathe  Pt took advil and cold compress with minimal relief

## 2020-11-05 NOTE — Discharge Instructions (Signed)
Take Meloxicam as directed.

## 2020-11-05 NOTE — ED Provider Notes (Signed)
[place]  ____________________________________________  Time seen: Approximately 6:57 PM  I have reviewed the triage vital signs and the nursing notes.   HISTORY  Chief Complaint Chest Injury   Historian Patient     HPI Gina Cameron is a 35 y.o. female presents to the urgent care with chest wall pain after patient reports that a roll of plastic bags fell on her at [employer] while shopping.  She states that she has pain when taking a deep breath.  No chest tightness.  No other alleviating measures have been attempted.   Past Medical History:  Diagnosis Date  . Anxiety   . Asthma   . Bipolar 1 disorder ([place])   . Bipolar 2 disorder ([place])   . Constipation   . Depression   . Fatty liver   . Gallbladder problem   . GERD (gastroesophageal reflux disease)   . Hives   . Joint pain   . Leg edema   . Migraine   . PTSD (post-traumatic stress disorder)   . Reflux   . Seizures ([place])   . Sleep apnea   . 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 Swelling of both lower extremities      Immunizations up to date:  Yes.     Past Medical History:  Diagnosis Date  . Anxiety   . Asthma   . Bipolar 1 disorder ([place])   . Bipolar 2 disorder ([place])   . Constipation   . Depression   . Fatty liver   . Gallbladder problem   . GERD (gastroesophageal reflux disease)   . Hives   . Joint pain   . Leg edema   . Migraine   . PTSD (post-traumatic stress disorder)   . Reflux   . Seizures ([place])   . Sleep apnea   . 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 Swelling of both lower extremities     Patient Active Problem List   Diagnosis Date Noted  . Secondary amenorrhea 09/03/2020  . Water retention 08/27/2020  . At risk for diabetes mellitus 08/03/2020  . Nonalcoholic hepatosteatosis 97/67/3419  . Lower extremity edema 07/29/2020  . Other hyperlipidemia 07/29/2020  . Vitamin D deficiency 07/29/2020  . Prediabetes 07/29/2020  . Mood disorder ([place]) 07/15/2020  . Reactive airway  disease without complication 37/90/2409  . Other fatigue 07/15/2020  . SOBOE (shortness of breath on exertion) 07/15/2020  . At risk for depression 07/15/2020  . Depression, major, single episode, mild ([place]) 07/14/2020  . Fatty liver 07/14/2020  . Seasonal and perennial allergic rhinitis 02/29/2020  . Mild persistent asthma, uncomplicated 73/53/2992  . Seasonal allergies 01/15/2020  . Pelvic pain 01/15/2020  . Abdominal bloating 01/15/2020  . Swelling 01/15/2020  . Bipolar depression ([place]) 10/17/2019  . Allergies 10/17/2019  . Class 3 severe obesity with serious comorbidity and body mass index (BMI) of 40.0 to 44.9 in adult [facility]) 10/17/2019  . Developmental delay 02/23/2018  . Seizures ([place]) 02/23/2018  . Hirsutism 02/23/2018  . Cyst of ovary 02/23/2018  . Complex tear of medial meniscus of right knee as current injury 03/01/2016  . 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 History of brain disorder 08/06/2014  . Migraine   . GERD 01/14/2010  . OBSTRUCTIVE SLEEP APNEA 10/30/2009  . Migraine without aura 06/10/2009  . ECZEMA 03/05/2009  . Depression with anxiety 07/24/2008  . MILD MENTAL RETARDATION 01/15/2008  . 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 Congenital hamartoma ([place]) 01/15/2008  . MORBID OBESITY 01/10/2008  . Flexural  atopic dermatitis 11/30/2006    Past Surgical History:  Procedure Laterality Date  . CHOLECYSTECTOMY    . OVARIAN CYST REMOVAL    . WISDOM TOOTH EXTRACTION      Prior to Admission medications   Medication Sig Start Date End Date Taking? Authorizing Provider  meloxicam (MOBIC) 15 MG tablet Take 1 tablet (15 mg total) by mouth daily for 10 days. 11/05/20 11/15/20 Yes Vallarie Mare M, PA-C  amitriptyline (ELAVIL) 10 MG tablet TAKE 1 TABLET BY MOUTH EVERY NIGHT AT BEDTIME 07/31/20   Carollee Herter, Alferd Apa, DO  cetirizine (ZYRTEC) 10 MG tablet Take 10 mg by mouth daily.    [provider]  Cetirizine HCl (ZYRTEC ALLERGY) 10 MG CAPS Take 1  capsule (10 mg total) by mouth in the morning and at bedtime. 05/29/20   Valentina Shaggy, MD  EPINEPHrine 0.3 mg/0.3 mL IJ SOAJ injection SMARTSIG:0.3 Milligram(s) IM Once 05/29/20   [provider]  FLUoxetine (PROZAC) 20 MG capsule Take 1 capsule (20 mg total) by mouth daily. 01/15/20   Ann Held, DO  furosemide (LASIX) 40 MG tablet Take 1 tablet (40 mg total) by mouth daily. 10/02/19   Ann Held, DO  ibuprofen (ADVIL) 800 MG tablet Take 800 mg by mouth every 8 (eight) hours as needed.    [provider]  montelukast (SINGULAIR) 10 MG tablet Take 1 tablet (10 mg total) by mouth at bedtime. 01/15/20   Ann Held, DO  omeprazole (PRILOSEC) 20 MG capsule TAKE 1 CAPSULE BY MOUTH EVERY DAY 08/04/20   Carollee Herter, Alferd Apa, DO  promethazine (PHENERGAN) 25 MG tablet Take 0.5-1 tablets (12.5-25 mg total) by mouth every 8 (eight) hours as needed for nausea or vomiting. 03/07/20   Zigmund Gottron, NP  VENTOLIN HFA 108 (90 Base) MCG/ACT inhaler INHALE 2 PUFFS INTO THE LUNGS EVERY 6 HOURS AS NEEDED FOR WHEEZING OR SHORTNESS OF BREATH 09/15/20   Valentina Shaggy, MD  Vitamin D, Ergocalciferol, (DRISDOL) 1.25 MG (50000 UNIT) CAPS capsule Take 1 capsule (50,000 Units total) by mouth every 7 (seven) days. 08/27/20   Opalski, Neoma Laming, DO    Allergies Lidocaine-epinephrine, Mepivacaine hcl, Diphenhydramine hcl, Molds & smuts, Other, Penicillins, and Latex  Family History  Problem Relation Age of Onset  . Hypertension Father   . Hyperlipidemia Father   . Depression Father   . Bipolar disorder Father   . Hypertension Mother   . Heart disease Mother   . Cancer Mother   . Hypertension Maternal Grandmother   . Diabetes Maternal Grandmother   . Hyperlipidemia Maternal Grandmother     Social History Social History   Tobacco Use  . Smoking status: Former Smoker    Packs/day: 0.10    Years: 2.00    Pack years: 0.20    Types: Cigarettes    Quit date:  09/06/2012    Years since quitting: 8.1  . Smokeless tobacco: Never Used  Vaping Use  . Vaping Use: Never used  Substance Use Topics  . Alcohol use: Yes    Comment: Socially  . Drug use: Yes    Types: Marijuana     Review of Systems  Constitutional: No fever/chills Eyes:  No discharge ENT: No upper respiratory complaints. Respiratory: no cough. No SOB/ use of accessory muscles to breath Gastrointestinal:   No nausea, no vomiting.  No diarrhea.  No constipation. Musculoskeletal: Patient has chest wall pain.  Skin: Negative for rash, abrasions, lacerations, ecchymosis.  ____________________________________________   PHYSICAL EXAM:  VITAL SIGNS: ED Triage Vitals  Enc Vitals Group     BP 11/05/20 1750 (!) 144/95     Pulse Rate 11/05/20 1750 89     Resp 11/05/20 1750 20     Temp 11/05/20 1750 99.3 F (37.4 C)     Temp src --      SpO2 11/05/20 1750 94 %     Weight --      Height --      Head Circumference --      Peak Flow --      Pain Score 11/05/20 1748 8     Pain Loc --      Pain Edu? --      Excl. in [place]? --      Constitutional: Alert and oriented. Well appearing and in no acute distress. Eyes: Conjunctivae are normal. PERRL. EOMI. Head: Atraumatic. ENT:      Nose: No congestion/rhinnorhea.      Mouth/Throat: Mucous membranes are moist.  Neck: No stridor.  No cervical spine tenderness to palpation. Cardiovascular: Normal rate, regular rhythm. Normal S1 and S2.  Good peripheral circulation. Respiratory: Normal respiratory effort without tachypnea or retractions. Lungs CTAB. Good air entry to the bases with no decreased or absent breath sounds Gastrointestinal: Bowel sounds x 4 quadrants. Soft and nontender to palpation. No guarding or rigidity. No distention. Musculoskeletal: Full range of motion to all extremities. No obvious deformities noted. Patient has chest wall pain to palpation.  Neurologic:  Normal for age. No gross focal neurologic deficits are  appreciated.  Skin:  Skin is warm, dry and intact. No rash noted. Psychiatric: Mood and affect are normal for age. Speech and behavior are normal.   ____________________________________________   LABS (all labs ordered are listed, but only abnormal results are displayed)  Labs Reviewed - No data to display ____________________________________________  EKG   ____________________________________________  RADIOLOGY Unk Pinto, personally viewed and evaluated these images (plain radiographs) as part of my medical decision making, as well as reviewing the written report by the radiologist.  DG Chest 2 View  Result Date: 11/05/2020 CLINICAL DATA:  Back of groceries hit patient in chest yesterday. Chest wall injury. Dyspnea. EXAM: CHEST - 2 VIEW COMPARISON:  04/08/2019 FINDINGS: The heart size and mediastinal contours are within normal limits. Both lungs are clear. No evidence of pneumothorax or hemothorax. The visualized skeletal structures are unremarkable. IMPRESSION: Negative. No active cardiopulmonary disease. Electronically Signed   By: Marlaine Hind M.D.   On: 11/05/2020 18:48    ____________________________________________    PROCEDURES  Procedure(s) performed:     Procedures     Medications - No data to display   ____________________________________________   INITIAL IMPRESSION / ASSESSMENT AND PLAN / ED COURSE  Pertinent labs & imaging results that were available during my care of the patient were reviewed by me and considered in my medical decision making (see chart for details).      Assessment and plan Chest wall pain 35 year old female presents to the emergency department with anterior chest wall pain after contusion type injury.  No evidence of pneumothorax or rib fracture on dedicated chest x-ray.  Patient was discharged with meloxicam for pain and inflammation.     ____________________________________________  FINAL CLINICAL IMPRESSION(S) /  ED DIAGNOSES  Final diagnoses:  Contusion of chest wall, unspecified laterality, initial encounter      NEW MEDICATIONS STARTED DURING THIS VISIT:  ED Discharge Orders  Ordered    meloxicam (MOBIC) 15 MG tablet  Daily        11/05/20 1852              This chart was dictated using voice recognition software/Dragon. Despite best efforts to proofread, errors can occur which can change the meaning. Any change was purely unintentional.     Lannie Fields, PA-C 11/05/20 1900

## 2020-11-25 ENCOUNTER — Ambulatory Visit: Payer: Medicare Other | Admitting: Allergy & Immunology

## 2020-12-08 ENCOUNTER — Other Ambulatory Visit: Payer: Self-pay | Admitting: Family Medicine

## 2020-12-08 DIAGNOSIS — R102 Pelvic and perineal pain: Secondary | ICD-10-CM

## 2020-12-19 ENCOUNTER — Other Ambulatory Visit: Payer: Self-pay | Admitting: *Deleted

## 2020-12-19 DIAGNOSIS — K219 Gastro-esophageal reflux disease without esophagitis: Secondary | ICD-10-CM

## 2021-01-02 ENCOUNTER — Other Ambulatory Visit: Payer: Self-pay | Admitting: Allergy & Immunology

## 2021-01-02 NOTE — Telephone Encounter (Signed)
Patient was last seen December of 2021. She no showed to her appointment in June of 2022 and we sent in albuterol inhaler before her June appointment.

## 2021-03-05 ENCOUNTER — Other Ambulatory Visit: Payer: Self-pay

## 2021-03-05 ENCOUNTER — Ambulatory Visit (HOSPITAL_COMMUNITY)
Admission: EM | Admit: 2021-03-05 | Discharge: 2021-03-05 | Disposition: A | Discharge status: Home / Self Care | Payer: Medicare Other

## 2021-03-05 ENCOUNTER — Encounter (HOSPITAL_COMMUNITY): Payer: Self-pay

## 2021-03-05 DIAGNOSIS — G43711 Chronic migraine without aura, intractable, with status migrainosus: Secondary | ICD-10-CM

## 2021-03-05 DIAGNOSIS — R110 Nausea: Secondary | ICD-10-CM | POA: Diagnosis not present

## 2021-03-05 MED ORDER — ONDANSETRON 4 MG PO TBDP
ORAL_TABLET | ORAL | Status: AC
  Filled 2021-03-05: qty 1

## 2021-03-05 MED ORDER — ONDANSETRON 4 MG PO TBDP: 4.0000 mg | ORAL_TABLET | Freq: Three times a day (TID) | ORAL | 0 refills | Status: AC | PRN

## 2021-03-05 MED ORDER — BUTALBITAL-APAP-CAFFEINE 50-325-40 MG PO TABS: 1.0000 | ORAL_TABLET | Freq: Two times a day (BID) | ORAL | 0 refills | Status: AC | PRN

## 2021-03-05 MED ORDER — KETOROLAC TROMETHAMINE 60 MG/2ML IM SOLN
INTRAMUSCULAR | Status: AC
  Filled 2021-03-05: qty 2

## 2021-03-05 MED ORDER — ONDANSETRON 4 MG PO TBDP
4.0000 mg | ORAL_TABLET | Freq: Once | ORAL | Status: AC
  Administered 2021-03-05: 4 mg via ORAL

## 2021-03-05 MED ORDER — KETOROLAC TROMETHAMINE 60 MG/2ML IM SOLN
60.0000 mg | Freq: Once | INTRAMUSCULAR | Status: AC
  Administered 2021-03-05: 60 mg via INTRAMUSCULAR

## 2021-03-05 NOTE — ED Triage Notes (Signed)
Patient presents to Urgent Care with complaints of migraine and nausea x 2 days ago. Pt states she has a hx of chronic migraines. Pt states she is out of her migraine medications. Treating symptoms with Excedrin and ibuprofen with some relief.

## 2021-03-05 NOTE — ED Provider Notes (Signed)
[place]    CSN: [id number] Arrival date & time: 03/05/21  [id number]      History   Chief Complaint Chief Complaint  Patient presents with   Migraine   Nausea    HPI Gina Cameron is a 35 y.o. female.   Patient presents today with a 3-day history of chronic migraine that is unresponsive to over-the-counter medications.  Patient has a history of migraines reports current migraine is identical to previous episodes of this condition.  She reports pain is rated 10 on a 0-10 pain scale, localized to frontal region, described as throbbing, worse with activity/light/sound, no alleviating factors identified.  She has tried Excedrin without improvement of symptoms.  She has previously required Toradol and other injectable medications to break headache cycle.  She has not taken any medication today.  She has no concern for pregnancy.  She has seen a neurologist in the past but they stopped taking her insurance that she has not seen 1 recently.  She denies any recent head injury or medication changes prior to symptom onset.  Denies any recent illness or additional symptoms including fever, cough, congestion, chest pain, shortness of breath.  She denies any focal weakness, vision changes, dysarthria.  She does report nausea with 2 episodes of emesis.  This is not the worst headache of her life according to patient.   Past Medical History:  Diagnosis Date   Anxiety    Asthma    Bipolar 1 disorder ([place])    Bipolar 2 disorder ([place])    Constipation    Depression    Fatty liver    Gallbladder problem    GERD (gastroesophageal reflux disease)    Hives    Joint pain    Leg edema    Migraine    PTSD (post-traumatic stress disorder)    Reflux    Seizures (HCC)    Sleep apnea    Sturge-Weber syndrome (HCC)    Swelling of both lower extremities     Patient Active Problem List   Diagnosis Date Noted   Secondary amenorrhea 09/03/2020   Water retention 08/27/2020   At risk for  diabetes mellitus [id number]   Nonalcoholic hepatosteatosis [id number]   Lower extremity edema 07/29/2020   Other hyperlipidemia 07/29/2020   Vitamin D deficiency 07/29/2020   Prediabetes 07/29/2020   Mood disorder ([place]) 07/15/2020   Reactive airway disease without complication [id number]   Other fatigue 07/15/2020   SOBOE (shortness of breath on exertion) 07/15/2020   At risk for depression 07/15/2020   Depression, major, single episode, mild ([place]) 07/14/2020   Fatty liver 07/14/2020   Seasonal and perennial allergic rhinitis 02/29/2020   Mild persistent asthma, uncomplicated [id number]   Seasonal allergies 01/15/2020   Pelvic pain 01/15/2020   Abdominal bloating 01/15/2020   Swelling 01/15/2020   Bipolar depression ([place]) 10/17/2019   Allergies 10/17/2019   Class 3 severe obesity with serious comorbidity and body mass index (BMI) of 40.0 to 44.9 in adult ([place]) 10/17/2019   Developmental delay 02/23/2018   Seizures ([place]) 02/23/2018   Hirsutism 02/23/2018   Cyst of ovary 02/23/2018   Complex tear of medial meniscus of right knee as current injury 03/01/2016   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   History of brain disorder 08/06/2014   Migraine    GERD 01/14/2010   OBSTRUCTIVE SLEEP APNEA 10/30/2009   Migraine without aura 06/10/2009   ECZEMA 03/05/2009   Depression with anxiety 07/24/2008   MILD MENTAL RETARDATION 01/15/2008   Sturge-Weber syndrome (  [place]) 01/15/2008   Congenital hamartoma ([place]) 01/15/2008   MORBID OBESITY 01/10/2008   Flexural atopic dermatitis 11/30/2006    Past Surgical History:  Procedure Laterality Date   CHOLECYSTECTOMY     OVARIAN CYST REMOVAL     WISDOM TOOTH EXTRACTION      OB History     Gravida  0   Para  0   Term  0   Preterm  0   AB  0   Living  0      SAB  0   IAB  0   Ectopic  0   Multiple  0   Live Births  0            Home Medications    Prior to Admission medications   Medication Sig Start Date  End Date Taking? Authorizing Provider  butalbital-acetaminophen-caffeine (FIORICET) 50-325-40 MG tablet Take 1 tablet by mouth 2 (two) times daily as needed for up to 1 day for headache. 03/05/21 03/06/21 Yes Leticia Coletta, Derry Skill, PA-C  FLUoxetine (PROZAC) 20 MG capsule Take by mouth. 01/08/21  Yes [provider]  levocetirizine (XYZAL) 5 MG tablet Take by mouth. 01/08/21  Yes [provider]  ondansetron (ZOFRAN ODT) 4 MG disintegrating tablet Take 1 tablet (4 mg total) by mouth every 8 (eight) hours as needed for nausea or vomiting. 03/05/21  Yes Dorris Vangorder K, PA-C  amitriptyline (ELAVIL) 10 MG tablet TAKE 1 TABLET BY MOUTH EVERY NIGHT AT BEDTIME 07/31/20   Carollee Herter, Yvonne R, DO  cetirizine (ZYRTEC) 10 MG tablet Take 10 mg by mouth daily.    [provider]  Cetirizine HCl (ZYRTEC ALLERGY) 10 MG CAPS Take 1 capsule (10 mg total) by mouth in the morning and at bedtime. 05/29/20   Valentina Shaggy, MD  EPINEPHrine 0.3 mg/0.3 mL IJ SOAJ injection SMARTSIG:0.3 Milligram(s) IM Once 05/29/20   [provider]  FLUoxetine (PROZAC) 20 MG capsule Take 1 capsule (20 mg total) by mouth daily. 01/15/20   Ann Held, DO  furosemide (LASIX) 40 MG tablet Take 1 tablet (40 mg total) by mouth daily. 10/02/19   Ann Held, DO  hydrochlorothiazide (HYDRODIURIL) 25 MG tablet Take 25 mg by mouth daily. 01/21/21   [provider]  ibuprofen (ADVIL) 800 MG tablet Take 800 mg by mouth every 8 (eight) hours as needed.    [provider]  meloxicam (MOBIC) 15 MG tablet TAKE 1 TABLET(15 MG) BY MOUTH DAILY 12/08/20   Donnamae Jude, MD  metFORMIN (GLUCOPHAGE) 500 MG tablet Take 500 mg by mouth 2 (two) times daily. 12/29/20   [provider]  montelukast (SINGULAIR) 10 MG tablet Take 1 tablet (10 mg total) by mouth at bedtime. 01/15/20   Ann Held, DO  omeprazole (PRILOSEC) 20 MG capsule TAKE 1 CAPSULE BY MOUTH EVERY DAY 08/04/20   Carollee Herter, Alferd Apa, DO  promethazine (PHENERGAN) 25 MG tablet Take 0.5-1 tablets (12.5-25 mg total) by mouth every 8 (eight) hours as needed for nausea or vomiting. 03/07/20   Zigmund Gottron, NP  VENTOLIN HFA 108 (90 Base) MCG/ACT inhaler INHALE 2 PUFFS INTO THE LUNGS EVERY 6 HOURS AS NEEDED FOR WHEEZING OR SHORTNESS OF BREATH 09/15/20   Valentina Shaggy, MD  Vitamin D, Ergocalciferol, (DRISDOL) 1.25 MG (50000 UNIT) CAPS capsule Take 1 capsule (50,000 Units total) by mouth every 7 (seven) days. 08/27/20   Mellody Dance, DO    Family History Family  History  Problem Relation Age of Onset   Hypertension Father    Hyperlipidemia Father    Depression Father    Bipolar disorder Father    Hypertension Mother    Heart disease Mother    Cancer Mother    Hypertension Maternal Grandmother    Diabetes Maternal Grandmother    Hyperlipidemia Maternal Grandmother     Social History Social History   Tobacco Use   Smoking status: Former    Packs/day: 0.10    Years: 2.00    Pack years: 0.20    Types: Cigarettes    Quit date: 09/06/2012    Years since quitting: 8.4   Smokeless tobacco: Never  Vaping Use   Vaping Use: Never used  Substance Use Topics   Alcohol use: Yes    Comment: Socially   Drug use: Yes    Types: Marijuana     Allergies   Diphenhydramine-zinc acetate, Lidocaine-epinephrine, Mepivacaine hcl, Diphenhydramine hcl, Grass extracts [gramineae pollens], Molds & smuts, Other, Penicillins, and Latex   Review of Systems Review of Systems  Constitutional:  Positive for activity change. Negative for appetite change, fatigue and fever.  HENT:  Negative for congestion, sinus pressure, sneezing and sore throat.   Eyes:  Positive for photophobia. Negative for visual disturbance.  Respiratory:  Negative for cough and shortness of breath.   Gastrointestinal:  Positive for nausea and vomiting. Negative for abdominal pain and diarrhea.  Musculoskeletal:  Negative for arthralgias  and myalgias.  Neurological:  Positive for headaches. Negative for dizziness, syncope, facial asymmetry, speech difficulty, weakness, light-headedness and numbness.    Physical Exam Triage Vital Signs ED Triage Vitals  Enc Vitals Group     BP 03/05/21 1150 123/84     Pulse Rate 03/05/21 1150 79     Resp 03/05/21 1150 16     Temp 03/05/21 1150 99.1 F (37.3 C)     Temp Source 03/05/21 1150 Oral     SpO2 03/05/21 1150 95 %     Weight --      Height --      Head Circumference --      Peak Flow --      Pain Score 03/05/21 1147 10     Pain Loc --      Pain Edu? --      Excl. in [place]? --    No data found.  Updated Vital Signs BP 123/84 (BP Location: Right Arm)   Pulse 79   Temp 99.1 F (37.3 C) (Oral)   Resp 16   SpO2 95%   Visual Acuity Right Eye Distance:   Left Eye Distance:   Bilateral Distance:    Right Eye Near:   Left Eye Near:    Bilateral Near:     Physical Exam Vitals reviewed.  Constitutional:      General: She is awake. She is not in acute distress.    Appearance: Normal appearance. She is well-developed. She is not ill-appearing.     Comments: Very pleasant female present age sitting in darkened room in no acute distress  HENT:     Head: Normocephalic and atraumatic. No raccoon eyes, Battle's sign or contusion.     Right Ear: Tympanic membrane, ear canal and external ear normal. No hemotympanum.     Left Ear: Tympanic membrane, ear canal and external ear normal. No hemotympanum.     Mouth/Throat:     Tongue: Tongue does not deviate from midline.     Pharynx: Uvula midline.  No oropharyngeal exudate or posterior oropharyngeal erythema.  Eyes:     Extraocular Movements: Extraocular movements intact.     Pupils: Pupils are equal, round, and reactive to light.  Cardiovascular:     Rate and Rhythm: Normal rate and regular rhythm.     Heart sounds: Normal heart sounds, S1 normal and S2 normal. No murmur heard. Pulmonary:     Effort: Pulmonary effort is  normal.     Breath sounds: Normal breath sounds. No wheezing, rhonchi or rales.  Musculoskeletal:     Cervical back: Normal range of motion and neck supple. No spinous process tenderness or muscular tenderness.     Comments: Strength 5/5 bilateral upper and lower extremities  Lymphadenopathy:     Head:     Right side of head: No submental, submandibular or tonsillar adenopathy.     Left side of head: No submental, submandibular or tonsillar adenopathy.  Neurological:     General: No focal deficit present.     Cranial Nerves: Cranial nerves are intact.     Motor: Motor function is intact.     Coordination: Coordination is intact. Romberg sign negative.     Gait: Gait is intact.     Comments: Cranial nerves II through XII intact.  No focal neurological defect on exam.  Psychiatric:        Behavior: Behavior is cooperative.     UC Treatments / Results  Labs (all labs ordered are listed, but only abnormal results are displayed) Labs Reviewed - No data to display  EKG   Radiology No results found.  Procedures Procedures (including critical care time)  Medications Ordered in UC Medications  ketorolac (TORADOL) injection 60 mg (60 mg Intramuscular Given 03/05/21 1223)  ondansetron (ZOFRAN-ODT) disintegrating tablet 4 mg (4 mg Oral Given 03/05/21 1223)    Initial Impression / Assessment and Plan / UC Course  I have reviewed the triage vital signs and the nursing notes.  Pertinent labs & imaging results that were available during my care of the patient were reviewed by me and considered in my medical decision making (see chart for details).      Vital signs and physical exam reassuring today; no indication for emergent evaluation or imaging.  No SNOOP symptoms that would warrant emergent evaluation.  Patient was given Toradol 60 mg and Zofran in clinic with improvement but not resolution of symptoms.  Reports that after medications her nausea resolved and she had lessened  headache pain but it did not resolve.  She was given prescription for Zofran to be used as needed for nausea/vomiting symptoms as well as a prescription for 2 doses of Fioricet to break headache cycle.  Discussed that if she has persistent headache symptoms despite this course of medication or if at any point she develops worsening symptoms including weakness, dysarthria, vision changes she needs to go to the emergency room.  Encouraged her to follow-up with her primary care provider to consider referral to neurologist given ongoing symptoms.  Strict return precautions given to which she expressed understanding.  Final Clinical Impressions(s) / UC Diagnoses   Final diagnoses:  Intractable chronic migraine without aura and with status migrainosus  Nausea     Discharge Instructions      We gave you Toradol today so please do not take additional NSAIDs for 24 hours which includes ibuprofen, aspirin, naproxen/Aleve.  You can use Tylenol for breakthrough pain.  You can use Zofran up to 3 times a day as needed for nausea/vomiting  symptoms.  I called in 2 doses of Fioricet to help with your symptoms.  This is a controlled substance so please be careful using this and do not drive or drink alcohol until you know how will affect you.  I think it is very important that you follow-up with a neurologist given your symptoms so please follow-up with your primary care provider to consider referral to a new neurologist.  If you have any worsening symptoms including persistent/worsening headache, nausea/vomiting interfering with oral intake, vision changes, difficulty speaking, weakness you need to go to the hospital as we discussed.     ED Prescriptions     Medication Sig Dispense Auth. Provider   ondansetron (ZOFRAN ODT) 4 MG disintegrating tablet Take 1 tablet (4 mg total) by mouth every 8 (eight) hours as needed for nausea or vomiting. 20 tablet Jaze Rodino K, PA-C   butalbital-acetaminophen-caffeine  (FIORICET) 50-325-40 MG tablet Take 1 tablet by mouth 2 (two) times daily as needed for up to 1 day for headache. 2 tablet Loyd Salvador, Derry Skill, PA-C      PDMP not reviewed this encounter.   Terrilee Croak, PA-C 03/05/21 1305

## 2021-03-05 NOTE — Discharge Instructions (Addendum)
We gave you Toradol today so please do not take additional NSAIDs for 24 hours which includes ibuprofen, aspirin, naproxen/Aleve.  You can use Tylenol for breakthrough pain.  You can use Zofran up to 3 times a day as needed for nausea/vomiting symptoms.  I called in 2 doses of Fioricet to help with your symptoms.  This is a controlled substance so please be careful using this and do not drive or drink alcohol until you know how will affect you.  I think it is very important that you follow-up with a neurologist given your symptoms so please follow-up with your primary care provider to consider referral to a new neurologist.  If you have any worsening symptoms including persistent/worsening headache, nausea/vomiting interfering with oral intake, vision changes, difficulty speaking, weakness you need to go to the hospital as we discussed.

## 2021-05-08 ENCOUNTER — Encounter (HOSPITAL_COMMUNITY): Payer: Self-pay

## 2021-05-08 ENCOUNTER — Ambulatory Visit (HOSPITAL_COMMUNITY)
Admission: EM | Admit: 2021-05-08 | Discharge: 2021-05-08 | Disposition: A | Discharge status: Home / Self Care | Payer: Medicare Other | Attending: Nurse Practitioner | Admitting: Nurse Practitioner

## 2021-05-08 ENCOUNTER — Other Ambulatory Visit: Payer: Self-pay

## 2021-05-08 DIAGNOSIS — J111 Influenza due to unidentified influenza virus with other respiratory manifestations: Secondary | ICD-10-CM | POA: Insufficient documentation

## 2021-05-08 DIAGNOSIS — Z1152 Encounter for screening for COVID-19: Secondary | ICD-10-CM | POA: Insufficient documentation

## 2021-05-08 LAB — POC INFLUENZA A AND B ANTIGEN (URGENT CARE ONLY)
INFLUENZA A ANTIGEN, POC: NEGATIVE
INFLUENZA B ANTIGEN, POC: NEGATIVE

## 2021-05-08 MED ORDER — OSELTAMIVIR PHOSPHATE 75 MG PO CAPS: 75.0000 mg | ORAL_CAPSULE | Freq: Two times a day (BID) | ORAL | 0 refills | Status: DC

## 2021-05-08 NOTE — ED Triage Notes (Signed)
Pt states she has been exposed to [place] and the flu recently. Pt c/o sore throat, runny nose, cough, body aches, fever and headache. Pt states has vomiting and diarrhea. Sx started 4 days ago.

## 2021-05-08 NOTE — Discharge Instructions (Signed)
Your flu test was negative. You may take tylenol or ibuprofen as needed for fevers/headache/body aches. Drink plenty of fluids. Stay in home isolation until you receive results of your COVID test. You will only be notified for positive results. You may go online to MyChart to review your results.

## 2021-05-08 NOTE — ED Provider Notes (Signed)
[place]    CSN: [id number] Arrival date & time: 05/08/21  0017      History   Chief Complaint Chief Complaint  Patient presents with   Influenza    HPI Gina Cameron is a 35 y.o. female.   Subjective:   Gina Cameron is a 35 y.o. female who presents for evaluation of influenza like symptoms. Symptoms include chills, hot and cold spells, chest congestion, headache, myalgias, productive cough, sore throat, and runny nose. Symptoms have been present for 4 days. She has tried to alleviate the symptoms with tylenol cold/sinus, mucinex maximum strength with intermittent relief and supportive measures such as warm salt gargles, increased fluids and resting. High risk factors for influenza complications: co-morbid illness.  She has had known exposure to COVID and FLU. Last exposure was about 4 days ago.   Patient has been vaccinated against COVID with 2 boosters. She has also had seasonal flu vaccine.   The following portions of the patient's history were reviewed and updated as appropriate: allergies, current medications, past family history, past medical history, past social history, past surgical history, and problem list.    Past Medical History:  Diagnosis Date   Anxiety    Asthma    Bipolar 1 disorder ([place])    Bipolar 2 disorder ([place])    Constipation    Depression    Fatty liver    Gallbladder problem    GERD (gastroesophageal reflux disease)    Hives    Joint pain    Leg edema    Migraine    PTSD (post-traumatic stress disorder)    Reflux    Seizures ([place])    Sleep apnea    Sturge-Weber syndrome    Swelling of both lower extremities     Patient Active Problem List   Diagnosis Date Noted   Secondary amenorrhea 09/03/2020   Water retention 08/27/2020   At risk for diabetes mellitus 49/44/9675   Nonalcoholic hepatosteatosis 91/63/8466   Lower extremity edema 07/29/2020   Other hyperlipidemia 07/29/2020   Vitamin D deficiency 07/29/2020    Prediabetes 07/29/2020   Mood disorder ([place]) 07/15/2020   Reactive airway disease without complication 59/93/5701   Other fatigue 07/15/2020   SOBOE (shortness of breath on exertion) 07/15/2020   At risk for depression 07/15/2020   Depression, major, single episode, mild ([place]) 07/14/2020   Fatty liver 07/14/2020   Seasonal and perennial allergic rhinitis 02/29/2020   Mild persistent asthma, uncomplicated 77/93/9030   Seasonal allergies 01/15/2020   Pelvic pain 01/15/2020   Abdominal bloating 01/15/2020   Swelling 01/15/2020   Bipolar depression ([place]) 10/17/2019   Allergies 10/17/2019   Class 3 severe obesity with serious comorbidity and body mass index (BMI) of 40.0 to 44.9 in adult ([place]) 10/17/2019   Developmental delay 02/23/2018   Seizures ([place]) 02/23/2018   Hirsutism 02/23/2018   Cyst of ovary 02/23/2018   Complex tear of medial meniscus of right knee as current injury 03/01/2016   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   History of brain disorder 08/06/2014   Migraine    GERD 01/14/2010   OBSTRUCTIVE SLEEP APNEA 10/30/2009   Migraine without aura 06/10/2009   ECZEMA 03/05/2009   Depression with anxiety 07/24/2008   MILD MENTAL RETARDATION 01/15/2008   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 Authorizing Provider  amitriptyline (ELAVIL) 10 MG tablet TAKE 1 TABLET BY MOUTH EVERY NIGHT AT BEDTIME 07/31/20  Yes Roma Schanz R, DO  Cetirizine HCl  (ZYRTEC ALLERGY) 10 MG CAPS Take 1 capsule (10 mg total) by mouth in the morning and at bedtime. 05/29/20  Yes Valentina Shaggy, MD  FLUoxetine (PROZAC) 20 MG capsule Take by mouth. 01/08/21  Yes [provider]  furosemide (LASIX) 40 MG tablet Take 1 tablet (40 mg total) by mouth daily. 10/02/19  Yes Roma Schanz R, DO  hydrochlorothiazide (HYDRODIURIL) 25 MG tablet Take 25 mg by mouth daily. 01/21/21  Yes [provider]  levocetirizine (XYZAL) 5 MG tablet Take by mouth. 01/08/21  Yes [provider]  metFORMIN (GLUCOPHAGE) 500 MG tablet Take 500 mg by mouth 2 (two) times daily. 12/29/20  Yes [provider]  montelukast (SINGULAIR) 10 MG tablet Take 1 tablet (10 mg total) by mouth at bedtime. 01/15/20  Yes Roma Schanz R, DO  omeprazole (PRILOSEC) 20 MG capsule TAKE 1 CAPSULE BY MOUTH EVERY DAY 08/04/20  Yes Lowne Chase, Yvonne R, DO  VENTOLIN HFA 108 (90 Base) MCG/ACT inhaler INHALE 2 PUFFS INTO THE LUNGS EVERY 6 HOURS AS NEEDED FOR WHEEZING OR SHORTNESS OF BREATH 09/15/20  Yes Valentina Shaggy, MD  Vitamin D, Ergocalciferol, (DRISDOL) 1.25 MG (50000 UNIT) CAPS capsule Take 1 capsule (50,000 Units total) by mouth every 7 (seven) days. 08/27/20  Yes Opalski, Neoma Laming, DO  EPINEPHrine 0.3 mg/0.3 mL IJ SOAJ injection SMARTSIG:0.3 Milligram(s) IM Once 05/29/20   [provider]  FLUoxetine (PROZAC) 20 MG capsule Take 1 capsule (20 mg total) by mouth daily. 01/15/20   Ann Held, DO  ondansetron (ZOFRAN ODT) 4 MG disintegrating tablet Take 1 tablet (4 mg total) by mouth every 8 (eight) hours as needed for nausea or vomiting. 03/05/21   Raspet, Derry Skill, PA-C  promethazine (PHENERGAN) 25 MG tablet Take 0.5-1 tablets (12.5-25 mg total) by mouth every 8 (eight) hours as needed for nausea or vomiting. 03/07/20   Zigmund Gottron, NP    Family History Family History  Problem Relation Age of Onset   Hypertension Father    Hyperlipidemia Father     Depression Father    Bipolar disorder Father    Hypertension Mother    Heart disease Mother    Cancer Mother    Hypertension Maternal Grandmother    Diabetes Maternal Grandmother    Hyperlipidemia Maternal Grandmother     Social History Social History   Tobacco Use   Smoking status: Former    Packs/day: 0.10    Years: 2.00    Pack years: 0.20    Types: Cigarettes    Quit date: 09/06/2012    Years since quitting: 8.6   Smokeless tobacco: Never  Vaping Use   Vaping Use: Never used  Substance Use Topics  Alcohol use: Yes    Comment: Socially   Drug use: Not Currently    Types: Marijuana     Allergies   Diphenhydramine-zinc acetate, Lidocaine-epinephrine, Mepivacaine hcl, Diphenhydramine hcl, Grass extracts [gramineae pollens], Molds & smuts, Other, Penicillins, and Latex   Review of Systems Review of Systems  Constitutional:  Positive for activity change, chills and fever.  HENT:  Positive for congestion, rhinorrhea and sore throat.   Respiratory:  Positive for cough.   Gastrointestinal:  Negative for diarrhea, nausea and vomiting.  Musculoskeletal:  Positive for myalgias.  Neurological:  Positive for headaches.  All other systems reviewed and are negative.   Physical Exam Triage Vital Signs ED Triage Vitals  Enc Vitals Group     BP 05/08/21 1115 125/87     Pulse Rate 05/08/21 1115 89     Resp 05/08/21 1115 20     Temp 05/08/21 1115 98.6 F (37 C)     Temp Source 05/08/21 1115 Oral     SpO2 05/08/21 1115 100 %     Weight --      Height --      Head Circumference --      Peak Flow --      Pain Score 05/08/21 1118 9     Pain Loc --      Pain Edu? --      Excl. in [place]? --    No data found.  Updated Vital Signs BP 125/87 (BP Location: Right Arm)   Pulse 89   Temp 98.6 F (37 C) (Oral)   Resp 20   LMP  (LMP Unknown)   SpO2 100%   Visual Acuity Right Eye Distance:   Left Eye Distance:   Bilateral Distance:    Right Eye Near:   Left Eye  Near:    Bilateral Near:     Physical Exam Vitals reviewed.  Constitutional:      General: She is not in acute distress.    Appearance: Normal appearance. She is ill-appearing. She is not toxic-appearing.  HENT:     Head: Normocephalic.     Nose: Rhinorrhea present.     Mouth/Throat:     Mouth: Mucous membranes are moist.  Eyes:     Conjunctiva/sclera: Conjunctivae normal.  Cardiovascular:     Rate and Rhythm: Normal rate.  Pulmonary:     Effort: Pulmonary effort is normal.  Abdominal:     Palpations: Abdomen is soft.  Musculoskeletal:        General: Normal range of motion.     Cervical back: Normal range of motion and neck supple.  Skin:    General: Skin is warm and dry.  Neurological:     General: No focal deficit present.     Mental Status: She is alert and oriented to person, place, and time.  Psychiatric:        Mood and Affect: Mood normal.        Behavior: Behavior normal.     UC Treatments / Results  Labs (all labs ordered are listed, but only abnormal results are displayed) Labs Reviewed  SARS CORONAVIRUS 2 (TAT 6-24 HRS)  POC INFLUENZA A AND B ANTIGEN (URGENT CARE ONLY)    EKG   Radiology No results found.  Procedures Procedures (including critical care time)  Medications Ordered in UC Medications - No data to display  Initial Impression / Assessment and Plan / UC Course  I have reviewed the triage vital signs and the nursing notes.  Pertinent labs & imaging results that were available during my care of the patient were reviewed by me and considered in my medical decision making (see chart for details).     35 yo female presenting with influenza-like symptoms for the past 4 days. She has had known exposure to both Flu and COVID as well. She is AAOx3. Afebrile. She appears acutely-ill but nontoxic. VSS. Flu negative. COVID pending. Supportive care with appropriate antipyretics and fluids.   Today's evaluation has revealed no signs of a  dangerous process. Discussed diagnosis with patient and/or guardian. Patient and/or guardian aware of their diagnosis, possible red flag symptoms to watch out for and need for close follow up. Patient and/or guardian understands verbal and written discharge instructions. Patient and/or guardian comfortable with plan and disposition.  Patient and/or guardian has a clear mental status at this time, good insight into illness (after discussion and teaching) and has clear judgment to make decisions regarding their care  This care was provided during an unprecedented National Emergency due to the Novel Coronavirus (COVID-19) pandemic. COVID-19 infections and transmission risks place heavy strains on healthcare resources.  As this pandemic evolves, our facility, providers, and staff strive to respond fluidly, to remain operational, and to provide care relative to available resources and information. Outcomes are unpredictable and treatments are without well-defined guidelines. Further, the impact of COVID-19 on all aspects of urgent care, including the impact to patients seeking care for reasons other than COVID-19, is unavoidable during this national emergency. At this time of the global pandemic, management of patients has significantly changed, even for non-COVID positive patients given high local and regional COVID volumes at this time requiring high healthcare system and resource utilization. The standard of care for management of both COVID suspected and non-COVID suspected patients continues to change rapidly at the local, regional, national, and global levels. This patient was worked up and treated to the best available but ever changing evidence and resources available at this current time.   Documentation was completed with the aid of voice recognition software. Transcription may contain typographical errors.  Final Clinical Impressions(s) / UC Diagnoses   Final diagnoses:  Influenza-like illness   Encounter for screening for COVID-19     Discharge Instructions      Your flu test was negative. You may take tylenol or ibuprofen as needed for fevers/headache/body aches. Drink plenty of fluids. Stay in home isolation until you receive results of your COVID test. You will only be notified for positive results. You may go online to MyChart to review your results.       ED Prescriptions     Medication Sig Dispense Auth. Provider   oseltamivir (TAMIFLU) 75 MG capsule  (Status: Discontinued) Take 1 capsule (75 mg total) by mouth every 12 (twelve) hours. 10 capsule Enrique Sack, FNP      PDMP not reviewed this encounter.   Enrique Sack, [place] 05/08/21 1353

## 2021-05-09 LAB — SARS CORONAVIRUS 2 (TAT 6-24 HRS): SARS Coronavirus 2: NEGATIVE

## 2022-01-27 ENCOUNTER — Encounter (INDEPENDENT_AMBULATORY_CARE_PROVIDER_SITE_OTHER): Payer: Self-pay

## 2022-02-09 ENCOUNTER — Ambulatory Visit (HOSPITAL_COMMUNITY)
Admission: EM | Admit: 2022-02-09 | Discharge: 2022-02-09 | Disposition: A | Discharge status: Home / Self Care | Payer: Medicare Other | Attending: Physician Assistant | Admitting: Physician Assistant

## 2022-02-09 ENCOUNTER — Encounter (HOSPITAL_COMMUNITY): Payer: Self-pay | Admitting: Emergency Medicine

## 2022-02-09 DIAGNOSIS — N939 Abnormal uterine and vaginal bleeding, unspecified: Secondary | ICD-10-CM | POA: Insufficient documentation

## 2022-02-09 DIAGNOSIS — N760 Acute vaginitis: Secondary | ICD-10-CM | POA: Diagnosis present

## 2022-02-09 LAB — POCT URINALYSIS DIPSTICK, ED / UC
Bilirubin Urine: NEGATIVE
Glucose, UA: NEGATIVE mg/dL
Hgb urine dipstick: NEGATIVE
Ketones, ur: NEGATIVE mg/dL
Nitrite: NEGATIVE
Protein, ur: NEGATIVE mg/dL
Specific Gravity, Urine: 1.02 (ref 1.005–1.030)
Urobilinogen, UA: 0.2 mg/dL (ref 0.0–1.0)
pH: 8.5 — ABNORMAL HIGH (ref 5.0–8.0)

## 2022-02-09 LAB — POC URINE PREG, ED: Preg Test, Ur: NEGATIVE

## 2022-02-09 MED ORDER — FLUCONAZOLE 150 MG PO TABS: 150.0000 mg | ORAL_TABLET | ORAL | 0 refills | Status: DC | PRN

## 2022-02-09 NOTE — Discharge Instructions (Signed)
I believe that the blood you saw on toilet tissue is related to irritation from yeast infection.  Take fluconazole 1 dose today and second dose in 3 days if symptoms persist.  Wear loosefitting cotton underwear and use hypoallergenic soaps and detergents.  If your symptoms or not improving please follow-up with your OB/GYN.  If you have any worsening symptoms including abdominal pain, fever, nausea, vomiting you need to go to the emergency room immediately.

## 2022-02-09 NOTE — ED Provider Notes (Signed)
Gina Cameron    CSN: [id number] Arrival date & time: 02/09/22  1722      History   Chief Complaint Chief Complaint  Patient presents with   Vaginal Bleeding    HPI Gina Cameron is a 36 y.o. female.   Patient presents today with a several day history of right-sided lower abdominal pain and abnormal uterine bleeding.  She reports spotting over the past several days which is abnormal as she has not had a menstrual cycle or vaginal bleeding since beginning Depo-Provera approximately a year ago.  She reports that the pain has improved significantly and is currently rated only 3 on a 0-10 pain scale, described as aching, no aggravating relieving factors identified.  She has not been taking any over-the-counter medication for symptom management.  She denies any recent stressors or illness.  Denies any fever, nausea, vomiting, changes in bowel habits, melena, hematochezia.  She denies history of ovarian cyst or fibroid.  She had pelvic ultrasound 2021 that showed adenomyosis and malpositioning of IUD but was otherwise normal.  She reports getting Pap smears annually and these have all been normal.  She denies any concern for STI or significant discharge but is open to testing.    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[facility])    Swelling of both lower extremities     Patient Active Problem List   Diagnosis Date Noted   Secondary amenorrhea 09/03/2020   Water retention 08/27/2020   At risk for diabetes mellitus 19/62/2297   Nonalcoholic hepatosteatosis 98/92/1194   Lower extremity edema 07/29/2020   Other hyperlipidemia  07/29/2020   Vitamin D deficiency 07/29/2020   Prediabetes 07/29/2020   Mood disorder ([place]) 07/15/2020   Reactive airway disease without complication 17/40/8144   Other fatigue 07/15/2020   SOBOE (shortness of breath on exertion) 07/15/2020   At risk for depression 07/15/2020   Depression, major, single episode, mild ([place]) 07/14/2020   Fatty liver 07/14/2020   Seasonal and perennial allergic rhinitis 02/29/2020   Mild persistent asthma, uncomplicated 81/85/6314   Seasonal allergies 01/15/2020   Pelvic pain 01/15/2020   Abdominal bloating 01/15/2020   Swelling 01/15/2020   Bipolar depression ([place]) 10/17/2019   Allergies 10/17/2019   Class 3 severe obesity with serious comorbidity and body mass index (BMI) of 40.0 to 44.9 in adult ([place]) 10/17/2019   Developmental delay 02/23/2018   Seizures ([place]) 02/23/2018   Hirsutism 02/23/2018   Cyst of ovary 02/23/2018   Complex tear of medial meniscus of right knee as current injury 03/01/2016   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   History of brain disorder 08/06/2014   Migraine    GERD 01/14/2010   OBSTRUCTIVE SLEEP APNEA 10/30/2009   Migraine without aura 06/10/2009   ECZEMA 03/05/2009   Depression with anxiety 07/24/2008   MILD MENTAL RETARDATION 01/15/2008   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 Authorizing Provider  fluconazole (DIFLUCAN) 150 MG tablet Take 1 tablet (150 mg total) by mouth every 3 (three)  days as needed. 02/09/22  Yes Carry Weesner, Junie Panning K, PA-C  amitriptyline (ELAVIL) 10 MG tablet TAKE 1 TABLET BY MOUTH EVERY NIGHT AT BEDTIME 07/31/20   Carollee Herter, Alferd Apa, DO  Cetirizine HCl (ZYRTEC ALLERGY) 10 MG CAPS Take 1 capsule (10 mg total) by mouth in the morning and at bedtime. 05/29/20   Valentina Shaggy, MD  EPINEPHrine 0.3 mg/0.3 mL IJ SOAJ injection SMARTSIG:0.3 Milligram(s) IM Once 05/29/20   [provider]  FLUoxetine (PROZAC) 20 MG capsule Take 1 capsule (20 mg total) by mouth daily. 01/15/20   Ann Held, DO  FLUoxetine (PROZAC) 20 MG capsule Take by mouth. 01/08/21   [provider]  furosemide (LASIX) 40 MG tablet Take 1 tablet (40 mg total) by mouth daily. 10/02/19   Ann Held, DO  hydrochlorothiazide (HYDRODIURIL) 25 MG tablet Take 25 mg by mouth daily. 01/21/21   [provider]  levocetirizine (XYZAL) 5 MG tablet Take by mouth. 01/08/21   [provider]  metFORMIN (GLUCOPHAGE) 500 MG tablet Take 500 mg by mouth 2 (two) times daily. 12/29/20   [provider]  montelukast (SINGULAIR) 10 MG tablet Take 1 tablet (10 mg total) by mouth at bedtime. 01/15/20   Ann Held, DO  omeprazole (PRILOSEC) 20 MG capsule TAKE 1 CAPSULE BY MOUTH EVERY DAY 08/04/20   Carollee Herter, Alferd Apa, DO  ondansetron (ZOFRAN ODT) 4 MG disintegrating tablet Take 1 tablet (4 mg total) by mouth every 8 (eight) hours as needed for nausea or vomiting. 03/05/21   Issac Moure, Derry Skill, PA-C  promethazine (PHENERGAN) 25 MG tablet Take 0.5-1 tablets (12.5-25 mg total) by mouth every 8 (eight) hours as needed for nausea or vomiting. 03/07/20   Zigmund Gottron, NP  VENTOLIN HFA 108 (90 Base) MCG/ACT inhaler INHALE 2 PUFFS INTO THE LUNGS EVERY 6 HOURS AS NEEDED FOR WHEEZING OR SHORTNESS OF BREATH 09/15/20   Valentina Shaggy, MD  Vitamin D, Ergocalciferol, (DRISDOL) 1.25 MG (50000 UNIT) CAPS capsule Take 1 capsule (50,000 Units total) by mouth every 7  (seven) days. 08/27/20   Mellody Dance, DO    Family History Family History  Problem Relation Age of Onset   Hypertension Father    Hyperlipidemia Father    Depression Father    Bipolar disorder Father    Hypertension Mother    Heart disease Mother    Cancer Mother    Hypertension Maternal Grandmother    Diabetes Maternal Grandmother    Hyperlipidemia Maternal Grandmother     Social History Social History   Tobacco Use   Smoking status: Former    Packs/day: 0.10    Years: 2.00  Total pack years: 0.20    Types: Cigarettes    Quit date: 09/06/2012    Years since quitting: 9.4   Smokeless tobacco: Never  Vaping Use   Vaping Use: Never used  Substance Use Topics   Alcohol use: Yes    Comment: Socially   Drug use: Not Currently    Types: Marijuana     Allergies   Diphenhydramine-zinc acetate, Lidocaine-epinephrine, Mepivacaine hcl, Diphenhydramine hcl, Grass extracts [gramineae pollens], Molds & smuts, Other, Penicillins, and Latex   Review of Systems Review of Systems  Constitutional:  Negative for activity change, appetite change, fatigue and fever.  Gastrointestinal:  Positive for abdominal pain. Negative for diarrhea, nausea and vomiting.  Genitourinary:  Positive for vaginal bleeding. Negative for dysuria, frequency, menstrual problem, pelvic pain, urgency, vaginal discharge and vaginal pain.     Physical Exam Triage Vital Signs ED Triage Vitals  Enc Vitals Group     BP 02/09/22 1746 131/78     Pulse Rate 02/09/22 1746 74     Resp 02/09/22 1746 20     Temp 02/09/22 1746 98.4 F (36.9 C)     Temp Source 02/09/22 1746 Oral     SpO2 02/09/22 1746 100 %     Weight --      Height --      Head Circumference --      Peak Flow --      Pain Score 02/09/22 1743 4     Pain Loc --      Pain Edu? --      Excl. in [place]? --    No data found.  Updated Vital Signs BP 131/78 (BP Location: Right Arm)   Pulse 74   Temp 98.4 F (36.9 C) (Oral)   Resp 20    SpO2 100%   Visual Acuity Right Eye Distance:   Left Eye Distance:   Bilateral Distance:    Right Eye Near:   Left Eye Near:    Bilateral Near:     Physical Exam Vitals reviewed.  Constitutional:      General: She is awake. She is not in acute distress.    Appearance: Normal appearance. She is well-developed. She is not ill-appearing.     Comments: Very pleasant female appears stated age in no acute distress sitting comfortably in exam room  HENT:     Head: Normocephalic and atraumatic.  Cardiovascular:     Rate and Rhythm: Normal rate and regular rhythm.     Heart sounds: Normal heart sounds, S1 normal and S2 normal. No murmur heard. Pulmonary:     Effort: Pulmonary effort is normal.     Breath sounds: Normal breath sounds. No wheezing, rhonchi or rales.     Comments: Clear to auscultation bilaterally Abdominal:     General: Bowel sounds are normal.     Palpations: Abdomen is soft.     Tenderness: There is abdominal tenderness in the right lower quadrant and suprapubic area. There is no right CVA tenderness, left CVA tenderness, guarding or rebound. Negative signs include Rovsing's sign, McBurney's sign and psoas sign.     Comments: Mild tenderness palpation and right lower quadrant.  No evidence of acute abdomen on physical exam.  Genitourinary:    Labia:        Right: No rash or tenderness.        Left: No rash or tenderness.      Vagina: Vaginal discharge present. No erythema or bleeding.     Cervix:  Normal.     Uterus: Normal.      Adnexa: Right adnexa normal and left adnexa normal.     Comments: Thick white discharge noted posterior vaginal vault and along vaginal walls.  No active bleeding noted at cervical os or in vagina. Psychiatric:        Behavior: Behavior is cooperative.      UC Treatments / Results  Labs (all labs ordered are listed, but only abnormal results are displayed) Labs Reviewed  POCT URINALYSIS DIPSTICK, ED / UC - Abnormal; Notable for the  following components:      Result Value   pH 8.5 (*)    Leukocytes,Ua SMALL (*)    All other components within normal limits  POC URINE PREG, ED  CERVICOVAGINAL ANCILLARY ONLY    EKG   Radiology No results found.  Procedures Procedures (including critical care time)  Medications Ordered in UC Medications - No data to display  Initial Impression / Assessment and Plan / UC Course  I have reviewed the triage vital signs and the nursing notes.  Pertinent labs & imaging results that were available during my care of the patient were reviewed by me and considered in my medical decision making (see chart for details).     Patient is well-appearing, afebrile, nontoxic, nontachycardic.  Based on vital signs and physical exam no indication for emergent evaluation or imaging.  Urine pregnancy was negative.  UA showed trace leukocyte esterase but suspect this is related to associated vaginitis given physical exam findings.  Treating patient for yeast vaginitis given clinical presentation.  She was prescribed Diflucan to be taken up to 2 doses spaced 72 hours apart as needed.  Recommended close follow-up with her OB/GYN.  Discussed that we do not have imaging capabilities in urgent care and if she has any worsening abdominal pain or additional symptoms including fever, nausea, vomiting she needs to go to the emergency room immediately.  She is to rest and drink plenty of fluid.  Strict return precautions given.  Patient declined work excuse note today.  Final Clinical Impressions(s) / UC Diagnoses   Final diagnoses:  Acute vaginitis  Vaginal spotting     Discharge Instructions      I believe that the blood you saw on toilet tissue is related to irritation from yeast infection.  Take fluconazole 1 dose today and second dose in 3 days if symptoms persist.  Wear loosefitting cotton underwear and use hypoallergenic soaps and detergents.  If your symptoms or not improving please follow-up with  your OB/GYN.  If you have any worsening symptoms including abdominal pain, fever, nausea, vomiting you need to go to the emergency room immediately.     ED Prescriptions     Medication Sig Dispense Auth. Provider   fluconazole (DIFLUCAN) 150 MG tablet Take 1 tablet (150 mg total) by mouth every 3 (three) days as needed. 2 tablet Stephanieann Popescu, Derry Skill, PA-C      PDMP not reviewed this encounter.   Terrilee Croak, PA-C 02/09/22 1827

## 2022-02-09 NOTE — ED Triage Notes (Signed)
Pt reports started having vaginal bleeding today. Thinks a cyst may has ruptured bc been having pains on right abd for a couple days.

## 2022-02-10 LAB — CERVICOVAGINAL ANCILLARY ONLY
Bacterial Vaginitis (gardnerella): NEGATIVE
Candida Glabrata: NEGATIVE
Candida Vaginitis: POSITIVE — AB
Chlamydia: NEGATIVE
Comment: NEGATIVE
Comment: NEGATIVE
Comment: NEGATIVE
Comment: NEGATIVE
Comment: NEGATIVE
Comment: NORMAL
Neisseria Gonorrhea: NEGATIVE
Trichomonas: NEGATIVE

## 2022-07-23 IMAGING — US US PELVIS COMPLETE WITH TRANSVAGINAL
1 series · 13 of 25 positions shown · non-contrast
Comparison: CT study from [DATE]

CLINICAL DATA: Abdominal pain and bloating for 1 year, history of
ovarian cyst removal and IUD.

EXAM:
TRANSABDOMINAL AND TRANSVAGINAL ULTRASOUND OF PELVIS
TECHNIQUE: Both transabdominal and transvaginal ultrasound examinations of the
pelvis were performed. Transabdominal technique was performed for
global imaging of the pelvis including uterus, ovaries, adnexal
regions, and pelvic cul-de-sac. It was necessary to proceed with
endovaginal exam following the transabdominal exam to visualize the
uterus adnexa and position of the IUD.

[Series 1: us pelvis complete with transvaginal · 0.22mm/px · 13 of 124 slices shown]
[im 1/124]
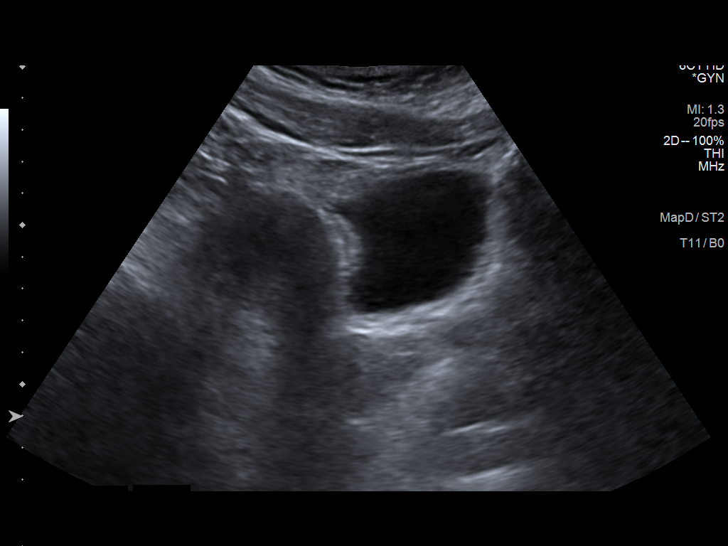
[im 11/124]
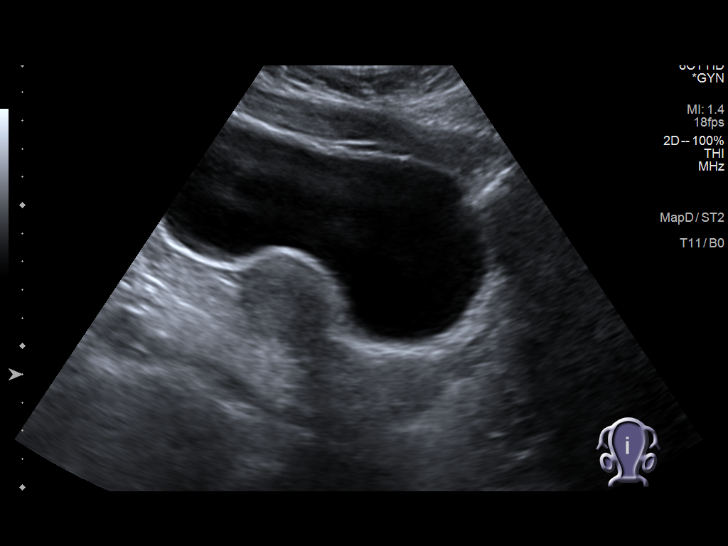
[im 21/124]
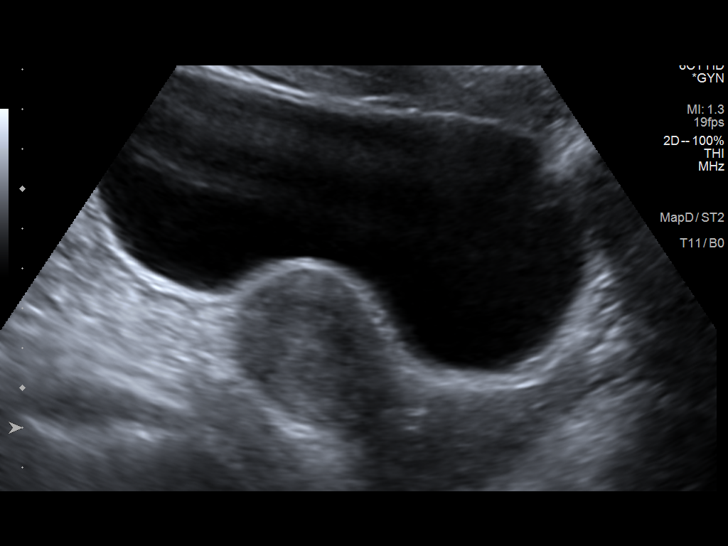
[im 31/124]
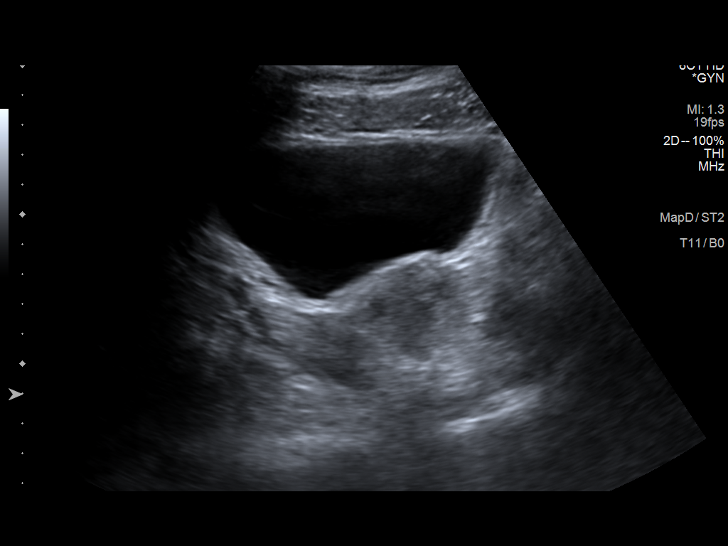
[im 42/124]
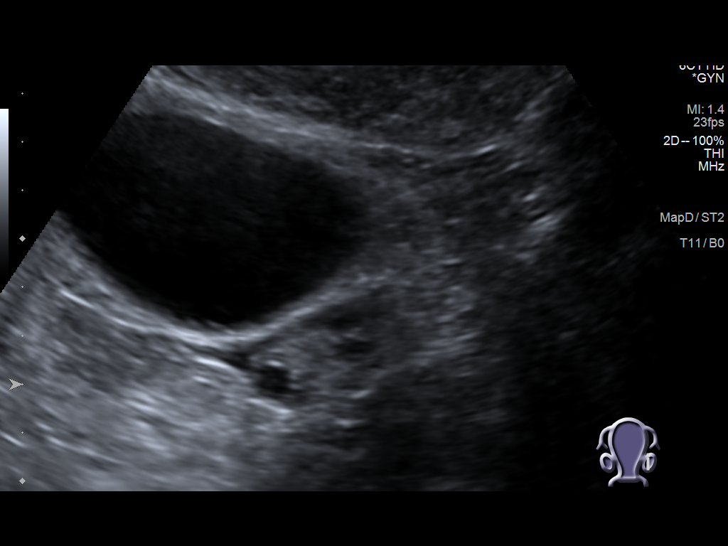
[im 52/124]
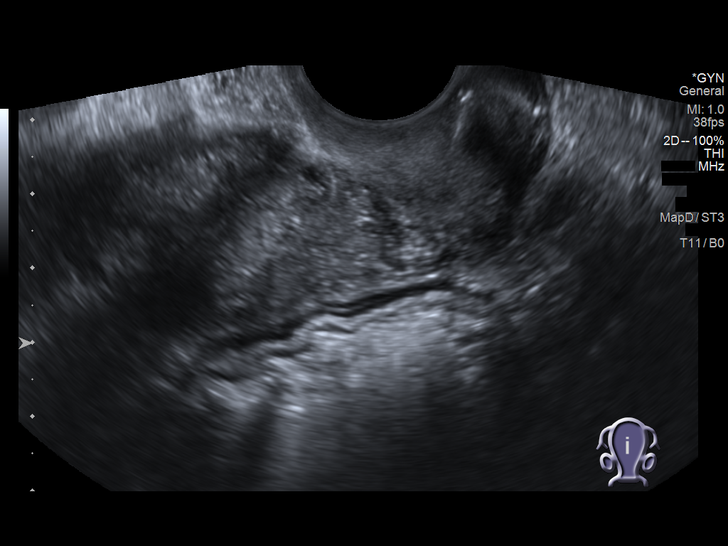
[im 62/124]
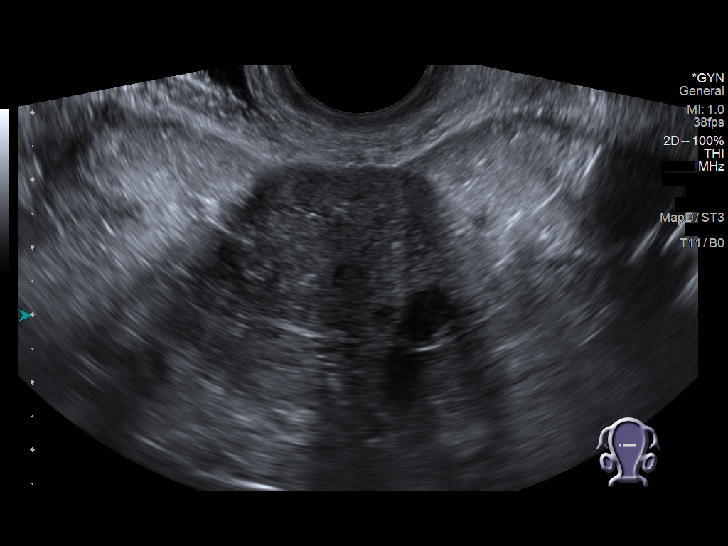
[im 72/124]
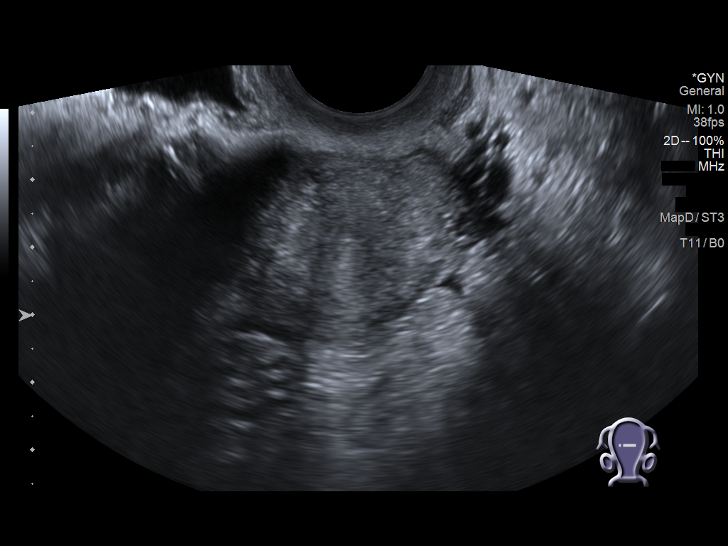
[im 83/124]
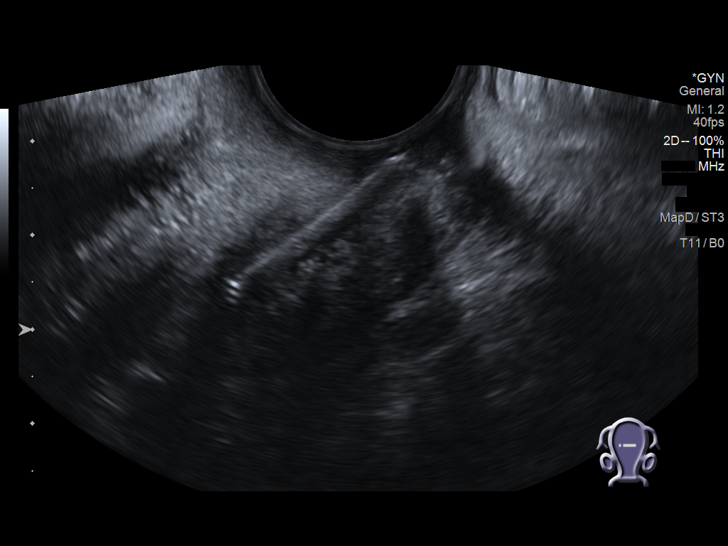
[im 93/124]
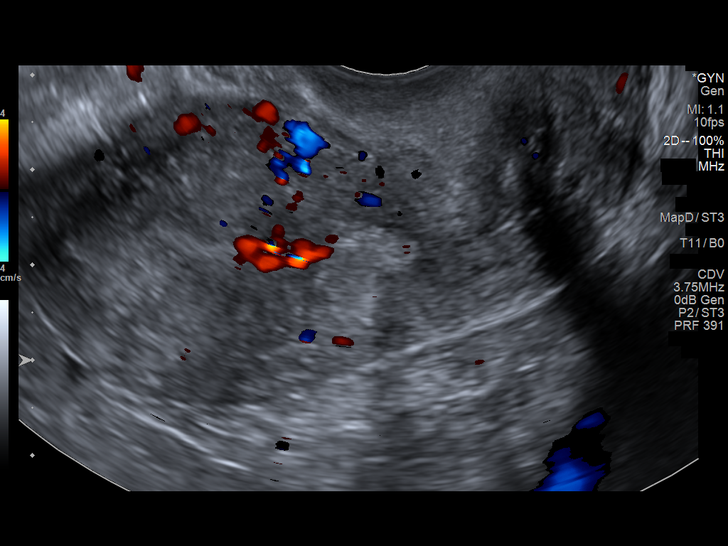
[im 103/124]
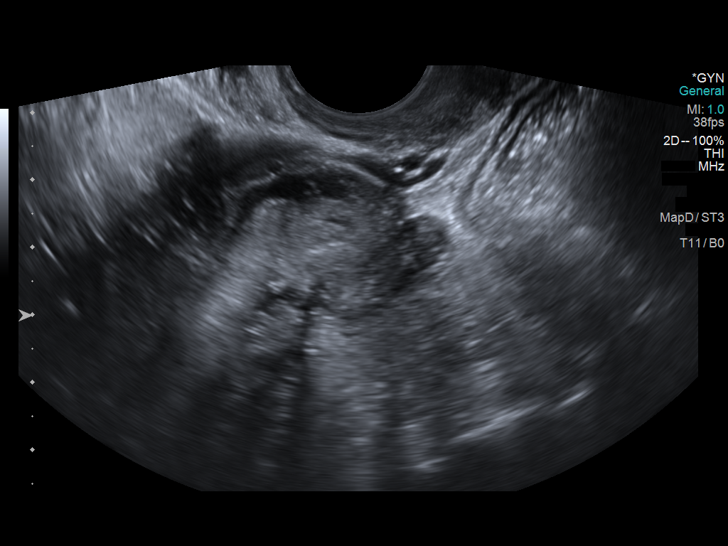
[im 113/124]
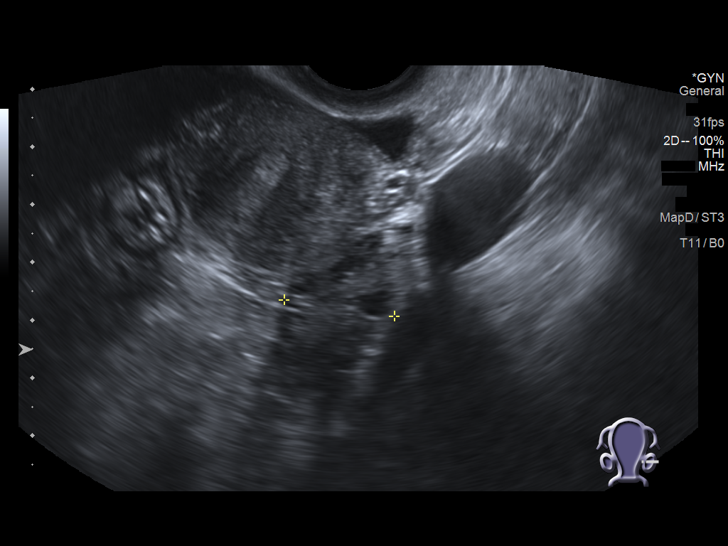
[im 124/124]
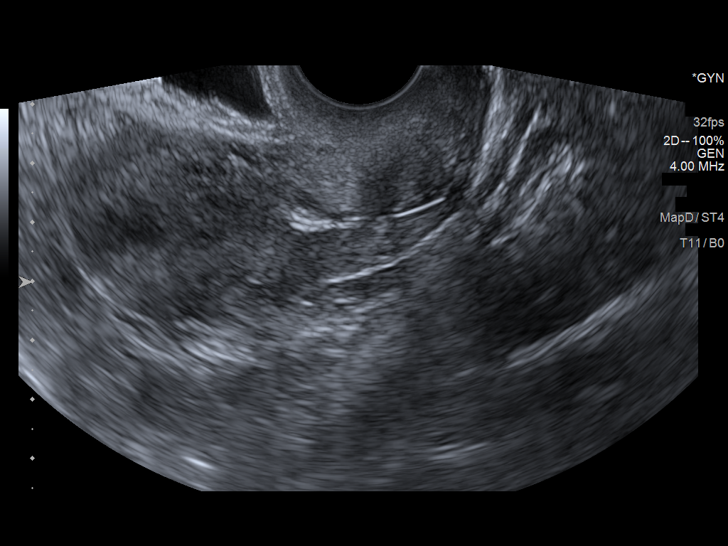

[13 of 25 positions shown; findings below may reference images not displayed]

FINDINGS: Uterus

Measurements: 7.5 x 3.4 x 4.7 cm = volume: 63 mL. Mildly
heterogeneous appearance of the myometrium.

Endometrium

Thickness: 3.2 mm. Mild increased vascularity of the endometrium in
the midportion without visible lesion, may somehow related to
malpositioned IUD. IUD is oriented transversely in the lower uterine
segment and the arms are in the plane are slightly off the plane of
the endometrium, the main portion of the IUD extending obliquely
into the cervix and passing likely and upper vagina.

Right ovary

Measurements: 4.8 x 3.2 x 2.9 cm = volume: 23 mL. Normal
appearance/no adnexal mass.

Left ovary

Measurements: 3.7 x 1.7 x 1.9 cm = volume: 6 mL. Normal
appearance/no adnexal mass.

Other findings

Trace free fluid
IMPRESSION: 1. Malpositioned IUD in the lower uterine segment extending into the
cervix and likely upper vagina. Correlation with pelvic exam and gyn
consultation is suggested.
2. Probable adenomyosis.
3. These results will be called to the ordering clinician or
representative by the Radiologist Assistant, and communication
documented in the PACS or [REDACTED].

## 2022-07-23 IMAGING — US US ABDOMEN COMPLETE
1 series · 14 of 25 positions shown · non-contrast
Comparison: August 07, 2009.

CLINICAL DATA: Abdominal bloating.

EXAM:
ABDOMEN ULTRASOUND COMPLETE

[Series 1: us abdomen complete · 0.22mm/px · 14 of 83 slices shown]
[im 1/83]
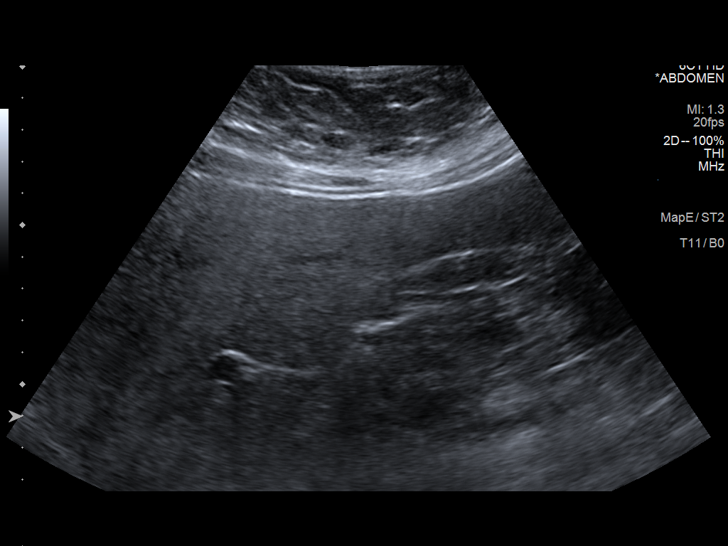
[im 7/83]
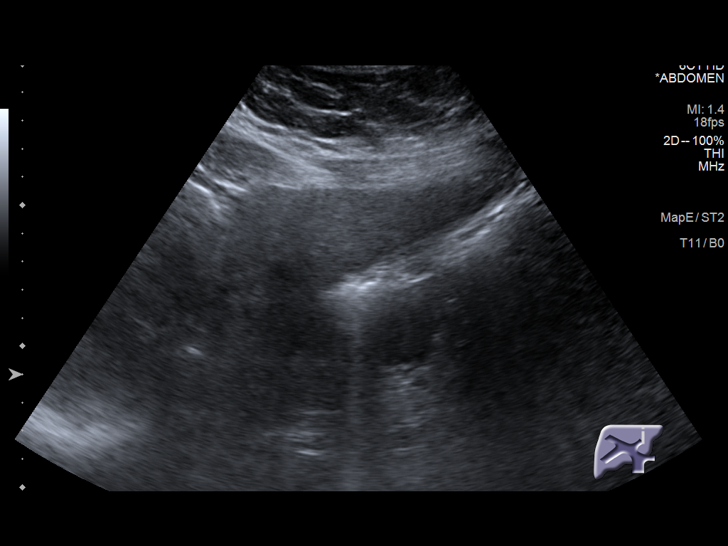
[im 14/83]
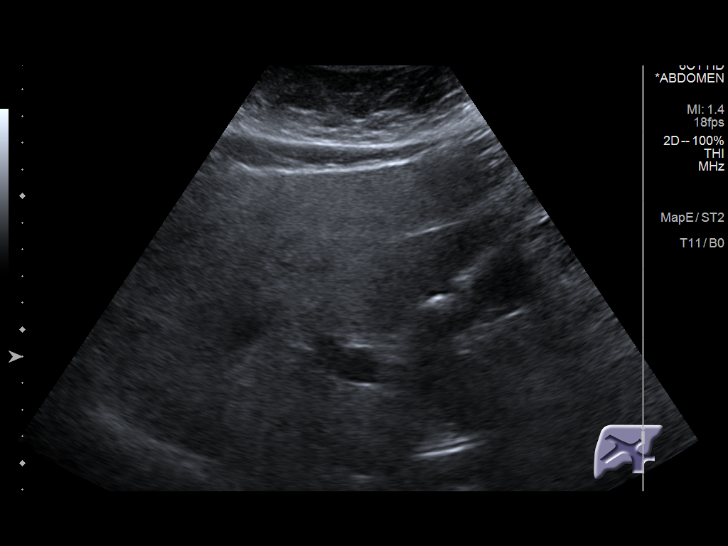
[im 21/83]
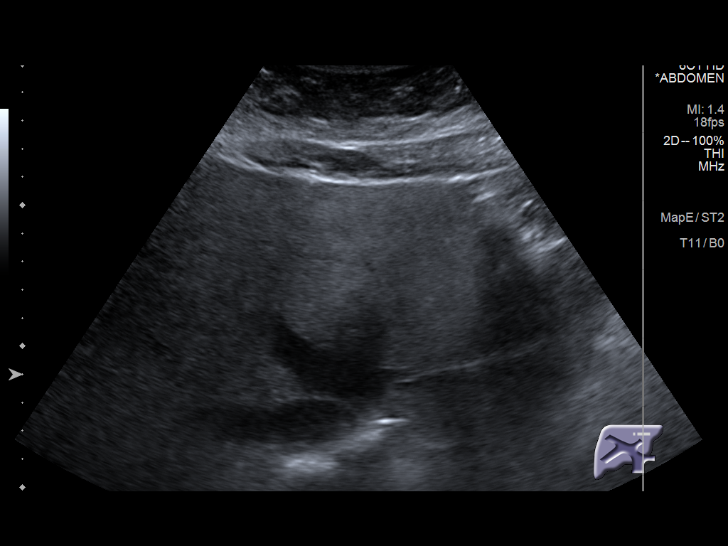
[im 28/83]
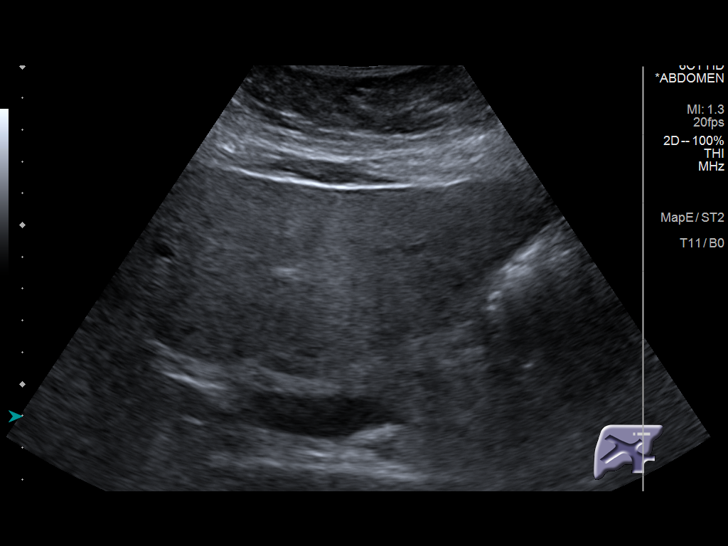
[im 31/83]
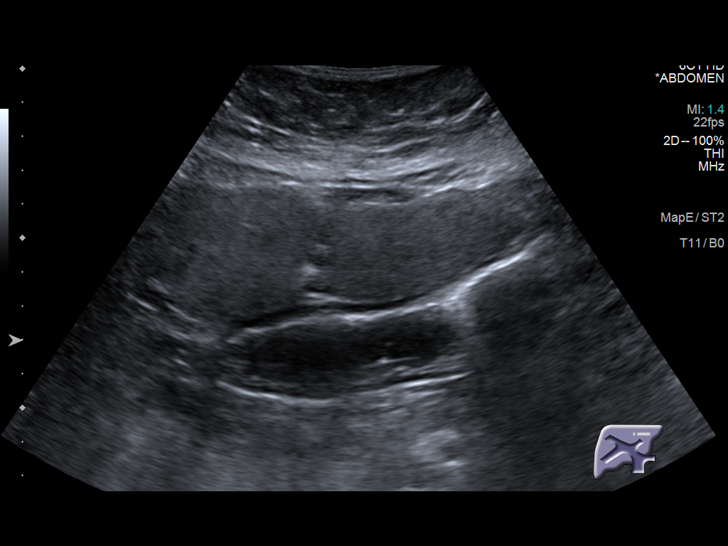
[im 38/83]
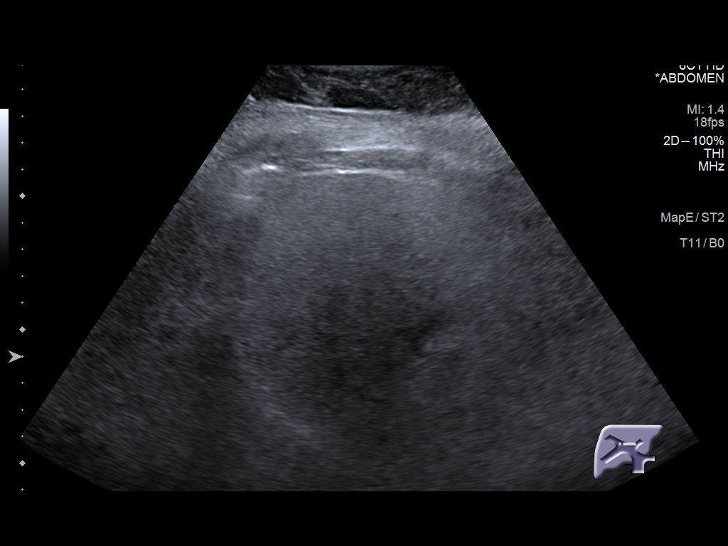
[im 45/83]
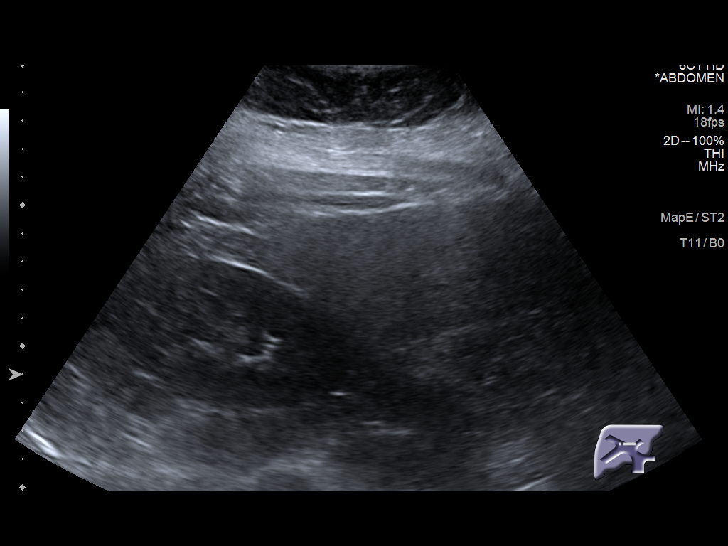
[im 52/83]
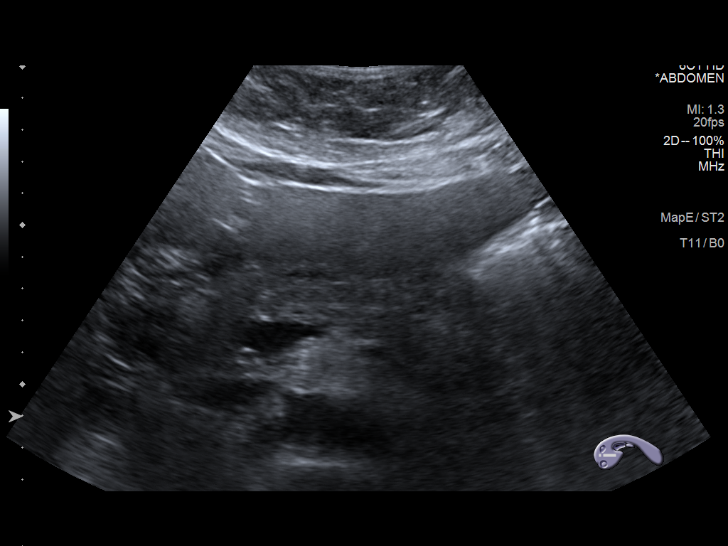
[im 55/83]
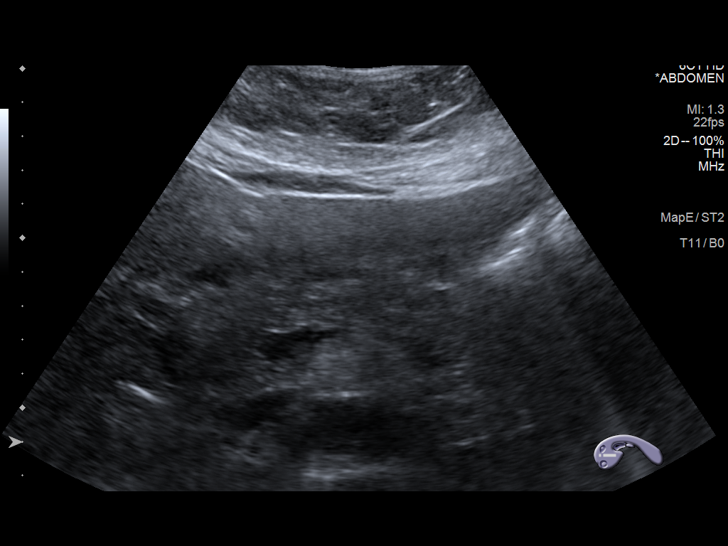
[im 62/83]
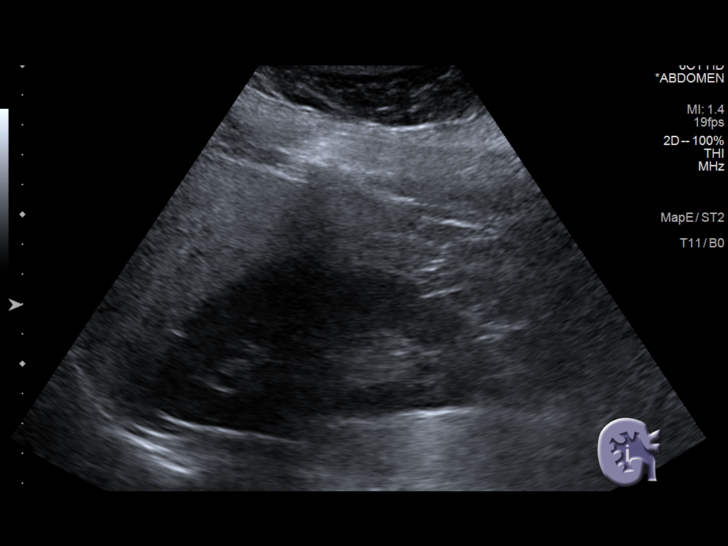
[im 69/83]
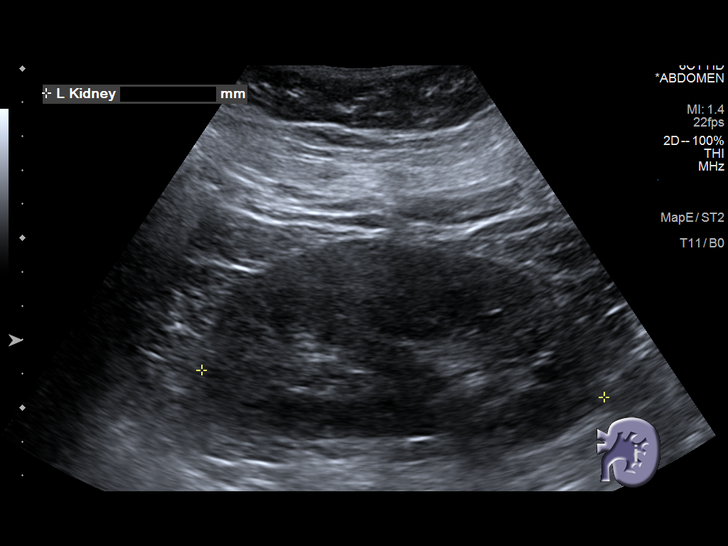
[im 76/83]
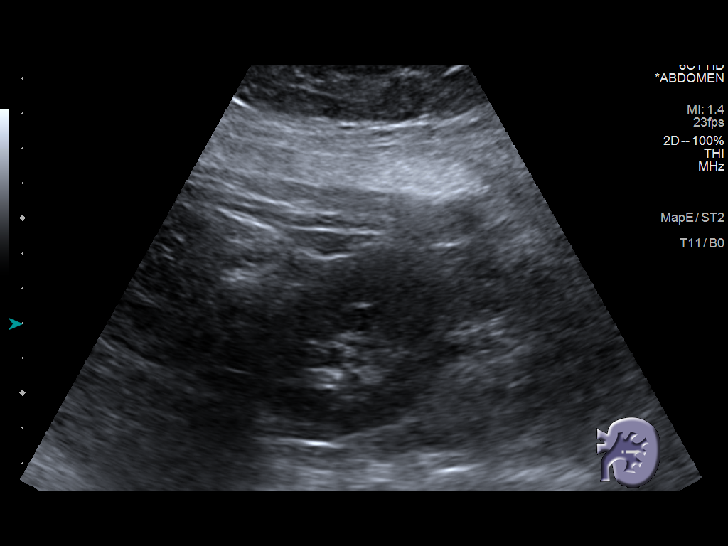
[im 83/83]
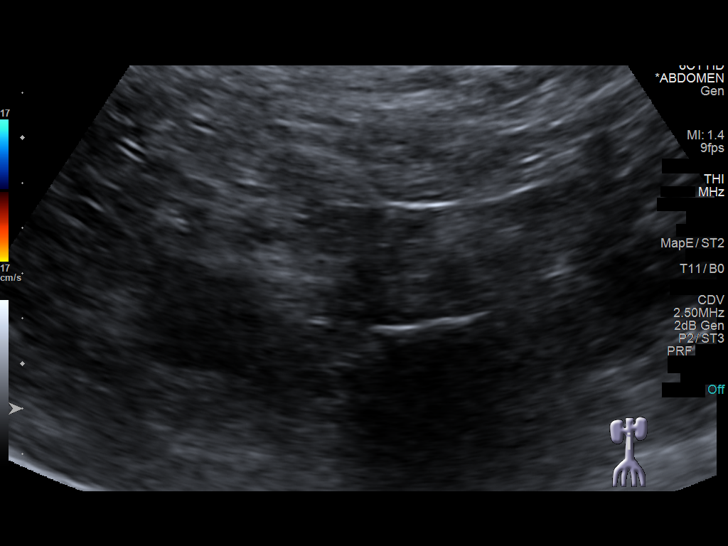

[14 of 25 positions shown; findings below may reference images not displayed]

FINDINGS: Gallbladder: Status post cholecystectomy.

Common bile duct: Diameter: 3 mm which is within normal limits.

Liver: No focal lesion identified. Increased echogenicity of hepatic
parenchyma is noted suggesting hepatic steatosis. Portal vein is
patent on color Doppler imaging with normal direction of blood flow
towards the liver.

IVC: No abnormality visualized.

Pancreas: Visualized portion unremarkable.

Spleen: Size and appearance within normal limits.

Right Kidney: Length: 11.8 cm. Echogenicity within normal limits. No
mass or hydronephrosis visualized.

Left Kidney: Length: 11.9 cm. Echogenicity within normal limits. No
mass or hydronephrosis visualized.

Abdominal aorta: No aneurysm visualized.

Other findings: None.
IMPRESSION: Status post cholecystectomy. Probable hepatic steatosis. No other
abnormality seen in the abdomen.

## 2022-09-05 ENCOUNTER — Encounter (HOSPITAL_COMMUNITY): Payer: Self-pay

## 2022-09-05 ENCOUNTER — Ambulatory Visit (HOSPITAL_COMMUNITY)
Admission: EM | Admit: 2022-09-05 | Discharge: 2022-09-05 | Disposition: A | Discharge status: Home / Self Care | Payer: 59 | Attending: Emergency Medicine | Admitting: Emergency Medicine

## 2022-09-05 DIAGNOSIS — Z20822 Contact with and (suspected) exposure to covid-19: Secondary | ICD-10-CM

## 2022-09-05 DIAGNOSIS — R0981 Nasal congestion: Secondary | ICD-10-CM | POA: Insufficient documentation

## 2022-09-05 DIAGNOSIS — J302 Other seasonal allergic rhinitis: Secondary | ICD-10-CM | POA: Insufficient documentation

## 2022-09-05 LAB — POC INFLUENZA A AND B ANTIGEN (URGENT CARE ONLY)
INFLUENZA A ANTIGEN, POC: NEGATIVE
INFLUENZA B ANTIGEN, POC: NEGATIVE

## 2022-09-05 LAB — POCT RAPID STREP A, ED / UC: Streptococcus, Group A Screen (Direct): NEGATIVE

## 2022-09-05 MED ORDER — GUAIFENESIN ER 600 MG PO TB12: 1200.0000 mg | ORAL_TABLET | Freq: Two times a day (BID) | ORAL | 0 refills | Status: AC

## 2022-09-05 NOTE — ED Triage Notes (Signed)
Patient reports that she was exposed to Covid. Patient states she has seasonal allergies and has voice changes and states her parents wanted her to get checked for strep throat, but denies a sore throat.

## 2022-09-05 NOTE — ED Provider Notes (Signed)
[place]    CSN: [id number] Arrival date & time: 09/05/22  1154      History   Chief Complaint Chief Complaint  Patient presents with   Covid Exposure    HPI Gina Cameron is a 37 y.o. female.   Patient presents to clinic for chief complaint of nasal congestion and voice change.  Has been ongoing over the past week.  Patient reports seasonal allergies, takes daily antihistamines.  She last saw her father on Thursday, he tested positive for COVID-19 today.  Patient denies cough, fevers, body aches or chills.  Denies nausea vomiting diarrhea, shortness of breath or chest pain. Parents are requesting testing for strep, covid and flu.   History is significant for Sturge-Weber syndrome, intellectual disability, asthma, bipolar 1 and 2 and obesity.   The history is provided by the patient and medical records.    Past Medical History:  Diagnosis Date   Anxiety    Asthma    Bipolar 1 disorder ([place])    Bipolar 2 disorder ([place])    Constipation    Depression    Fatty liver    Gallbladder problem    GERD (gastroesophageal reflux disease)    Hives    Joint pain    Leg edema    Migraine    PTSD (post-traumatic stress disorder)    Reflux    Seizures (H[facility])    Sleep apnea    Sturge-Weber syndrome ([facility])    Swelling of both lower extremities     Patient Active Problem List   Diagnosis Date Noted   Secondary amenorrhea 09/03/2020   Water retention 08/27/2020   At risk for diabetes mellitus [id number]   Nonalcoholic hepatosteatosis [id number]   Lower extremity edema 07/29/2020   Other hyperlipidemia 07/29/2020   Vitamin D deficiency 07/29/2020   Prediabetes 07/29/2020   Mood disorder ([place]) 07/15/2020   Reactive airway disease without complication [id number]   Other fatigue 07/15/2020   SOBOE (shortness of breath on exertion) 07/15/2020   At risk for depression 07/15/2020   Depression, major, single episode, mild ([place]) 07/14/2020   Fatty liver 07/14/2020    Seasonal and perennial allergic rhinitis 02/29/2020   Mild persistent asthma, uncomplicated [id number]   Seasonal allergies 01/15/2020   Pelvic pain 01/15/2020   Abdominal bloating 01/15/2020   Swelling 01/15/2020   Bipolar depression ([place]) 10/17/2019   Allergies 10/17/2019   Class 3 severe obesity with serious comorbidity and body mass index (BMI) of 40.0 to 44.9 in adult ([place]) 10/17/2019   Developmental delay 02/23/2018   Seizures ([place]) 02/23/2018   Hirsutism 02/23/2018   Cyst of ovary 02/23/2018   Complex tear of medial meniscus of right knee as current injury 03/01/2016   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   History of brain disorder 08/06/2014   Migraine    GERD 01/14/2010   OBSTRUCTIVE SLEEP APNEA 10/30/2009   Migraine without aura 06/10/2009   ECZEMA 03/05/2009   Depression with anxiety 07/24/2008   MILD MENTAL RETARDATION 01/15/2008   Sturge-Weber syndrome ([place]) 01/15/2008   Congenital hamartoma ([place]) 01/15/2008   MORBID OBESITY 01/10/2008   Flexural atopic dermatitis 11/30/2006    Past Surgical History:  Procedure Laterality Date   CHOLECYSTECTOMY     OVARIAN CYST REMOVAL     WISDOM TOOTH EXTRACTION      OB History     Gravida  0   Para  0   Term  0   Preterm  0   AB  0   Living  0      SAB  0   IAB  0   Ectopic  0   Multiple  0   Live Births  0            Home Medications    Prior to Admission medications   Medication Sig Start Date End Date Taking? Authorizing Provider  guaiFENesin (MUCINEX) 600 MG 12 hr tablet Take 2 tablets (1,200 mg total) by mouth 2 (two) times daily for 5 days. 09/05/22 09/10/22 Yes Louretta Shorten, [place] N, FNP  amitriptyline (ELAVIL) 10 MG tablet TAKE 1 TABLET BY MOUTH EVERY NIGHT AT BEDTIME 07/31/20   Carollee Herter, Alfer[place] Apa, DO  Cetirizine HCl (ZYRTEC ALLERGY) 10 MG CAPS Take 1 capsule (10 mg total) by mouth in the morning and at bedtime. 05/29/20   Valentina Shaggy, MD  EPINEPHrine 0.3 mg/0.3 mL IJ  SOAJ injection SMARTSIG:0.3 Milligram(s) IM Once 05/29/20   [provider]  FLUoxetine (PROZAC) 20 MG capsule Take 1 capsule (20 mg total) by mouth daily. 01/15/20   Ann Held, DO  FLUoxetine (PROZAC) 20 MG capsule Take by mouth. 01/08/21   [provider]  furosemide (LASIX) 40 MG tablet Take 1 tablet (40 mg total) by mouth daily. 10/02/19   Ann Held, DO  hydrochlorothiazide (HYDRODIURIL) 25 MG tablet Take 25 mg by mouth daily. 01/21/21   [provider]  levocetirizine (XYZAL) 5 MG tablet Take by mouth. 01/08/21   [provider]  metFORMIN (GLUCOPHAGE) 500 MG tablet Take 500 mg by mouth 2 (two) times daily. 12/29/20   [provider]  montelukast (SINGULAIR) 10 MG tablet Take 1 tablet (10 mg total) by mouth at bedtime. 01/15/20   Ann Held, DO  omeprazole (PRILOSEC) 20 MG capsule TAKE 1 CAPSULE BY MOUTH EVERY DAY 08/04/20   Carollee Herter, Alferd Apa, DO  ondansetron (ZOFRAN ODT) 4 MG disintegrating tablet Take 1 tablet (4 mg total) by mouth every 8 (eight) hours as needed for nausea or vomiting. 03/05/21   Raspet, Derry Skill, PA-C  promethazine (PHENERGAN) 25 MG tablet Take 0.5-1 tablets (12.5-25 mg total) by mouth every 8 (eight) hours as needed for nausea or vomiting. 03/07/20   Zigmund Gottron, NP  VENTOLIN HFA 108 (90 Base) MCG/ACT inhaler INHALE 2 PUFFS INTO THE LUNGS EVERY 6 HOURS AS NEEDED FOR WHEEZING OR SHORTNESS OF BREATH 09/15/20   Valentina Shaggy, MD  Vitamin D, Ergocalciferol, (DRISDOL) 1.25 MG (50000 UNIT) CAPS capsule Take 1 capsule (50,000 Units total) by mouth every 7 (seven) days. 08/27/20   Mellody Dance, DO    Family History Family History  Problem Relation Age of Onset   Hypertension Father    Hyperlipidemia Father    Depression Father    Bipolar disorder Father    Hypertension Mother    Heart disease Mother    Cancer Mother    Hypertension Maternal Grandmother    Diabetes Maternal Grandmother     Hyperlipidemia Maternal Grandmother     Social History Social History   Tobacco Use   Smoking status: Former    Packs/day: 0.10    Years: 2.00    Additional pack years: 0.00    Total pack years: 0.20    Types: Cigarettes    Quit date: 09/06/2012    Years since quitting: 10.0   Smokeless tobacco: Never  Vaping Use   Vaping Use: Never used  Substance Use Topics   Alcohol use: Not Currently  Comment: Socially   Drug use: Not Currently    Types: Marijuana     Allergies   Diphenhydramine-zinc acetate, Lidocaine-epinephrine, Mepivacaine hcl, Diphenhydramine hcl, Grass extracts [gramineae pollens], Molds & smuts, Other, Penicillins, and Latex   Review of Systems Review of Systems  Constitutional:  Negative for chills, fatigue and fever.  HENT:  Positive for congestion, postnasal drip, rhinorrhea and voice change. Negative for sore throat.   Eyes:  Negative for discharge and itching.  Respiratory:  Negative for cough, shortness of breath and wheezing.   Cardiovascular:  Negative for chest pain.  Gastrointestinal:  Negative for abdominal pain.  Genitourinary:  Negative for dysuria.  Neurological:  Negative for headaches.     Physical Exam Triage Vital Signs ED Triage Vitals  Enc Vitals Group     BP 09/05/22 1236 115/78     Pulse Rate 09/05/22 1236 98     Resp 09/05/22 1236 16     Temp 09/05/22 1236 98.8 F (37.1 C)     Temp Source 09/05/22 1236 Oral     SpO2 09/05/22 1236 97 %     Weight --      Height --      Head Circumference --      Peak Flow --      Pain Score 09/05/22 1238 0     Pain Loc --      Pain Edu? --      Excl. in [place]? --    No data found.  Updated Vital Signs BP 115/78 (BP Location: Right Arm)   Pulse 98   Temp 98.8 F (37.1 C) (Oral)   Resp 16   SpO2 97%   Visual Acuity Right Eye Distance:   Left Eye Distance:   Bilateral Distance:    Right Eye Near:   Left Eye Near:    Bilateral Near:     Physical Exam Vitals and nursing note  reviewed.  Constitutional:      Appearance: Normal appearance.  HENT:     Head: Normocephalic and atraumatic.     Right Ear: External ear normal.     Left Ear: External ear normal.     Nose: Congestion and rhinorrhea present.     Mouth/Throat:     Mouth: Mucous membranes are moist.     Pharynx: Uvula midline. Posterior oropharyngeal erythema present. No pharyngeal swelling, oropharyngeal exudate or uvula swelling.     Tonsils: Tonsillar exudate present. No tonsillar abscesses. 2+ on the right. 2+ on the left.  Eyes:     General: Lids are normal.     Extraocular Movements: Extraocular movements intact.     Pupils: Pupils are equal, round, and reactive to light.  Cardiovascular:     Rate and Rhythm: Normal rate and regular rhythm.     Heart sounds: Normal heart sounds, S1 normal and S2 normal. No murmur heard. Pulmonary:     Effort: Pulmonary effort is normal. No respiratory distress.     Breath sounds: Normal breath sounds.     Comments: Lungs vesicular posteriorly. Musculoskeletal:     Cervical back: Normal range of motion.  Lymphadenopathy:     Cervical: Cervical adenopathy present.  Neurological:     Mental Status: She is alert.  Psychiatric:        Behavior: Behavior is cooperative.      UC Treatments / Results  Labs (all labs ordered are listed, but only abnormal results are displayed) Labs Reviewed  SARS CORONAVIRUS 2 (TAT 6-24 HRS)  POCT RAPID STREP A, ED / UC  POC INFLUENZA A AND B ANTIGEN (URGENT CARE ONLY)    EKG   Radiology No results found.  Procedures Procedures (including critical care time)  Medications Ordered in UC Medications - No data to display  Initial Impression / Assessment and Plan / UC Course  I have reviewed the triage vital signs and the nursing notes.  Pertinent labs & imaging results that were available during my care of the patient were reviewed by me and considered in my medical decision making (see chart for details).  Vitals  in triage reviewed, patient is hemodynamically stable.  Recent exposure to COVID-19, testing completed in clinic.  Reports nasal congestion and voice change, tonsils with exudate on physical exam, rapid strep testing negative in clinic. Flu testing negative.   Advised to continue treatment for seasonal allergies and symptomatic treatment if symptoms develop for viral URI.  Patient verbalized understanding, no questions at this time.    Final Clinical Impressions(s) / UC Diagnoses   Final diagnoses:  Seasonal allergies  Nasal congestion  Contact with and (suspected) exposure to covid-19     Discharge Instructions      Your strep and flu tests were negative in clinic.   Your Covid-19 test is pending and we will call if positive. If asymptomatic positive, you can return to work, you just need to wear a mask over the next 5 days. You can use mucinex to help loosen your nasal secretions, please drink at least 64 ounces of water daily.   Continue with your antihistamines for your allergies.   Please return to clinic if you have any new or concerning symptoms.      ED Prescriptions     Medication Sig Dispense Auth. Provider   guaiFENesin (MUCINEX) 600 MG 12 hr tablet Take 2 tablets (1,200 mg total) by mouth 2 (two) times daily for 5 days. 20 tablet Teron Blais, [place] N, [place]      PDMP not reviewed this encounter.   Ireoluwa Gorsline, [place] N, [place] 09/05/22 1346

## 2022-09-05 NOTE — Discharge Instructions (Addendum)
Your strep and flu tests were negative in clinic.   Your Covid-19 test is pending and we will call if positive. If asymptomatic positive, you can return to work, you just need to wear a mask over the next 5 days. You can use mucinex to help loosen your nasal secretions, please drink at least 64 ounces of water daily.   Continue with your antihistamines for your allergies.   Please return to clinic if you have any new or concerning symptoms.

## 2022-09-06 LAB — SARS CORONAVIRUS 2 (TAT 6-24 HRS): SARS Coronavirus 2: NEGATIVE

## 2022-12-04 IMAGING — MR MR BRAIN/IAC WO/W CM
11 of 12 series · 35 of 48 positions shown · IV contrast (20ml Multihance)
Comparison: Prior brain MRI from 06/12/2009 and CT from
01/31/2009.

CLINICAL DATA: 34-year-old female with history of Sturge-Weber
syndrome, with 2 year history of pulsatile tinnitus of right ear.

EXAM:
MRI HEAD WITHOUT AND WITH CONTRAST
MRA HEAD WITHOUT CONTRAST
TECHNIQUE: Multiplanar, multiecho pulse sequences of the brain and surrounding
structures were obtained without and with intravenous contrast. An
IAC protocol was utilized. Angiographic images of the head were
obtained using MRA technique without contrast.
CONTRAST:  20mL MULTIHANCE GADOBENATE DIMEGLUMINE 529 MG/ML IV SOLN

[Series 2: T1 · sagittal · 5.0mm · 0.47mm/px · 3 of 25 slices shown (1 of 4)]
[im 1/25]
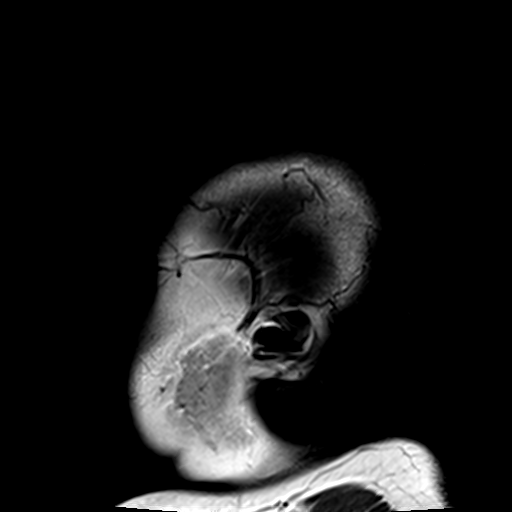
[im 13/25]
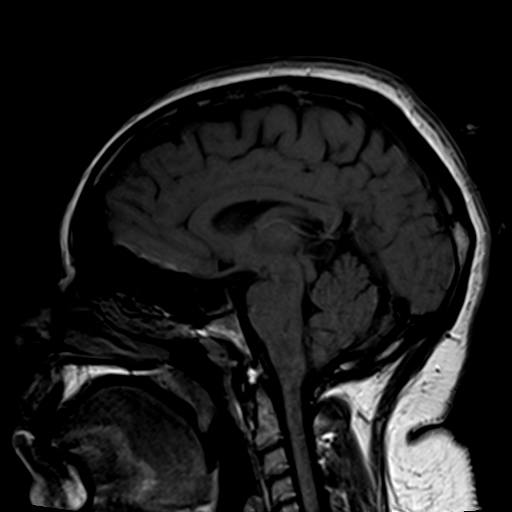
[im 25/25]
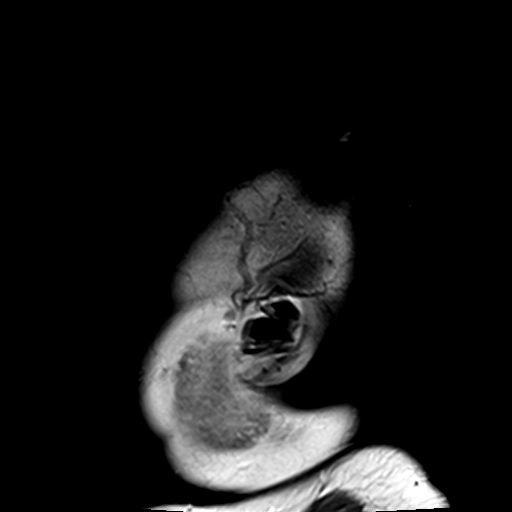

[Series 3: ep2d_diff_3 · axial · 3.0mm · 1.88mm/px · z∈[-87,+60]mm · 8 of 103 slices shown]
[im 1/103]
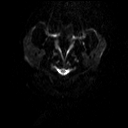
[im 12/103]
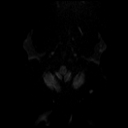
[im 35/103]
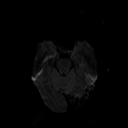
[im 46/103]
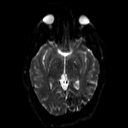
[im 57/103]
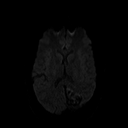
[im 69/103]
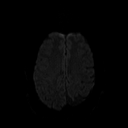
[im 91/103]
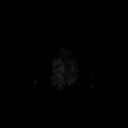
[im 103/103]
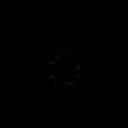

[Series 4: ep2d_diff_3_adc · axial · 3.0mm · 1.88mm/px · z∈[-87,+60]mm · 5 of 52 slices shown]
[im 1/52]
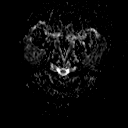
[im 13/52]
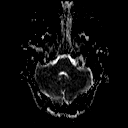
[im 26/52]
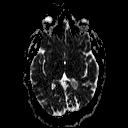
[im 39/52]
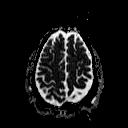
[im 52/52]
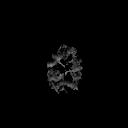

[Series 5: t2_tse_tra_512 · axial · 5.0mm · 0.62mm/px · z∈[-94,+56]mm · 3 of 26 slices shown]
[im 1/26]
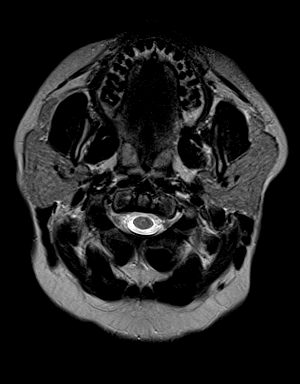
[im 13/26]
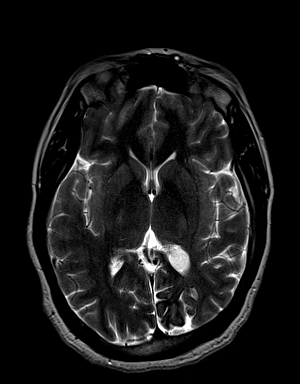
[im 26/26]
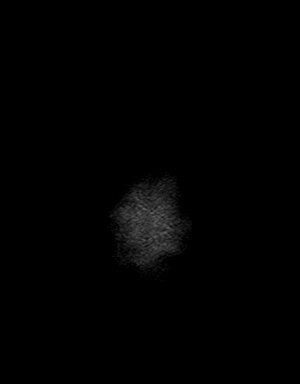

[Series 6: FLAIR · axial · 3.0mm · 0.47mm/px · z∈[-93,+57]mm · 3 of 27 slices shown]
[im 1/27]
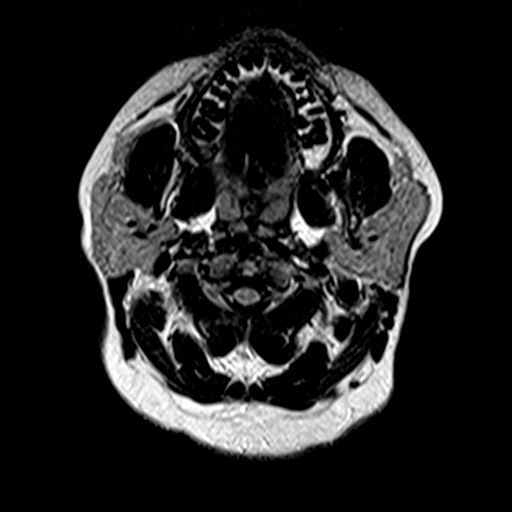
[im 14/27]
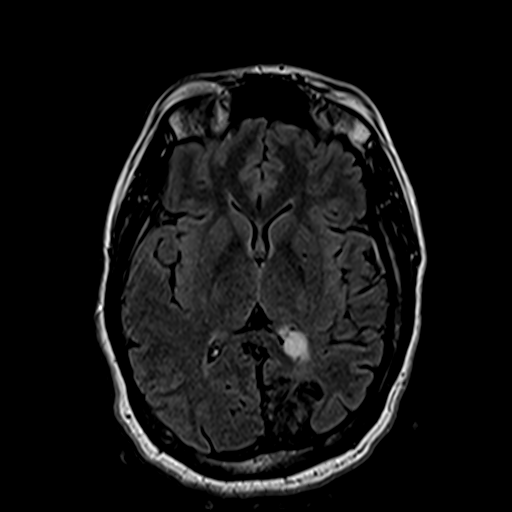
[im 27/27]
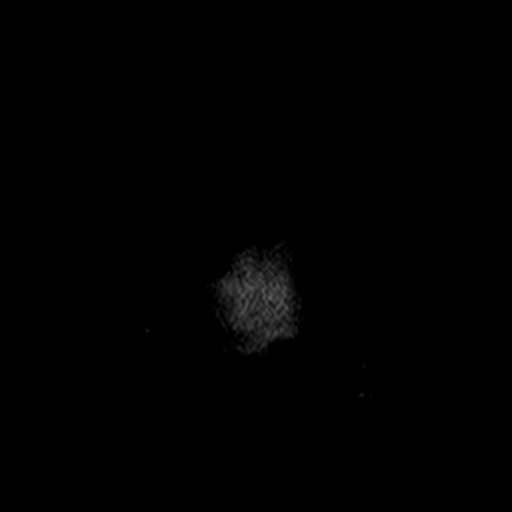

[Series 8: swi_images · axial · 2.0mm · 0.94mm/px · z∈[-95,+57]mm · 8 of 80 slices shown]
[im 1/80]
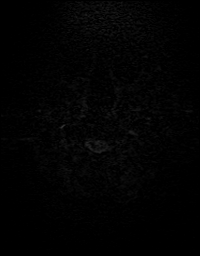
[im 12/80]
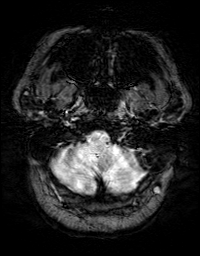
[im 23/80]
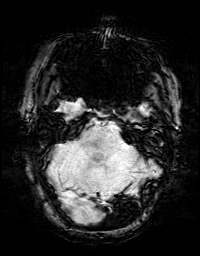
[im 34/80]
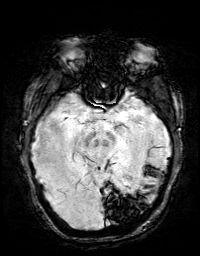
[im 46/80]
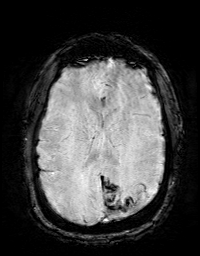
[im 57/80]
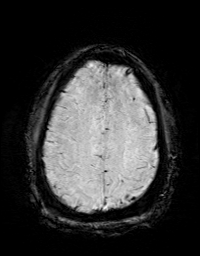
[im 68/80]
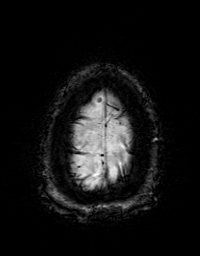
[im 80/80]
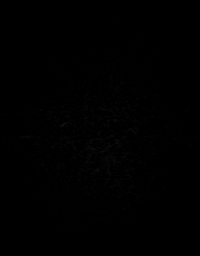

[Series 9: T1 · coronal · 3.0mm · 0.35mm/px · 1 of 13 slices shown (2 of 4)]
[im 1/13]
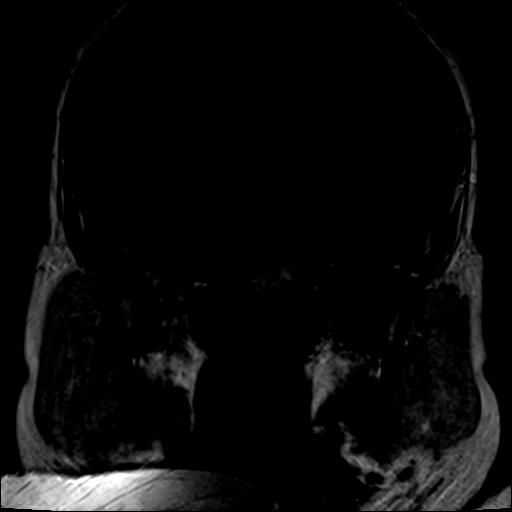

[Series 10: T1 · axial · 3.0mm · 0.35mm/px · 1 of 13 slices shown (3 of 4)]
[im 1/13]
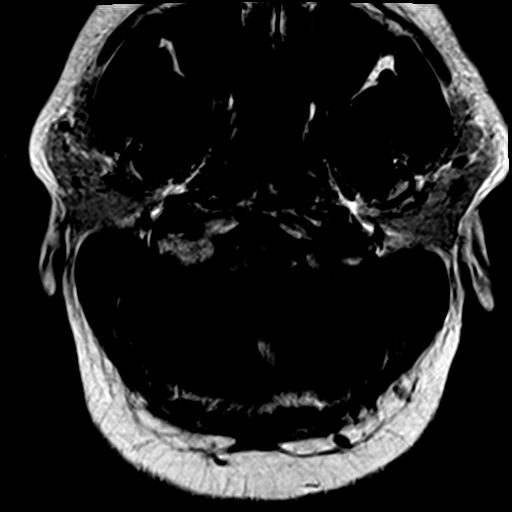

[Series 11: bSSFP · axial · 0.7mm · 0.28mm/px · 1 of 44 slices shown]
[im 1/44]
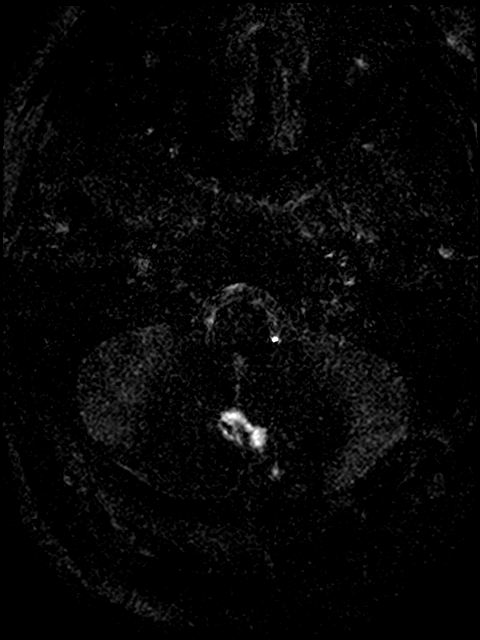

[Series 12: T1 · coronal · 3.0mm · 0.35mm/px · 1 of 13 slices shown (4 of 4)]
[im 1/13]
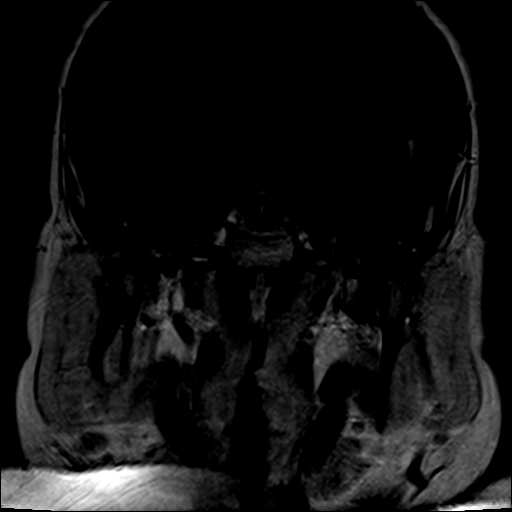

[Series 13: T1 post-contrast · axial · 3.0mm · 0.35mm/px · 1 of 13 slices shown]
[im 1/13]
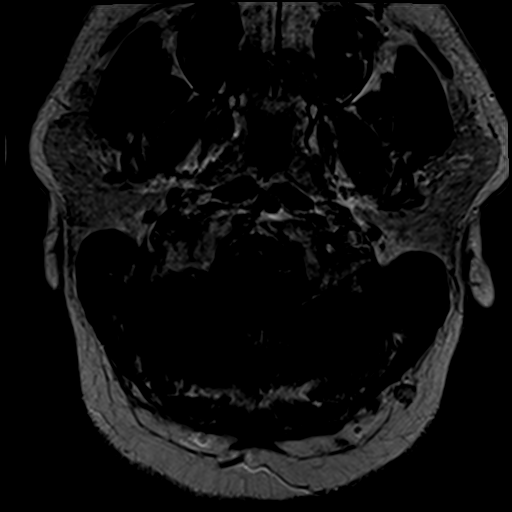

[35 of 48 positions shown; findings below may reference images not displayed]

FINDINGS: MRI HEAD FINDINGS

Brain: Examination mildly degraded by motion artifact.

Parenchymal atrophy involving the left parietal and occipital lobes,
stable to perhaps minimally progressed as compared to previous exam.
Associated extensive gyriform susceptibility artifact consistent
with calcification as seen on previous CT. Asymmetric enlargement
and enhancement of the left choroid plexus. Constellation of
findings consistent with known history of Sturge-Weber syndrome.
Scattered left a meningeal enhancement within the area affected
consistent with pial angiomatosis. Mild overlying dural thickening
and enhancement noted as well.

Elsewhere, the remainder the brain remains relatively normal in
appearance with preserved gray-white matter differentiation. No
abnormal foci of restricted diffusion to suggest acute or subacute
ischemia. Gray-white matter differentiation otherwise maintained. No
other new areas of encephalomalacia to suggest chronic or interval
infarction. No other foci of susceptibility artifact to suggest
acute or chronic intracranial hemorrhage.

No mass lesion, midline shift or mass effect. Stable ventricular
size and morphology without hydrocephalus. No extra-axial
collection. No other abnormal enhancement.

Empty sella, progressed from previous exam. Midline structures
intact.

Thin section imaging through the internal auditory canals was
performed. Images are somewhat degraded by motion artifact. Seventh
and eighth cranial nerves are seen coursing normally through the
cerebellopontine angle cisterns into the internal auditory canals.
No CPA angle mass or collection. No intracanalicular mass or
abnormal enhancement. Inner ear structures including the vestibulae,
cochlea, and semi circular canals appear grossly normal. Normal post
ganglionic enhancement seen within the seventh cranial nerves
bilaterally. Small right mastoid effusion. Left mastoid air cells
clear.

Vascular: Major intracranial vascular flow voids are maintained.
Major dural sinuses appear grossly patent.

Skull and upper cervical spine: Craniocervical junction within
normal limits. Upper cervical spine normal. Bone marrow signal
intensity normal. Asymmetric thickening involving the left
parieto-occipital calvarium in keeping with Sturge-Weber syndrome.

Sinuses/Orbits: Globes and orbital soft tissues demonstrate a normal
MRI appearance. Small left maxillary sinus retention cyst. Paranasal
sinuses are otherwise clear.

Other: None.

MRA HEAD FINDINGS

ANTERIOR CIRCULATION:

Examination degraded by motion artifact. Visualized distal cervical
segments of the internal carotid arteries are patent with antegrade
flow. Petrous, cavernous, and supraclinoid segments patent without
appreciable stenosis or other abnormality. A1 segments patent
bilaterally. Left A1 slightly hypoplastic, accounting for the
slightly diminutive left ICA is compared to the right. Grossly
normal anterior communicating artery complex. Widely patent azygos
ACA noted. No definite M1 stenosis or occlusion, although evaluation
limited by motion. Grossly negative MCA bifurcations. Distal MCA
branches well perfused and fairly symmetric.

POSTERIOR CIRCULATION:

Visualized vertebral arteries are largely code dominant and patent
to the vertebrobasilar junction. Both PICA origins are patent and
normal. Basilar patent to its distal aspect without stenosis.
Superior cerebral arteries grossly patent proximally. Both PCAs
appear to be primarily supplied via the basilar and grossly perfused
to their distal aspects without stenosis. Probable small bilateral
posterior communicating arteries noted.

No intracranial aneurysm.  No other visible vascular malformation.
IMPRESSION: MRI HEAD IMPRESSION:

1. Constellation of findings consistent with history of Sturge-Weber
syndrome involving the left parieto-occipital region as detailed
above.
2. Small right mastoid effusion. Otherwise negative IAC protocol MRI
of the brain. No other structural findings to explain patient's
symptoms identified.
3. Empty sella, progressed from previous.

MRA HEAD IMPRESSION:

1. Motion degraded exam.
2. Grossly negative intracranial MRA. No large vessel occlusion,
hemodynamically significant stenosis, or other significant vascular
abnormality about the medium and large sized vessels of the
intracranial arterial circulation. No aneurysm.
3. Azygos ACA.

## 2023-01-04 ENCOUNTER — Emergency Department (HOSPITAL_COMMUNITY): Payer: 59

## 2023-01-04 ENCOUNTER — Emergency Department (HOSPITAL_COMMUNITY)
Admission: EM | Admit: 2023-01-04 | Discharge: 2023-01-04 | Disposition: A | Discharge status: Home / Self Care | Payer: 59 | Attending: Emergency Medicine | Admitting: Emergency Medicine

## 2023-01-04 ENCOUNTER — Ambulatory Visit (HOSPITAL_COMMUNITY)
Admission: EM | Admit: 2023-01-04 | Discharge: 2023-01-04 | Disposition: A | Discharge status: Home / Self Care | Payer: 59 | Attending: Internal Medicine | Admitting: Internal Medicine

## 2023-01-04 ENCOUNTER — Encounter (HOSPITAL_COMMUNITY): Payer: Self-pay

## 2023-01-04 ENCOUNTER — Other Ambulatory Visit: Payer: Self-pay

## 2023-01-04 DIAGNOSIS — R519 Headache, unspecified: Secondary | ICD-10-CM | POA: Diagnosis not present

## 2023-01-04 DIAGNOSIS — G43011 Migraine without aura, intractable, with status migrainosus: Secondary | ICD-10-CM

## 2023-01-04 DIAGNOSIS — Z91040 Latex allergy status: Secondary | ICD-10-CM | POA: Diagnosis not present

## 2023-01-04 DIAGNOSIS — R110 Nausea: Secondary | ICD-10-CM

## 2023-01-04 DIAGNOSIS — H53149 Visual discomfort, unspecified: Secondary | ICD-10-CM | POA: Insufficient documentation

## 2023-01-04 LAB — CBC WITH DIFFERENTIAL/PLATELET
Abs Immature Granulocytes: 0.02 10*3/uL (ref 0.00–0.07)
Basophils Absolute: 0 10*3/uL (ref 0.0–0.1)
Basophils Relative: 0 %
Eosinophils Absolute: 0 10*3/uL (ref 0.0–0.5)
Eosinophils Relative: 0 %
HCT: 43.2 % (ref 36.0–46.0)
Hemoglobin: 13.6 g/dL (ref 12.0–15.0)
Immature Granulocytes: 0 %
Lymphocytes Relative: 23 %
Lymphs Abs: 1.6 10*3/uL (ref 0.7–4.0)
MCH: 27.6 pg (ref 26.0–34.0)
MCHC: 31.5 g/dL (ref 30.0–36.0)
MCV: 87.6 fL (ref 80.0–100.0)
Monocytes Absolute: 0.2 10*3/uL (ref 0.1–1.0)
Monocytes Relative: 3 %
Neutro Abs: 5.3 10*3/uL (ref 1.7–7.7)
Neutrophils Relative %: 74 %
Platelets: 382 10*3/uL (ref 150–400)
RBC: 4.93 MIL/uL (ref 3.87–5.11)
RDW: 15.1 % (ref 11.5–15.5)
WBC: 7.1 10*3/uL (ref 4.0–10.5)
nRBC: 0 % (ref 0.0–0.2)

## 2023-01-04 LAB — BASIC METABOLIC PANEL
Anion gap: 8 (ref 5–15)
BUN: 5 mg/dL — ABNORMAL LOW (ref 6–20)
CO2: 22 mmol/L (ref 22–32)
Calcium: 8.8 mg/dL — ABNORMAL LOW (ref 8.9–10.3)
Chloride: 107 mmol/L (ref 98–111)
Creatinine, Ser: 0.69 mg/dL (ref 0.44–1.00)
GFR, Estimated: >60 mL/min (ref >60)
Glucose, Bld: 102 mg/dL — ABNORMAL HIGH (ref 70–99)
Potassium: 4.7 mmol/L (ref 3.5–5.1)
Sodium: 137 mmol/L (ref 135–145)

## 2023-01-04 MED ORDER — SODIUM CHLORIDE 0.9 % IV BOLUS
1000.0000 mL | Freq: Once | INTRAVENOUS | Status: AC
  Administered 2023-01-04: 1000 mL via INTRAVENOUS

## 2023-01-04 MED ORDER — METOCLOPRAMIDE HCL 5 MG/ML IJ SOLN
INTRAMUSCULAR | Status: AC
  Filled 2023-01-04: qty 2

## 2023-01-04 MED ORDER — MAGNESIUM SULFATE 2 GM/50ML IV SOLN
2.0000 g | Freq: Once | INTRAVENOUS | Status: AC
  Administered 2023-01-04: 2 g via INTRAVENOUS
  Filled 2023-01-04: qty 50

## 2023-01-04 MED ORDER — SUMATRIPTAN SUCCINATE 6 MG/0.5ML ~~LOC~~ SOLN: 6.0000 mg | Freq: Once | SUBCUTANEOUS | Status: DC

## 2023-01-04 MED ORDER — ONDANSETRON 4 MG PO TBDP
4.0000 mg | ORAL_TABLET | Freq: Once | ORAL | Status: AC
  Administered 2023-01-04: 4 mg via ORAL

## 2023-01-04 MED ORDER — ONDANSETRON 4 MG PO TBDP
ORAL_TABLET | ORAL | Status: AC
  Filled 2023-01-04: qty 1

## 2023-01-04 MED ORDER — KETOROLAC TROMETHAMINE 30 MG/ML IJ SOLN
30.0000 mg | Freq: Once | INTRAMUSCULAR | Status: AC
  Administered 2023-01-04: 30 mg via INTRAMUSCULAR

## 2023-01-04 MED ORDER — DEXAMETHASONE SODIUM PHOSPHATE 10 MG/ML IJ SOLN
10.0000 mg | Freq: Once | INTRAMUSCULAR | Status: AC
  Administered 2023-01-04: 10 mg via INTRAMUSCULAR

## 2023-01-04 MED ORDER — KETOROLAC TROMETHAMINE 30 MG/ML IJ SOLN
INTRAMUSCULAR | Status: AC
  Filled 2023-01-04: qty 1

## 2023-01-04 MED ORDER — MORPHINE SULFATE (PF) 4 MG/ML IV SOLN
4.0000 mg | Freq: Once | INTRAVENOUS | Status: AC
  Administered 2023-01-04: 4 mg via INTRAVENOUS
  Filled 2023-01-04: qty 1

## 2023-01-04 MED ORDER — METOCLOPRAMIDE HCL 5 MG/ML IJ SOLN
5.0000 mg | Freq: Once | INTRAMUSCULAR | Status: AC
  Administered 2023-01-04: 5 mg via INTRAMUSCULAR

## 2023-01-04 MED ORDER — DEXAMETHASONE SODIUM PHOSPHATE 10 MG/ML IJ SOLN
INTRAMUSCULAR | Status: AC
  Filled 2023-01-04: qty 1

## 2023-01-04 NOTE — ED Notes (Signed)
Patient is being discharged from the Urgent Care and sent to the Emergency Department via pov . Per Michelle,NP, patient is in need of higher level of care due to need of further evaluation. Patient is aware and verbalizes understanding of plan of care.  Vitals:   01/04/23 0826  BP: 123/84  Pulse: 75  Resp: 16  Temp: 98.6 F (37 C)  SpO2: 94%

## 2023-01-04 NOTE — ED Provider Notes (Signed)
[facility] AT [facility] Provider Note   CSN: [id number] Arrival date & time: 01/04/23  1018     History  Chief Complaint  Patient presents with   Migraine    Gina Cameron is a 37 y.o. female.  With history of chronic migraines, pineal gland cyst, seizure disorder, anxiety, depression, bipolar 2 disorder, PTSD, Sturge-Weber syndrome, sleep apnea who presents to the ED for evaluation of headache.  She initially presented to urgent care and had intramuscular injections of medications but after 30 minutes stated that her headache is not improving also she was sent to the emergency department for further evaluation.  The pain is localized to the behind her eyes and in her frontal and maxillary sinuses as well as the entire left side of her face.  She states the pain has gotten progressively worse over the last 2 days.  It was not sudden in onset.  Feels like her typical migraines but more painful.  She reports compliance with her preventative Topamax.  She states she took her Bernita Raisin but it did not work.  She reports nausea but no vomiting.  No vision changes.  No numbness, weakness or tingling.  No fevers or neck stiffness.  She endorses photophobia and phonophobia.  She states she has been getting migraines since she was 16.   Migraine Associated symptoms include headaches.       Home Medications Prior to Admission medications   Medication Sig Start Date End Date Taking? Authorizing Provider  amitriptyline (ELAVIL) 10 MG tablet TAKE 1 TABLET BY MOUTH EVERY NIGHT AT BEDTIME 07/31/20   Zola Button, Grayling Congress, DO  Cetirizine HCl (ZYRTEC ALLERGY) 10 MG CAPS Take 1 capsule (10 mg total) by mouth in the morning and at bedtime. 05/29/20   Alfonse Spruce, MD  EPINEPHrine 0.3 mg/0.3 mL IJ SOAJ injection SMARTSIG:0.3 Milligram(s) IM Once 05/29/20   [provider]  FLUoxetine (PROZAC) 20 MG capsule Take 1 capsule (20 mg total) by mouth daily. 01/15/20    Donato Schultz, DO  FLUoxetine (PROZAC) 20 MG capsule Take by mouth. 01/08/21   [provider]  furosemide (LASIX) 40 MG tablet Take 1 tablet (40 mg total) by mouth daily. 10/02/19   Donato Schultz, DO  hydrochlorothiazide (HYDRODIURIL) 25 MG tablet Take 25 mg by mouth daily. 01/21/21   [provider]  levocetirizine (XYZAL) 5 MG tablet Take by mouth. 01/08/21   [provider]  metFORMIN (GLUCOPHAGE) 500 MG tablet Take 500 mg by mouth 2 (two) times daily. 12/29/20   [provider]  montelukast (SINGULAIR) 10 MG tablet Take 1 tablet (10 mg total) by mouth at bedtime. 01/15/20   Donato Schultz, DO  omeprazole (PRILOSEC) 20 MG capsule TAKE 1 CAPSULE BY MOUTH EVERY DAY 08/04/20   Zola Button, Grayling Congress, DO  ondansetron (ZOFRAN ODT) 4 MG disintegrating tablet Take 1 tablet (4 mg total) by mouth every 8 (eight) hours as needed for nausea or vomiting. 03/05/21   Raspet, Noberto Retort, PA-C  promethazine (PHENERGAN) 25 MG tablet Take 0.5-1 tablets (12.5-25 mg total) by mouth every 8 (eight) hours as needed for nausea or vomiting. 03/07/20   Georgetta Haber, NP  VENTOLIN HFA 108 (90 Base) MCG/ACT inhaler INHALE 2 PUFFS INTO THE LUNGS EVERY 6 HOURS AS NEEDED FOR WHEEZING OR SHORTNESS OF BREATH 09/15/20   Alfonse Spruce, MD  Vitamin D, Ergocalciferol, (DRISDOL) 1.25 MG (50000 UNIT) CAPS capsule Take 1  capsule (50,000 Units total) by mouth every 7 (seven) days. 08/27/20   Opalski, Gavin Pound, DO      Allergies    Diphenhydramine-zinc acetate, Lidocaine-epinephrine, Mepivacaine hcl, Diphenhydramine hcl, Grass extracts [gramineae pollens], Molds & smuts, Other, Penicillins, and Latex    Review of Systems   Review of Systems  Neurological:  Positive for headaches.  All other systems reviewed and are negative.   Physical Exam Updated Vital Signs BP 112/71 (BP Location: Right Arm)   Pulse 84   Temp 98 F (36.7 C) (Oral)   Resp 15   SpO2 98%  Physical  Exam Vitals and nursing note reviewed.  Constitutional:      General: She is not in acute distress.    Appearance: She is well-developed. She is not ill-appearing, toxic-appearing or diaphoretic.  HENT:     Head: Normocephalic and atraumatic.  Eyes:     Conjunctiva/sclera: Conjunctivae normal.  Cardiovascular:     Rate and Rhythm: Normal rate and regular rhythm.     Heart sounds: No murmur heard. Pulmonary:     Effort: Pulmonary effort is normal. No respiratory distress.     Breath sounds: Normal breath sounds.  Abdominal:     Palpations: Abdomen is soft.     Tenderness: There is no abdominal tenderness.  Musculoskeletal:        General: No swelling.     Cervical back: Neck supple.  Skin:    General: Skin is warm and dry.     Capillary Refill: Capillary refill takes less than 2 seconds.  Neurological:     General: No focal deficit present.     Mental Status: She is alert and oriented to person, place, and time.     Comments:   MENTAL STATUS: AAOx3   LANG/SPEECH: Fluent, intact naming, repetition & comprehension   CRANIAL NERVES:   II: Pupils equal and reactive   III, IV, VI: EOM intact, no gaze preference or deviation, no nystagmus   V: normal sensation of the face   VII: no facial asymmetry   VIII: normal hearing to speech   MOTOR: 5/5 in both upper and lower extremities   SENSORY: Normal to touch in all extremiteis   COORD: Normal finger to nose, heel to shin and shoulder shrug, no tremor, no dysmetria. No pronator drift   Psychiatric:        Mood and Affect: Mood normal.        Behavior: Behavior normal.     ED Results / Procedures / Treatments   Labs (all labs ordered are listed, but only abnormal results are displayed) Labs Reviewed  BASIC METABOLIC PANEL - Abnormal; Notable for the following components:      Result Value   Glucose, Bld 102 (*)    BUN 5 (*)    Calcium 8.8 (*)    All other components within normal limits  CBC WITH DIFFERENTIAL/PLATELET     EKG None  Radiology CT Head Wo Contrast  Result Date: 01/04/2023 CLINICAL DATA:  Headache with increasing frequency or severity. Migraine for 2 days. EXAM: CT HEAD WITHOUT CONTRAST TECHNIQUE: Contiguous axial images were obtained from the base of the skull through the vertex without intravenous contrast. RADIATION DOSE REDUCTION: This exam was performed according to the departmental dose-optimization program which includes automated exposure control, adjustment of the mA and/or kV according to patient size and/or use of iterative reconstruction technique. COMPARISON:  Brain MRI 05/01/2020 FINDINGS: Brain: History of Sturge-Weber with dense cortical calcification involving the left  parietal and occipital lobes primarily. No evidence of superimposed infarct, hemorrhage, hydrocephalus, or collection. Chronic partially empty sella. Vascular: No hyperdense vessel or unexpected calcification. Skull: Normal. Negative for fracture or focal lesion. Sinuses/Orbits: No acute finding. IMPRESSION: 1. No acute or reversible finding. 2. Left cerebral calcification correlating with history of Sturge-Weber. Electronically Signed   By: Tiburcio Pea M.D.   On: 01/04/2023 13:14    Procedures Procedures    Medications Ordered in ED Medications  sodium chloride 0.9 % bolus 1,000 mL (0 mLs Intravenous Stopped 01/04/23 1340)  morphine (PF) 4 MG/ML injection 4 mg (4 mg Intravenous Given 01/04/23 1225)  magnesium sulfate IVPB 2 g 50 mL (0 g Intravenous Stopped 01/04/23 1340)    ED Course/ Medical Decision Making/ A&P                             Medical Decision Making This patient presents to the ED for concern of headache, this involves an extensive number of treatment options, and is a complaint that carries with it a high risk of complications and morbidity.  Emergent considerations for headache include subarachnoid hemorrhage, meningitis, temporal arteritis, glaucoma, cerebral ischemia, carotid/vertebral  dissection, intracranial tumor, Venous sinus thrombosis, carbon monoxide poisoning, acute or chronic subdural hemorrhage.  Other considerations include: Migraine, Cluster headache, Hypertension, Caffeine, alcohol, or drug withdrawal, Pseudotumor cerebri, Arteriovenous malformation, Head injury, Neurocysticercosis, Post-lumbar puncture, Preeclampsia, Tension headache, Sinusitis, Cervical arthritis, Refractive error causing strain, Dental abscess, Otitis media, Temporomandibular joint syndrome, Depression, Somatoform disorder (eg, somatization) Trigeminal neuralgia, Glossopharyngeal neuralgia.   Co morbidities that complicate the patient evaluation  chronic migraines, pineal gland cyst, seizure disorder, anxiety, depression, bipolar 2 disorder, PTSD, Sturge-Weber syndrome, sleep apnea  My initial workup includes  Additional history obtained from: Nursing notes from this visit. Previous records within EMR system: Urgent care visit just prior to arrival.  Neurology visit on 12/13/2022 for chronic migraines, idiopathic intracranial hypertension.  Her Topamax was increased.  She was scheduled for her routine Botox injections for headaches, she was prescribed Ubrelvy  I ordered, reviewed and interpreted labs which include: BMP, CBC.  Labs within normal limits  I ordered imaging studies including CT head I independently visualized and interpreted imaging which showed no acute findings, chronic findings consistent with Sturge-Weber syndrome I agree with the radiologist interpretation  Afebrile, hemodynamically stable.  37 year old female presenting to the ED for evaluation of headache.  She has a chronic history of headaches.  She sees neurology for this and is on numerous medications, both preventative and abortive.  States this has not worked and she has had a headache for the past 2 days.  She denies any red flag symptoms including fevers, neck stiffness, sudden onset, worst at onset.  She appears fairly  well on physical exam.  She initially appeared uncomfortable but had a normal neurologic exam.  She was treated with medications in urgent care prior to arrival.  States these did not help.  Then treated with magnesium, morphine and fluids and reported significant improvement in her symptoms.  CT was negative for acute abnormalities.  Overall suspect this is likely another acute on chronic migraine.  She has an appointment with her neurologist tomorrow.  She was encouraged to keep this appointment.  She also has an appointment with neurosurgery in the near future.  She was given return precautions.  Stable at discharge.  At this time there does not appear to be any evidence of an  acute emergency medical condition and the patient appears stable for discharge with appropriate outpatient follow up. Diagnosis was discussed with patient who verbalizes understanding of care plan and is agreeable to discharge. I have discussed return precautions with patient who verbalizes understanding. Patient encouraged to follow-up with their PCP within 1 week. All questions answered.  Patient's case discussed with Dr. Jeraldine Loots who agrees with plan to discharge with follow-up.   Note: Portions of this report may have been transcribed using voice recognition software. Every effort was made to ensure accuracy; however, inadvertent computerized transcription errors may still be present.        Final Clinical Impression(s) / ED Diagnoses Final diagnoses:  Bad headache    Rx / DC Orders ED Discharge Orders     None         Mora Bellman 01/04/23 1439    Gerhard Munch, MD 01/04/23 [phone]

## 2023-01-04 NOTE — Discharge Instructions (Signed)
You have been seen today for your complaint of headache. Your lab work was reassuring. Your imaging was reassuring. Follow up with: Your neurologist tomorrow as scheduled Please seek immediate medical care if you develop any of the following symptoms: Your migraine becomes really bad and medicine does not help. You have a fever or stiff neck. You have trouble seeing. Your muscles are weak or you lose control of them. You lose your balance or have trouble walking. You feel like you may faint or you faint. You start having sudden, very bad headaches. You have a seizure. At this time there does not appear to be the presence of an emergent medical condition, however there is always the potential for conditions to change. Please read and follow the below instructions.  Do not take your medicine if  develop an itchy rash, swelling in your mouth or lips, or difficulty breathing; call 911 and seek immediate emergency medical attention if this occurs.  You may review your lab tests and imaging results in their entirety on your MyChart account.  Please discuss all results of fully with your primary care provider and other specialist at your follow-up visit.  Note: Portions of this text may have been transcribed using voice recognition software. Every effort was made to ensure accuracy; however, inadvertent computerized transcription errors may still be present.

## 2023-01-04 NOTE — Discharge Instructions (Addendum)
I would like for you to go to the nearest ER for further workup and evaluation since your headache has not responded well to the typical migraine treatment here at urgent care.  I am concerned that your headache may be indicating that there is a different problem going on it your brain and would like for this to be worked up in the ER.

## 2023-01-04 NOTE — ED Provider Notes (Signed)
[facility]    CSN: [id number] Arrival date & time: 01/04/23  0802      History   Chief Complaint Chief Complaint  Patient presents with   Migraine    HPI Gina Cameron is a 37 y.o. female.   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  Headache is to the bilateral forehead and the left side of the face, currently 10 out of 10 on the pain scale and constant. Reports photophobia, phonophobia, nausea without vomiting, and "slight room spinning sensation". No paresthesias to the extremities, extremity weakness, neck pain, rash, abdominal pain, urinary symptoms, or recent head trauma. No vision changes. History of migraines, has been getting migraines more frequently for the last 3 months. She has a migraine 2 times a month and receives Botox injections every 3 months or so with neurology. Her next appointment for Botox injection for headaches is in 3 days on 01/07/23. She took some medicine prescribed by PCP yesterday for headache and states this made her fall asleep and headache was worse when she woke up. She has not had any medication today to help with migraine.  Denies chance of pregnancy. Receives depo injection, last depo was in February 2024. She is not currently sexually active and had a negative pregnancy test recently at home.   Migraine    Past Medical History:  Diagnosis Date   Anxiety    Asthma    Bipolar 1 disorder (HCC)    Bipolar 2 disorder (HCC)    Constipation    Depression    Fatty liver    Gallbladder problem    GERD (gastroesophageal reflux disease)    Hives    Joint pain    Leg edema    Migraine    PTSD (post-traumatic stress disorder)    Reflux    Seizures (HCC)    Sleep apnea    Sturge-Weber syndrome (HCC)    Swelling of both lower extremities     Patient Active Problem List   Diagnosis Date Noted   Secondary  amenorrhea 09/03/2020   Water retention 08/27/2020   At risk for diabetes mellitus 08/03/2020   Nonalcoholic hepatosteatosis 07/29/2020   Lower extremity edema 07/29/2020   Other hyperlipidemia 07/29/2020   Vitamin D deficiency 07/29/2020   Prediabetes 07/29/2020   Mood disorder (HCC) 07/15/2020   Reactive airway disease without complication 07/15/2020   Other fatigue 07/15/2020   SOBOE (shortness of breath on exertion) 07/15/2020   At risk for depression 07/15/2020   Depression, major, single episode, mild (HCC) 07/14/2020   Fatty liver 07/14/2020   Seasonal and perennial allergic rhinitis 02/29/2020   Mild persistent asthma, uncomplicated 02/29/2020   Seasonal allergies 01/15/2020   Pelvic pain 01/15/2020   Abdominal bloating 01/15/2020   Swelling 01/15/2020   Bipolar depression (HCC) 10/17/2019   Allergies 10/17/2019   Class 3 severe obesity with serious comorbidity and body mass index (BMI) of 40.0 to 44.9 in adult (HCC) 10/17/2019   Developmental delay 02/23/2018   Seizures (HCC) 02/23/2018   Hirsutism 02/23/2018   Cyst of ovary 02/23/2018   Complex tear of medial meniscus of right knee as current injury 03/01/2016   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   History of brain disorder 08/06/2014   Migraine    GERD 01/14/2010   OBSTRUCTIVE SLEEP APNEA 10/30/2009   Migraine without aura 06/10/2009   ECZEMA 03/05/2009   Depression with  anxiety 07/24/2008   MILD MENTAL RETARDATION 01/15/2008   Sturge-Weber syndrome (HCC) 01/15/2008   Congenital hamartoma (HCC) 01/15/2008   MORBID OBESITY 01/10/2008   Flexural atopic dermatitis 11/30/2006    Past Surgical History:  Procedure Laterality Date   CHOLECYSTECTOMY     OVARIAN CYST REMOVAL     WISDOM TOOTH EXTRACTION      OB History     Gravida  0   Para  0   Term  0   Preterm  0   AB  0   Living  0      SAB  0   IAB  0   Ectopic  0   Multiple  0   Live Births  0            Home  Medications    Prior to Admission medications   Medication Sig Start Date End Date Taking? Authorizing Provider  amitriptyline (ELAVIL) 10 MG tablet TAKE 1 TABLET BY MOUTH EVERY NIGHT AT BEDTIME 07/31/20  Yes Seabron Spates R, DO  Cetirizine HCl (ZYRTEC ALLERGY) 10 MG CAPS Take 1 capsule (10 mg total) by mouth in the morning and at bedtime. 05/29/20  Yes Alfonse Spruce, MD  FLUoxetine (PROZAC) 20 MG capsule Take 1 capsule (20 mg total) by mouth daily. 01/15/20  Yes Donato Schultz, DO  FLUoxetine (PROZAC) 20 MG capsule Take by mouth. 01/08/21  Yes [provider]  furosemide (LASIX) 40 MG tablet Take 1 tablet (40 mg total) by mouth daily. 10/02/19  Yes Seabron Spates R, DO  hydrochlorothiazide (HYDRODIURIL) 25 MG tablet Take 25 mg by mouth daily. 01/21/21  Yes [provider]  levocetirizine (XYZAL) 5 MG tablet Take by mouth. 01/08/21  Yes [provider]  metFORMIN (GLUCOPHAGE) 500 MG tablet Take 500 mg by mouth 2 (two) times daily. 12/29/20  Yes [provider]  omeprazole (PRILOSEC) 20 MG capsule TAKE 1 CAPSULE BY MOUTH EVERY DAY 08/04/20  Yes Lowne Chase, Yvonne R, DO  VENTOLIN HFA 108 (90 Base) MCG/ACT inhaler INHALE 2 PUFFS INTO THE LUNGS EVERY 6 HOURS AS NEEDED FOR WHEEZING OR SHORTNESS OF BREATH 09/15/20  Yes Alfonse Spruce, MD  Vitamin D, Ergocalciferol, (DRISDOL) 1.25 MG (50000 UNIT) CAPS capsule Take 1 capsule (50,000 Units total) by mouth every 7 (seven) days. 08/27/20  Yes Opalski, Gavin Pound, DO  EPINEPHrine 0.3 mg/0.3 mL IJ SOAJ injection SMARTSIG:0.3 Milligram(s) IM Once 05/29/20   [provider]  montelukast (SINGULAIR) 10 MG tablet Take 1 tablet (10 mg total) by mouth at bedtime. 01/15/20   Donato Schultz, DO  ondansetron (ZOFRAN ODT) 4 MG disintegrating tablet Take 1 tablet (4 mg total) by mouth every 8 (eight) hours as needed for nausea or vomiting. 03/05/21   Raspet, Noberto Retort, PA-C  promethazine (PHENERGAN) 25 MG  tablet Take 0.5-1 tablets (12.5-25 mg total) by mouth every 8 (eight) hours as needed for nausea or vomiting. 03/07/20   Georgetta Haber, NP    Family History Family History  Problem Relation Age of Onset   Hypertension Father    Hyperlipidemia Father    Depression Father    Bipolar disorder Father    Hypertension Mother    Heart disease Mother    Cancer Mother    Hypertension Maternal Grandmother    Diabetes Maternal Grandmother    Hyperlipidemia Maternal Grandmother     Social History Social History   Tobacco Use   Smoking status: Former  Current packs/day: 0.00    Average packs/day: 0.1 packs/day for 2.0 years (0.2 ttl pk-yrs)    Types: Cigarettes    Start date: 09/07/2010    Quit date: 09/06/2012    Years since quitting: 10.3   Smokeless tobacco: Never  Vaping Use   Vaping status: Never Used  Substance Use Topics   Alcohol use: Not Currently    Comment: Socially   Drug use: Not Currently    Types: Marijuana     Allergies   Diphenhydramine-zinc acetate, Lidocaine-epinephrine, Mepivacaine hcl, Diphenhydramine hcl, Grass extracts [gramineae pollens], Molds & smuts, Other, Penicillins, and Latex   Review of Systems Review of Systems Per HPI  Physical Exam Triage Vital Signs ED Triage Vitals  Encounter Vitals Group     BP 01/04/23 0826 123/84     Systolic BP Percentile --      Diastolic BP Percentile --      Pulse Rate 01/04/23 0826 75     Resp 01/04/23 0826 16     Temp 01/04/23 0826 98.6 F (37 C)     Temp Source 01/04/23 0826 Oral     SpO2 01/04/23 0826 94 %     Weight 01/04/23 0826 262 lb (118.8 kg)     Height 01/04/23 0826 5\' 2"  (1.575 m)     Head Circumference --      Peak Flow --      Pain Score 01/04/23 0825 10     Pain Loc --      Pain Education --      Exclude from Growth Chart --    No data found.  Updated Vital Signs BP 123/84 (BP Location: Right Arm)   Pulse 75   Temp 98.6 F (37 C) (Oral)   Resp 16   Ht 5\' 2"  (1.575 m)   Wt  262 lb (118.8 kg)   SpO2 94%   BMI 47.92 kg/m   Visual Acuity Right Eye Distance:   Left Eye Distance:   Bilateral Distance:    Right Eye Near:   Left Eye Near:    Bilateral Near:     Physical Exam Vitals and nursing note reviewed.  Constitutional:      Appearance: She is ill-appearing. She is not toxic-appearing.  HENT:     Head: Normocephalic and atraumatic.     Right Ear: Hearing, tympanic membrane, ear canal and external ear normal.     Left Ear: Hearing, tympanic membrane, ear canal and external ear normal.     Nose: Nose normal.     Mouth/Throat:     Lips: Pink.     Mouth: Mucous membranes are moist. No injury.     Tongue: No lesions. Tongue does not deviate from midline.     Palate: No mass and lesions.     Pharynx: Oropharynx is clear. Uvula midline. No pharyngeal swelling, oropharyngeal exudate, posterior oropharyngeal erythema or uvula swelling.     Tonsils: No tonsillar exudate or tonsillar abscesses.  Eyes:     General: Lids are normal. Vision grossly intact. Gaze aligned appropriately.        Right eye: No discharge.        Left eye: No discharge.     Extraocular Movements: Extraocular movements intact.     Conjunctiva/sclera: Conjunctivae normal.     Pupils: Pupils are equal, round, and reactive to light.  Cardiovascular:     Rate and Rhythm: Normal rate and regular rhythm.     Heart sounds: Normal heart sounds, S1  normal and S2 normal.  Pulmonary:     Effort: Pulmonary effort is normal. No respiratory distress.     Breath sounds: Normal breath sounds and air entry.  Musculoskeletal:     Cervical back: Neck supple.  Skin:    General: Skin is warm and dry.     Capillary Refill: Capillary refill takes less than 2 seconds.     Findings: No rash.  Neurological:     General: No focal deficit present.     Mental Status: She is alert and oriented to person, place, and time. Mental status is at baseline.     Cranial Nerves: Cranial nerves 2-12 are intact. No  dysarthria or facial asymmetry.     Sensory: Sensation is intact.     Motor: Motor function is intact. No weakness.     Coordination: Coordination is intact.     Gait: Gait is intact. Gait normal.     Comments: Strength and sensation intact to bilateral upper and lower extremities (5/5). Moves all 4 extremities with normal coordination voluntarily. Non-focal neuro exam.   Psychiatric:        Mood and Affect: Mood normal.        Speech: Speech normal.        Behavior: Behavior normal.        Thought Content: Thought content normal.        Judgment: Judgment normal.      UC Treatments / Results  Labs (all labs ordered are listed, but only abnormal results are displayed) Labs Reviewed - No data to display  EKG   Radiology No results found.  Procedures Procedures (including critical care time)  Medications Ordered in UC Medications  ketorolac (TORADOL) 30 MG/ML injection 30 mg (30 mg Intramuscular Given 01/04/23 0908)  metoCLOPramide (REGLAN) injection 5 mg (5 mg Intramuscular Given 01/04/23 0908)  dexamethasone (DECADRON) injection 10 mg (10 mg Intramuscular Given 01/04/23 0908)  ondansetron (ZOFRAN-ODT) disintegrating tablet 4 mg (4 mg Oral Given 01/04/23 0909)    Initial Impression / Assessment and Plan / UC Course  I have reviewed the triage vital signs and the nursing notes.  Pertinent labs & imaging results that were available during my care of the patient were reviewed by me and considered in my medical decision making (see chart for details).   1. Severe headache, intractable migraine without aura with status migrainosus, nausea without vomiting  Patient placed in cool dark room and given headache cocktail (ketorolac 30mg  IM, decadron 10mg  IM, reglan 5mg  IM). Patient laying supine on exam table with warm blanket in position of comfort to allow headache cocktail to work.   On re-check 30 minutes after medication administration, patient reports no improvement in headache  but reports improvement in dizziness sensation. States it "feels like someone is hitting my head over and over with a sledge hammer" and headache remains 10/10 without any improvement. She reports headache usually "responds to medicines by now" when given headache cocktail but this time it is different and there is no improvement.   Given lack of improvement/response to medication and patient's PMH of neurologic abnormalities, recommend further workup in the ER to rule out intracranial abnormality etiology of headache.  Discussed this with patient who expresses understanding and agreement with plan. Vital signs stable, neurologically intact to baseline, therefore may go to ER via POV.   Final Clinical Impressions(s) / UC Diagnoses   Final diagnoses:  Nausea without vomiting  Severe headache  Intractable migraine without aura and with status  migrainosus     Discharge Instructions      I would like for you to go to the nearest ER for further workup and evaluation since your headache has not responded well to the typical migraine treatment here at urgent care.  I am concerned that your headache may be indicating that there is a different problem going on it your brain and would like for this to be worked up in the ER.      ED Prescriptions   None    PDMP not reviewed this encounter.   Carlisle Beers, [place] 01/04/23 1002

## 2023-01-04 NOTE — ED Triage Notes (Signed)
Pt. Stated, I went to UC cause I have a headache that started yesterday and they gave me a migraine cocktail which usually akes care of it , today it didn't do anything so they sent me down here for further work up .

## 2023-01-04 NOTE — ED Triage Notes (Signed)
Patient here today with c/o migraine X 2 days. She has been experiencing some dizziness, upset stomach, and photo sensitivity. She took something for pain that her PCP prescribed her but she doesn't know the name of it. It hasn't been helping.
